# Patient Record
Sex: Female | Born: 1980 | Race: White | Hispanic: No | Marital: Single | Smoking: Current every day smoker
Health system: Southern US, Community
[De-identification: ages and names within clinical notes are randomized; demographics above are authoritative.]

## PROBLEM LIST (undated history)

## (undated) DIAGNOSIS — E785 Hyperlipidemia, unspecified: Secondary | ICD-10-CM

## (undated) DIAGNOSIS — I10 Essential (primary) hypertension: Secondary | ICD-10-CM

## (undated) DIAGNOSIS — D649 Anemia, unspecified: Secondary | ICD-10-CM

## (undated) DIAGNOSIS — M1990 Unspecified osteoarthritis, unspecified site: Secondary | ICD-10-CM

## (undated) DIAGNOSIS — D571 Sickle-cell disease without crisis: Secondary | ICD-10-CM

## (undated) DIAGNOSIS — F419 Anxiety disorder, unspecified: Secondary | ICD-10-CM

## (undated) DIAGNOSIS — G709 Myoneural disorder, unspecified: Secondary | ICD-10-CM

## (undated) DIAGNOSIS — F32A Depression, unspecified: Secondary | ICD-10-CM

## (undated) DIAGNOSIS — K219 Gastro-esophageal reflux disease without esophagitis: Secondary | ICD-10-CM

## (undated) HISTORY — DX: Anxiety disorder, unspecified: F41.9

## (undated) HISTORY — DX: Hyperlipidemia, unspecified: E78.5

## (undated) HISTORY — DX: Anemia, unspecified: D64.9

## (undated) HISTORY — PX: NO PAST SURGERIES: SHX2092

## (undated) HISTORY — DX: Unspecified osteoarthritis, unspecified site: M19.90

## (undated) HISTORY — DX: Myoneural disorder, unspecified: G70.9

## (undated) HISTORY — DX: Sickle-cell disease without crisis: D57.1

## (undated) HISTORY — DX: Gastro-esophageal reflux disease without esophagitis: K21.9

## (undated) HISTORY — DX: Depression, unspecified: F32.A

---
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
## 2002-01-16 ENCOUNTER — Encounter: Payer: Self-pay | Admitting: Emergency Medicine

## 2002-01-16 ENCOUNTER — Emergency Department (HOSPITAL_COMMUNITY): Admission: EM | Admit: 2002-01-16 | Discharge: 2002-01-16 | Payer: Self-pay | Admitting: Emergency Medicine

## 2006-04-13 ENCOUNTER — Emergency Department: Payer: Self-pay | Admitting: Emergency Medicine

## 2006-06-16 ENCOUNTER — Emergency Department: Payer: Self-pay | Admitting: Unknown Physician Specialty

## 2006-10-31 ENCOUNTER — Emergency Department: Payer: Self-pay | Admitting: Unknown Physician Specialty

## 2007-11-24 ENCOUNTER — Emergency Department: Payer: Self-pay | Admitting: Emergency Medicine

## 2008-09-27 ENCOUNTER — Emergency Department: Payer: Self-pay | Admitting: Emergency Medicine

## 2009-08-10 ENCOUNTER — Emergency Department: Payer: Self-pay | Admitting: Emergency Medicine

## 2009-08-12 ENCOUNTER — Inpatient Hospital Stay: Payer: Self-pay | Admitting: Internal Medicine

## 2010-08-12 ENCOUNTER — Emergency Department: Payer: Self-pay | Admitting: Emergency Medicine

## 2010-11-16 ENCOUNTER — Emergency Department: Payer: Self-pay | Admitting: Emergency Medicine

## 2011-01-16 ENCOUNTER — Emergency Department (HOSPITAL_COMMUNITY)
Admission: EM | Admit: 2011-01-16 | Discharge: 2011-01-16 | Disposition: A | Discharge status: Home / Self Care | Payer: Self-pay | Attending: Emergency Medicine | Admitting: Emergency Medicine

## 2011-01-16 DIAGNOSIS — L0291 Cutaneous abscess, unspecified: Secondary | ICD-10-CM | POA: Insufficient documentation

## 2011-01-16 DIAGNOSIS — L0390 Cellulitis, unspecified: Secondary | ICD-10-CM | POA: Insufficient documentation

## 2011-02-23 ENCOUNTER — Emergency Department: Payer: Self-pay | Admitting: Emergency Medicine

## 2011-07-23 ENCOUNTER — Observation Stay: Payer: Self-pay

## 2011-09-29 ENCOUNTER — Observation Stay: Payer: Self-pay | Admitting: Obstetrics and Gynecology

## 2011-09-30 ENCOUNTER — Inpatient Hospital Stay: Payer: Self-pay | Admitting: Obstetrics and Gynecology

## 2011-10-10 ENCOUNTER — Emergency Department: Payer: Self-pay | Admitting: *Deleted

## 2013-01-15 ENCOUNTER — Emergency Department: Payer: Self-pay | Admitting: Emergency Medicine

## 2013-01-28 ENCOUNTER — Emergency Department: Payer: Self-pay | Admitting: Emergency Medicine

## 2013-01-28 LAB — URINALYSIS, COMPLETE
Bacteria: NONE SEEN
Bilirubin,UR: NEGATIVE
Blood: NEGATIVE
Glucose,UR: NEGATIVE mg/dL (ref 0–75)
Ketone: NEGATIVE
Nitrite: NEGATIVE
RBC,UR: 3 /HPF (ref 0–5)
Squamous Epithelial: 4
WBC UR: 35 /HPF (ref 0–5)

## 2013-01-28 LAB — COMPREHENSIVE METABOLIC PANEL WITH GFR
Albumin: 3.5 g/dL (ref 3.4–5.0)
Alkaline Phosphatase: 99 U/L (ref 50–136)
Anion Gap: 6 — ABNORMAL LOW (ref 7–16)
BUN: 7 mg/dL (ref 7–18)
Bilirubin,Total: 1.2 mg/dL — ABNORMAL HIGH (ref 0.2–1.0)
Calcium, Total: 8.6 mg/dL (ref 8.5–10.1)
Chloride: 102 mmol/L (ref 98–107)
Co2: 29 mmol/L (ref 21–32)
Creatinine: 0.61 mg/dL (ref 0.60–1.30)
EGFR (African American): >60
EGFR (Non-African Amer.): >60
Glucose: 84 mg/dL (ref 65–99)
Osmolality: 271 (ref 275–301)
Potassium: 3.3 mmol/L — ABNORMAL LOW (ref 3.5–5.1)
SGOT(AST): 20 U/L (ref 15–37)
SGPT (ALT): 22 U/L (ref 12–78)
Sodium: 137 mmol/L (ref 136–145)
Total Protein: 7.8 g/dL (ref 6.4–8.2)

## 2013-01-28 LAB — CBC
HCT: 38.5 % (ref 35.0–47.0)
HGB: 12.7 g/dL (ref 12.0–16.0)
MCH: 30 pg (ref 26.0–34.0)
MCHC: 33 g/dL (ref 32.0–36.0)
MCV: 91 fL (ref 80–100)
Platelet: 397 x10 3/mm 3 (ref 150–440)
RBC: 4.23 X10 6/mm 3 (ref 3.80–5.20)
RDW: 13.5 % (ref 11.5–14.5)
WBC: 21.4 x10 3/mm 3 — ABNORMAL HIGH (ref 3.6–11.0)

## 2013-01-28 LAB — LIPASE, BLOOD: Lipase: 144 U/L (ref 73–393)

## 2013-01-28 LAB — WET PREP, GENITAL

## 2013-10-12 IMAGING — CT CT ABD-PELV W/ CM
1 of 2 series · 15 of 32 positions shown, 19 images · non-contrast
Comparison: none

REASON FOR EXAM: (1) mid and lower abd pain w/ whhiter count 21,000; (2)
abd pain  No PO CONTRAST
COMMENTS:

PROCEDURE:     CT  - CT ABDOMEN / PELVIS  W  - January 28, 2013  [DATE]
RESULT:     CT abdomen and pelvis dated 01/28/2013.
TECHNIQUE: Helical 3 mm sections were obtained from the lung bases through
the pubic symphysis status post intravenous administration of 85 mL of
[id number].

[Series 2: 3mm soft tissue · axial · 0.65mm/px · z∈[-688,-268]mm · 15 of 154 slices shown, 19 images]
[im 7/154  soft-tissue]
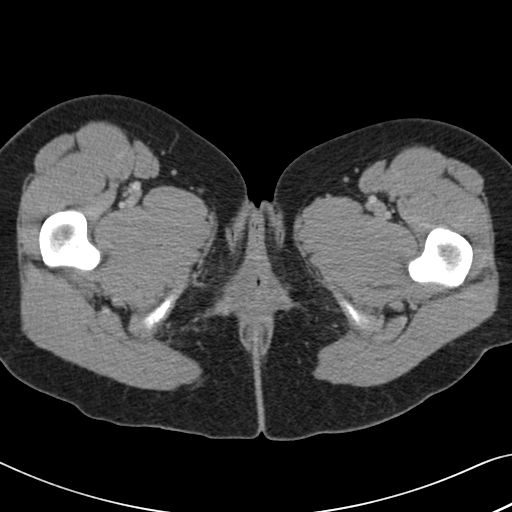
[im 7/154  bone]
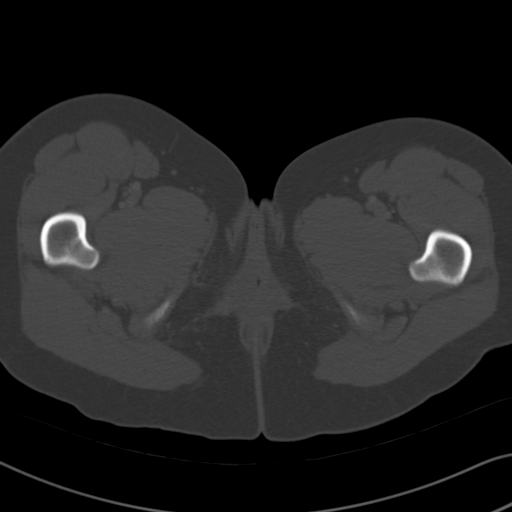
[im 20/154  soft-tissue]
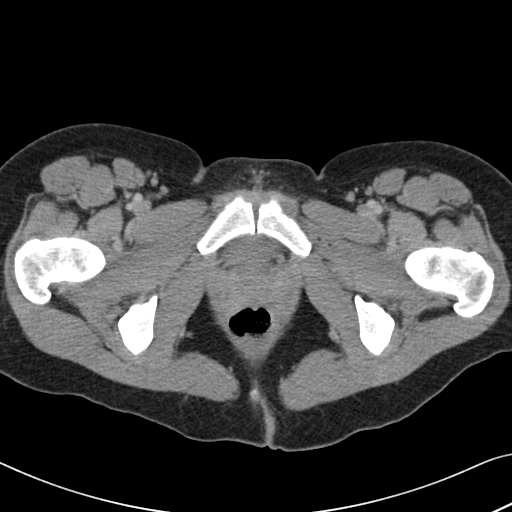
[im 32/154  soft-tissue]
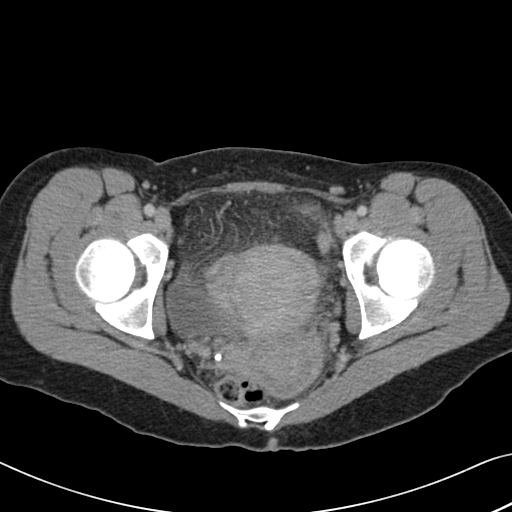
[im 45/154  soft-tissue]
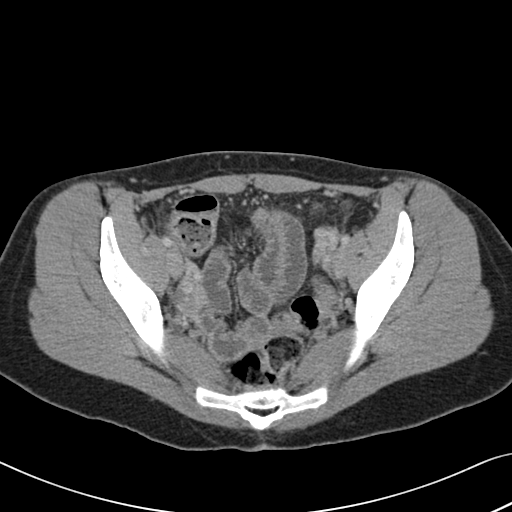
[im 52/154  soft-tissue]
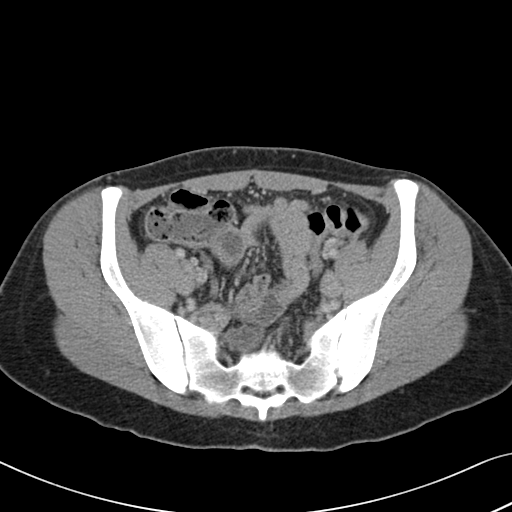
[im 64/154  soft-tissue]
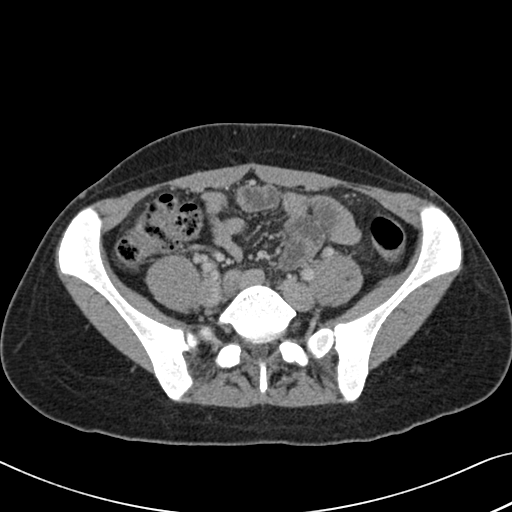
[im 77/154  soft-tissue]
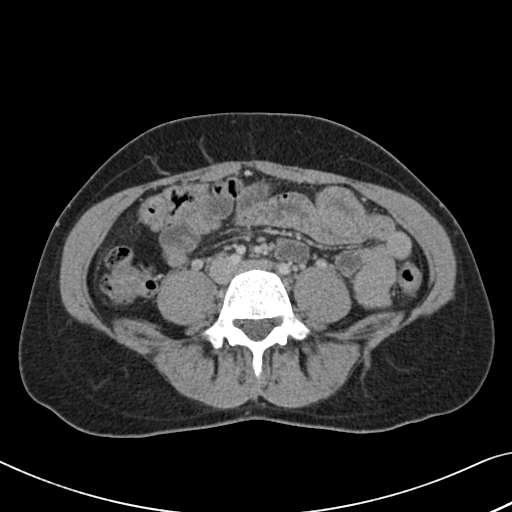
[im 90/154  soft-tissue]
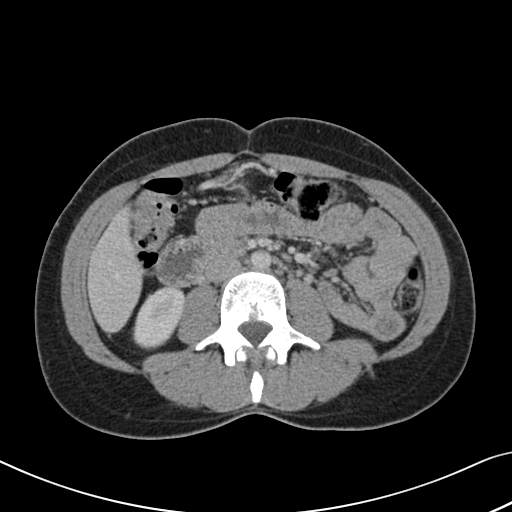
[im 103/154  soft-tissue]
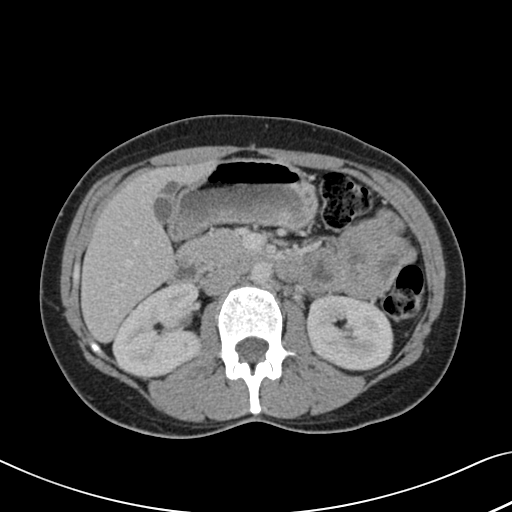
[im 103/154  bone]
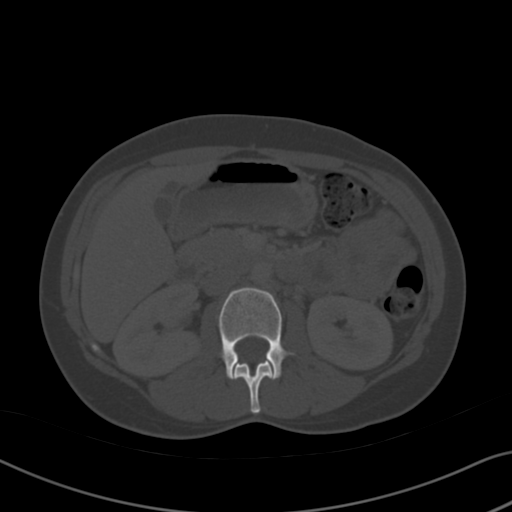
[im 109/154  soft-tissue]
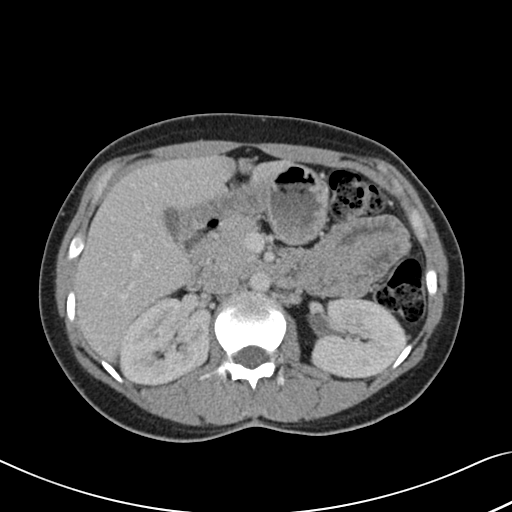
[im 122/154  soft-tissue]
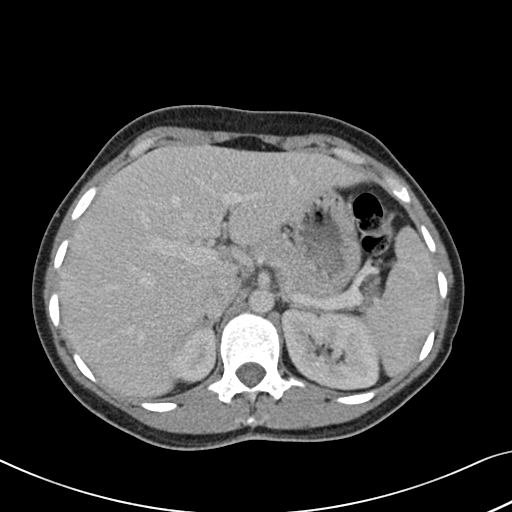
[im 128/154  lung]
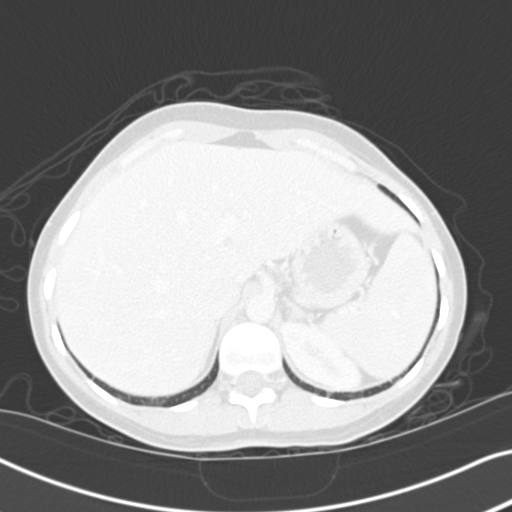
[im 134/154  soft-tissue]
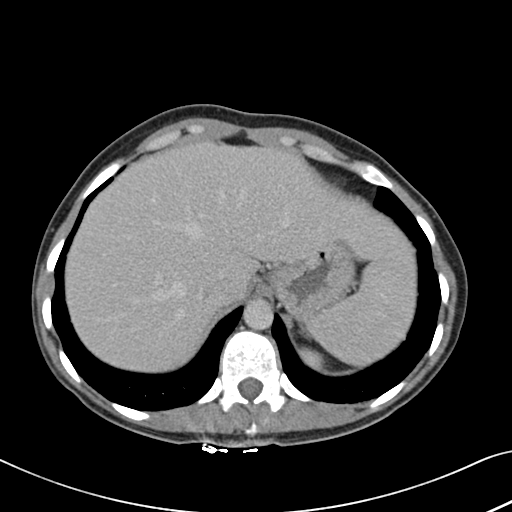
[im 134/154  lung]
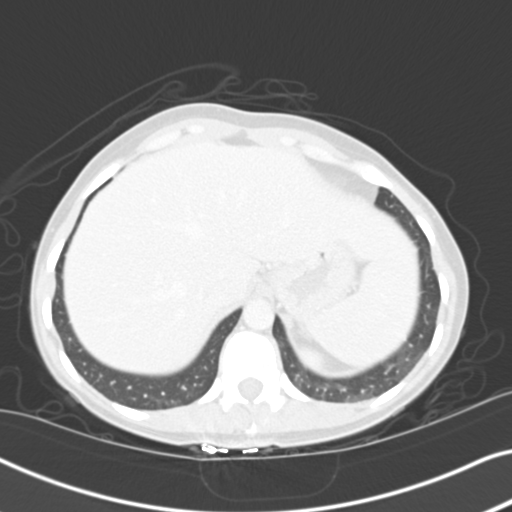
[im 141/154  lung]
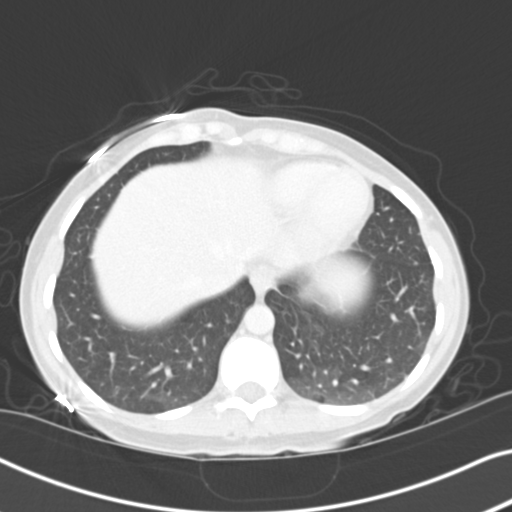
[im 147/154  soft-tissue]
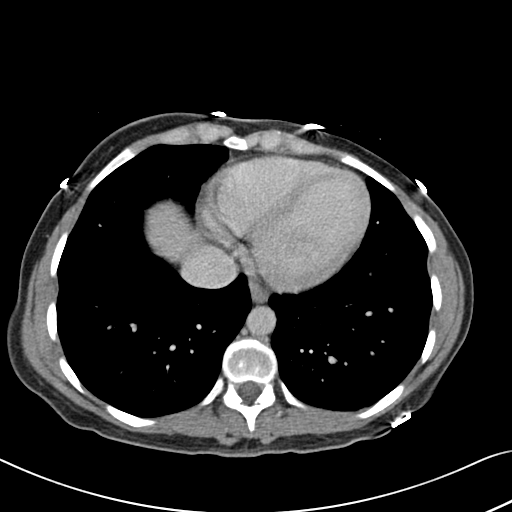
[im 147/154  lung]
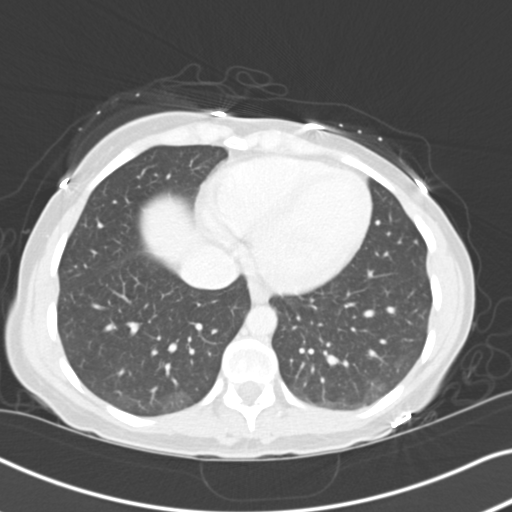

[15 of 32 positions shown; findings below may reference images not displayed]

FINDINGS: Hypoventilation is appreciated within the lung bases.

The liver, spleen, adrenals, pancreas, kidneys are unremarkable. There is no
evidence of bowel obstruction no secondary signs reflecting enteritis,
colitis, diverticulitis, nor appendicitis. Appendix is identified and is
unremarkable. Small 3 to 5 millimeters sized lymph nodes identified within
the mesentery and retroperitoneal regions.

Evaluation of the pelvis demonstrates low attenuating foci adjacent to the
right and left ovaries likely representing bilateral ovarian cysts. There is
mild stranding in the mesenteric fat within the pelvis without definite
drainable loculated fluid collections. No significant free fluid is
appreciated. There is otherwise no evidence of pelvic masses nor adenopathy.
IMPRESSION: Mild stranding within the mesenteric fat the pelvis this is
a nonspecific finding and clinical correlation recommended an etiology such
as PID is a diagnostic consideration if clinically warranted. No loculated
fluid collection appreciated within the pelvis the suggest an abscess no
appreciable free fluid.
2. Nonspecific small lymph nodes within the retroperitoneum mesenteric
regions.
3. No further evidence of obstructive or inflammatory abnormalities.

## 2013-11-30 ENCOUNTER — Emergency Department (HOSPITAL_COMMUNITY)
Admission: EM | Admit: 2013-11-30 | Discharge: 2013-11-30 | Disposition: A | Discharge status: Home / Self Care | Payer: Medicaid Other | Attending: Emergency Medicine | Admitting: Emergency Medicine

## 2013-11-30 ENCOUNTER — Encounter (HOSPITAL_COMMUNITY): Payer: Self-pay | Admitting: Emergency Medicine

## 2013-11-30 DIAGNOSIS — K089 Disorder of teeth and supporting structures, unspecified: Secondary | ICD-10-CM | POA: Insufficient documentation

## 2013-11-30 DIAGNOSIS — F17200 Nicotine dependence, unspecified, uncomplicated: Secondary | ICD-10-CM | POA: Insufficient documentation

## 2013-11-30 DIAGNOSIS — K029 Dental caries, unspecified: Secondary | ICD-10-CM | POA: Insufficient documentation

## 2013-11-30 DIAGNOSIS — R51 Headache: Secondary | ICD-10-CM | POA: Insufficient documentation

## 2013-11-30 DIAGNOSIS — J029 Acute pharyngitis, unspecified: Secondary | ICD-10-CM | POA: Insufficient documentation

## 2013-11-30 MED ORDER — PENICILLIN V POTASSIUM 500 MG PO TABS: 500.0000 mg | ORAL_TABLET | Freq: Three times a day (TID) | ORAL | Status: DC

## 2013-11-30 MED ORDER — HYDROCODONE-ACETAMINOPHEN 5-325 MG PO TABS: 1.0000 | ORAL_TABLET | Freq: Four times a day (QID) | ORAL | Status: DC | PRN

## 2013-11-30 NOTE — ED Notes (Signed)
Pt. reports pain at left upper incisor unrelieved by OTC Motrin .

## 2013-11-30 NOTE — ED Notes (Signed)
PT ambulated with baseline gait; VSS; A&Ox3; no signs of distress; respirations even and unlabored; skin warm and dry; no questions upon discharge.  

## 2013-11-30 NOTE — ED Provider Notes (Signed)
CSN: [id number]     Arrival date & time 11/30/13  2020 History   First MD Initiated Contact with Patient 11/30/13 2040     Chief Complaint  Patient presents with  . Dental Pain   (Consider location/radiation/quality/duration/timing/severity/associated sxs/prior Treatment) HPI  Patient is a 33 year old female who presents today complaining of dental pain x 2 days.  She states that the pain came on suddenly and describes it as throbbing, sharp, and "sensitive," worsened by eating/chewing.  She has tried BC powder, Motrin, and an old prescription of amoxicillin without relief.  Patient admits pain in multiple teeth, sore throat, and headache.  Denies difficulty breathing or swallowing, fever/chills, n/v/d, neck swelling, or ear pain.   Patient notes that she sees a dentist regularly and that 5 months ago their recommendation was to remove all of her teeth due to decay.    History reviewed. No pertinent past medical history. History reviewed. No pertinent past surgical history. No family history on file. History  Substance Use Topics  . Smoking status: Current Every Day Smoker  . Smokeless tobacco: Not on file  . Alcohol Use: No   OB History   Grav Para Term Preterm Abortions TAB SAB Ect Mult Living                 Review of Systems  Constitutional: Negative for fever and chills.  HENT: Positive for dental problem and sore throat. Negative for drooling, ear pain, facial swelling, trouble swallowing and voice change.   Respiratory: Negative for cough and shortness of breath.   Gastrointestinal: Negative for nausea, vomiting, abdominal pain and diarrhea.  Musculoskeletal: Negative for neck pain.  Skin: Negative for color change.  Neurological: Positive for headaches. Negative for speech difficulty.    Allergies  Review of patient's allergies indicates no known allergies.  Home Medications   Current Outpatient Rx  Name  Route  Sig  Dispense  Refill  . amoxicillin (AMOXIL) 500 MG  capsule   Oral   Take 500 mg by mouth once.         . Aspirin-Salicylamide-Caffeine (BC HEADACHE POWDER PO)   Oral   Take 1 packet by mouth daily as needed (for pain).         Marland Kitchen ibuprofen (ADVIL,MOTRIN) 200 MG tablet   Oral   Take 800 mg by mouth daily as needed for mild pain.          BP 138/94  Pulse 70  Temp(Src) 98 F (36.7 C) (Oral)  Resp 17  Wt 134 lb 6 oz (60.952 kg)  SpO2 100%  LMP 11/02/2013 Physical Exam  Constitutional: She is oriented to person, place, and time. She appears well-developed and well-nourished. No distress.  HENT:  Head: Normocephalic.  Mouth/Throat: Uvula is midline, oropharynx is clear and moist and mucous membranes are normal. No posterior oropharyngeal edema.    No trismus.  No evidence of deep tissue infection in mouth  Neck: Neck supple.  Cardiovascular: Normal rate, regular rhythm and normal heart sounds.   Pulmonary/Chest: Effort normal and breath sounds normal.  Lymphadenopathy:    She has no cervical adenopathy.  Neurological: She is alert and oriented to person, place, and time.  Psychiatric: She has a normal mood and affect.    ED Course  Procedures (including critical care time)  10:27 PM Pt with poor dentition presents with dental pain.  No obvious periapical abscess amendable for drainage.  abx and pain meds prescribed.  Dental referral given.   Labs  Review Labs Reviewed - No data to display Imaging Review No results found.  EKG Interpretation   None       MDM   1. Pain due to dental caries    BP 138/94  Pulse 70  Temp(Src) 98 F (36.7 C) (Oral)  Resp 17  Wt 134 lb 6 oz (60.952 kg)  SpO2 100%  LMP 11/02/2013     Fayrene HelperBowie Shamekia Tippets, PA-C 11/30/13 2230

## 2013-11-30 NOTE — ED Provider Notes (Signed)
Medical screening examination/treatment/procedure(s) were performed by non-physician practitioner and as supervising physician I was immediately available for consultation/collaboration.  Megan E Docherty, MD 11/30/13 2306 

## 2013-11-30 NOTE — Discharge Instructions (Signed)
Dental Care and Dentist Visits Dental care supports good overall health. Regular dental visits can also help you avoid dental pain, bleeding, infection, and other more serious health problems in the future. It is important to keep the mouth healthy because diseases in the teeth, gums, and other oral tissues can spread to other areas of the body. Some problems, such as diabetes, heart disease, and pre-term labor have been associated with poor oral health.  See your dentist every 6 months. If you experience emergency problems such as a toothache or broken tooth, go to the dentist right away. If you see your dentist regularly, you may catch problems early. It is easier to be treated for problems in the early stages.  WHAT TO EXPECT AT A DENTIST VISIT  Your dentist will look for many common oral health problems and recommend proper treatment. At your regular dental visit, you can expect:  Gentle cleaning of the teeth and gums. This includes scraping and polishing. This helps to remove the sticky substance around the teeth and gums (plaque). Plaque forms in the mouth shortly after eating. Over time, plaque hardens on the teeth as tartar. If tartar is not removed regularly, it can cause problems. Cleaning also helps remove stains.  Periodic X-rays. These pictures of the teeth and supporting bone will help your dentist assess the health of your teeth.  Periodic fluoride treatments. Fluoride is a natural mineral shown to help strengthen teeth. Fluoride treatmentinvolves applying a fluoride gel or varnish to the teeth. It is most commonly done in children.  Examination of the mouth, tongue, jaws, teeth, and gums to look for any oral health problems, such as:  Cavities (dental caries). This is decay on the tooth caused by plaque, sugar, and acid in the mouth. It is best to catch a cavity when it is small.  Inflammation of the gums caused by plaque buildup (gingivitis).  Problems with the mouth or malformed  or misaligned teeth.  Oral cancer or other diseases of the soft tissues or jaws. KEEP YOUR TEETH AND GUMS HEALTHY For healthy teeth and gums, follow these general guidelines as well as your dentist's specific advice:  Have your teeth professionally cleaned at the dentist every 6 months.  Brush twice daily with a fluoride toothpaste.  Floss your teeth daily.  Ask your dentist if you need fluoride supplements, treatments, or fluoride toothpaste.  Eat a healthy diet. Reduce foods and drinks with added sugar.  Avoid smoking. TREATMENT FOR ORAL HEALTH PROBLEMS If you have oral health problems, treatment varies depending on the conditions present in your teeth and gums.  Your caregiver will most likely recommend good oral hygiene at each visit.  For cavities, gingivitis, or other oral health disease, your caregiver will perform a procedure to treat the problem. This is typically done at a separate appointment. Sometimes your caregiver will refer you to another dental specialist for specific tooth problems or for surgery. SEEK IMMEDIATE DENTAL CARE IF:  You have pain, bleeding, or soreness in the gum, tooth, jaw, or mouth area.  A permanent tooth becomes loose or separated from the gum socket.  You experience a blow or injury to the mouth or jaw area. Document Released: 06/26/2011 Document Revised: 01/06/2012 Document Reviewed: 06/26/2011 [employer] Patient Information 2014 [employer]  

## 2014-08-01 ENCOUNTER — Encounter (HOSPITAL_COMMUNITY): Payer: Self-pay | Admitting: Emergency Medicine

## 2014-08-01 ENCOUNTER — Emergency Department (HOSPITAL_COMMUNITY)
Admission: EM | Admit: 2014-08-01 | Discharge: 2014-08-01 | Disposition: A | Discharge status: Home / Self Care | Payer: Medicaid Other | Attending: Emergency Medicine | Admitting: Emergency Medicine

## 2014-08-01 DIAGNOSIS — Z87891 Personal history of nicotine dependence: Secondary | ICD-10-CM | POA: Diagnosis not present

## 2014-08-01 DIAGNOSIS — Z79899 Other long term (current) drug therapy: Secondary | ICD-10-CM | POA: Insufficient documentation

## 2014-08-01 DIAGNOSIS — F419 Anxiety disorder, unspecified: Secondary | ICD-10-CM | POA: Diagnosis not present

## 2014-08-01 DIAGNOSIS — K047 Periapical abscess without sinus: Secondary | ICD-10-CM

## 2014-08-01 DIAGNOSIS — K088 Other specified disorders of teeth and supporting structures: Secondary | ICD-10-CM | POA: Diagnosis present

## 2014-08-01 DIAGNOSIS — Z792 Long term (current) use of antibiotics: Secondary | ICD-10-CM | POA: Insufficient documentation

## 2014-08-01 DIAGNOSIS — Z791 Long term (current) use of non-steroidal anti-inflammatories (NSAID): Secondary | ICD-10-CM | POA: Diagnosis not present

## 2014-08-01 MED ORDER — AMOXICILLIN 250 MG PO CAPS
500.0000 mg | ORAL_CAPSULE | Freq: Once | ORAL | Status: AC
  Administered 2014-08-01: 500 mg via ORAL
  Filled 2014-08-01: qty 2

## 2014-08-01 MED ORDER — OXYCODONE-ACETAMINOPHEN 5-325 MG PO TABS: 1.0000 | ORAL_TABLET | ORAL | Status: DC | PRN

## 2014-08-01 MED ORDER — AMOXICILLIN 500 MG PO CAPS: 500.0000 mg | ORAL_CAPSULE | Freq: Three times a day (TID) | ORAL | Status: DC

## 2014-08-01 MED ORDER — OXYCODONE-ACETAMINOPHEN 5-325 MG PO TABS
2.0000 | ORAL_TABLET | Freq: Once | ORAL | Status: AC
  Administered 2014-08-01: 2 via ORAL
  Filled 2014-08-01: qty 2

## 2014-08-01 NOTE — Discharge Instructions (Signed)
Dental Abscess A dental abscess is a collection of infected fluid (pus) from a bacterial infection in the inner part of the tooth (pulp). It usually occurs at the end of the tooth's root.  CAUSES   Severe tooth decay.  Trauma to the tooth that allows bacteria to enter into the pulp, such as a broken or chipped tooth. SYMPTOMS   Severe pain in and around the infected tooth.  Swelling and redness around the abscessed tooth or in the mouth or face.  Tenderness.  Pus drainage.  Bad breath.  Bitter taste in the mouth.  Difficulty swallowing.  Difficulty opening the mouth.  Nausea.  Vomiting.  Chills.  Swollen neck glands. DIAGNOSIS   A medical and dental history will be taken.  An examination will be performed by tapping on the abscessed tooth.  X-rays may be taken of the tooth to identify the abscess. TREATMENT The goal of treatment is to eliminate the infection. You may be prescribed antibiotic medicine to stop the infection from spreading. A root canal may be performed to save the tooth. If the tooth cannot be saved, it may be pulled (extracted) and the abscess may be drained.  HOME CARE INSTRUCTIONS  Only take over-the-counter or prescription medicines for pain, fever, or discomfort as directed by your caregiver.  Rinse your mouth (gargle) often with salt water ( tsp salt in 8 oz [250 ml] of warm water) to relieve pain or swelling.  Do not drive after taking pain medicine (narcotics).  Do not apply heat to the outside of your face.  Return to your dentist for further treatment as directed. SEEK MEDICAL CARE IF:  Your pain is not helped by medicine.  Your pain is getting worse instead of better. SEEK IMMEDIATE MEDICAL CARE IF:  You have a fever or persistent symptoms for more than 2-3 days.  You have a fever and your symptoms suddenly get worse.  You have chills or a very bad headache.  You have problems breathing or swallowing.  You have trouble  opening your mouth.  You have swelling in the neck or around the eye. Document Released: 10/14/2005 Document Revised: 07/08/2012 Document Reviewed: 01/22/2011 [facility] Patient Information 2015 [place], [place]. This information is not intended to replace advice given to you by your health care provider. Make sure you discuss any questions you have with your health care provider.    Emergency Department Resource Guide 1) Find a Doctor and Pay Out of Pocket Although you won't have to find out who is covered by your insurance plan, it is a good idea to ask around and get recommendations. You will then need to call the office and see if the doctor you have chosen will accept you as a new patient and what types of options they offer for patients who are self-pay. Some doctors offer discounts or will set up payment plans for their patients who do not have insurance, but you will need to ask so you aren't surprised when you get to your appointment.  2) Contact Your Local Health Department Not all health departments have doctors that can see patients for sick visits, but many do, so it is worth a call to see if yours does. If you don't know where your local health department is, you can check in your phone book. The CDC also has a tool to help you locate your state's health department, and many state websites also have listings of all of their local health departments.  3) Find a  Walk-in Clinic If your illness is not likely to be very severe or complicated, you may want to try a walk in clinic. These are popping up all over the country in pharmacies, drugstores, and shopping centers. They're usually staffed by nurse practitioners or physician assistants that have been trained to treat common illnesses and complaints. They're usually fairly quick and inexpensive. However, if you have serious medical issues or chronic medical problems, these are probably not your best option.  No Primary Care Doctor: - Call  Health Connect at  [phone] - they can help you locate a primary care doctor that  accepts your insurance, provides certain services, etc. - Physician Referral Service- [phone]  Chronic Pain Problems: Organization         Address  Phone   Notes  [facility]  [phone] Patients need to be referred by their primary care doctor.   Medication Assistance: Organization         Address  Phone   Notes  [facility] [address] [place], [place] [address] [phone] --Must be a resident of [facility] -- Must have NO insurance coverage whatsoever (no Medicaid/ Medicare, etc.) -- The pt. MUST have a primary care doctor that directs their care regularly and follows them in the community   MedAssist  [phone]   [employer]  [phone]    Agencies that provide inexpensive medical care: Organization         Address  Phone   Notes  [place]  [phone]   [facility]    [phone]   [facility] [place], [place] [address] [phone]   [place] [address] [place] [phone]   Planned Parenthood    [phone]   [facility]    [phone]   [place] and [place] [address], [place] Phone:  [phone], Fax:  [phone] Hours of Operation:  9 am - 6 pm, M-F.  Also accepts Medicaid/Medicare and self-pay.  [facility] for [place], [address], [place] Phone: [phone], Fax: [phone]. Hours of Operation:  8:30 am - 5:30 pm, M-F.  Also accepts Medicaid and self-pay.  [facility] [place] [address] [place] Phone: [phone]   [place], [place], [place] [phone], Ext. [phone] Mondays & Thursdays: 7-9 AM.  First 15 patients are seen on a first come, first serve  basis.    [place] Providers:  Organization         Address  Phone   Notes  [facility] [address], [place] [phone] Also accepts self-pay patients.  [facility] [address], [place]  [phone]   [place], [address], [place] [phone]   [facility] [address], [place] [phone]   Lucianne Lei [address], [place]   [phone] Only accepts [place] Access [place] patients after they have their name applied to their card.   Self-Pay (no insurance) in [facility]:  Organization         Address  Phone   Notes  Sickle Cell Patients, [facility] [place],  [place] [phone]   [facility] [place] [phone]   [facility] [place]  [place], [place], [place] [phone]   [facility]/Dr. Osei-Bonsu  [address], [place] or [address], [place] [phone] Phone number for both [place] and [place] locations is the same.  [facility] and [facility] [address] [place] [phone]   [facility] [address], [place] or [address] [phone]   [facility] [address], [place] [phone], phone; [phone], fax Sees patients 1st and 3rd Saturday of every month.  Must not qualify for public or private insurance (i.e. Medicaid, Medicare, [place] Health Choice, Veterans' Benefits)  Household income should be no more than 200% of the poverty level The clinic cannot treat you if you are pregnant or think you are pregnant  Sexually transmitted diseases are not treated at the clinic.    Dental Care: Organization         Address  Phone  Notes  [facility] [place] [phone] Accepts children up to age 69 who are enrolled in [place] or [place]; pregnant women with a Medicaid card; and children who have applied for Medicaid or [place] Health Choice, but were declined, whose parents can pay a reduced fee at time of service.  [facility] of [facility]  [address], [place] [phone] Accepts children up to age 27 who are enrolled in [place] or [place]; pregnant women with a Medicaid card; and children who have applied for Medicaid or  Health Choice, but were declined, whose parents can pay a reduced fee at time of service.  [facility] Adult Dental Access PROGRAM  [place] [phone] Patients are seen by appointment only. Walk-ins are not accepted. [place] will see patients 11 years of age and older. Monday - Tuesday (8am-5pm) Most Wednesdays (8:30-5pm) $30 per visit, cash only  [facility] Adult Dental Access PROGRAM  [address], [facility] [phone] Patients are seen by appointment only. Walk-ins are not accepted. [place] will see patients 108 years of age and older. One Wednesday Evening (Monthly: Volunteer Based).  $30 per visit, cash only  [place]  [phone] for adults; Children under age 1, call Graduate Pediatric Dentistry at [phone]. Children aged 80-14, please call [phone] to request a pediatric application.  Dental services are provided in all areas of dental care including fillings, crowns and bridges, complete and partial dentures, implants, gum treatment, root canals, and extractions. Preventive care is also provided. Treatment is provided to both adults and children. Patients are selected via a lottery and there is often a waiting list.   [facility] [address] [place]  [phone]  [email or web address]   [facility] [address] [place], [place] [phone], Ext. [phone] Second and Fourth Thursday of each month, opens at 6:30 AM; Clinic ends at 9 AM.  Patients are seen on a first-come first-served basis, and a limited number are seen during each clinic.   [facility]  [address] [place] [place], [place] [phone]   Eligibility Requirements You must have lived in [place], [place], or [place]  counties for at least the last three months.   You cannot be eligible for state or federal sponsored [employer], including [employer], [place], or [employer].   You generally cannot be eligible for healthcare insurance through your employer.    How to apply: Eligibility screenings are held every Tuesday and Wednesday afternoon from 1:00 pm until 4:00 pm. You do not need an appointment for the interview!  [facility] [address], [place], [place]   Ingenio  [place] Department  [place]  [phone]    Behavioral Health Resources in the Community: Intensive Outpatient Programs Organization         Address  Phone  Notes  [place]. [address] [place], [place] [phone]   [facility] Outpatient [address], [place], [place]   ADS: [facility] [address], [place], [place]   [place] [address],  [place], [place] or [phone]   Substance Abuse Resources Organization         Address  Phone  Notes  Alcohol and Drug Services  [phone]   [place]  [phone]   [facility]   Chinita Pester  [phone]   Residential & Outpatient Substance Abuse Program  [phone]   Psychological Services Organization          Address  Phone  Notes  [facility]  [place]  [phone]   [place] [address] [place] or [phone]    Mobile Crisis Teams Organization         Address  Phone  Notes  [facility], Mobile Crisis Care Unit  [phone]   [facility]  [address] [place], [place]   [place] Levels [address] [place] [phone]    Self-Help/Support Groups Organization         Address  Phone             Notes  [place] [facility] [place] - variety of support groups  [place] Call for more information  Narcotics Anonymous (NA), [facility] [address], [employer] [place]  2 meetings at this location   Special educational needs teacher         Address  Phone  Notes  ASAP Residential Treatment [place],    [place]  [phone]   [facility]  [address], [place] [address]67, [place], [place]   [place], [place] [phone] Admissions: 8am-3pm M-F  Incentives Substance [place] [address]    [place], [place] [phone]   [facility] [address] [place], [place], [place]   [facility] [address]  [place], [place]   [facility] Programs - Intensive Outpatient [address], [place], [place]   [facility] ([place] [place].,  [place], [place] or [phone]   [facility] Services (RTS) [address] [place], [place] Accepts Medicaid  [facility] [place] [address]  [place] [phone] Substance Abuse/Addiction Treatment   [facility] Resources Organization         Address  Phone  Notes  Lexicographer Services  (  [phone]) [phone]   Angie FavaJulie Brannon, PhD [address], [address] [place], [place]   [phone](336) [phone] or [phone]) [phone]   [facility] [place]   [address] [place], [place] [phone](336) [phone]   [facility] [address], [place], [place] [phone](336) [phone] Insurance/Medicaid/sponsorship through [facility]  [facility] Families [address]                                    [place], [place] [phone]) [phone] Therapy/tele-psych/case  [facility] [address]   [place], [place] [phone]) [phone]    Dr. Lolly MustacheArfeen  [phone]) [phone]   [facility] [place]  [employer] [facility] 1) [address], [place] 2) [address], [place] 3)  [address], [place] [phone](336) [phone]) [phone]  [phone](336) [phone]   [facility] Child Abuse Hotline [phone](336) (539)[phone] or [phone](336) [phone] (After Hours)         Complete your entire course of antibiotics as prescribed.  You  may use the oxycodone for pain relief but do not drive within 4 hours of taking as this will make you drowsy.  Avoid applying heat or ice to this abscess area which can worsen your symptoms.  You may use warm salt water swish and spit treatment or half peroxide and water swish and spit after meals to keep this area clean as discussed.  Keep the appointment with your dentist for further management of your symptoms.

## 2014-08-01 NOTE — ED Notes (Signed)
Pain rt mandibular molar for 1 week, Says she needs med "for her nerves"

## 2014-08-01 NOTE — ED Provider Notes (Signed)
CSN: [id number]     Arrival date & time 08/01/14  1624 History  This chart was scribed for non-physician practitioner, Burgess AmorJulie Jennika Ringgold, PA-C,working with Lyanne CoKevin M Campos, MD, by Karle PlumberJennifer Tensley, ED Scribe. This patient was seen in room APFT22/APFT22 and the patient's care was started at 6:08 PM.  Chief Complaint  Patient presents with  . Dental Pain   The history is provided by the patient. No language interpreter was used.   HPI Comments:  Rachel Orr is a 33 y.o. female who presents to the Emergency Department complaining of worsening right lower dental pain for the past one week. Reports associated right-sided facial swelling. She states cold water rinses make the pain better. She reports rinsing with peroxide as well. She states she will make a dentist appointment with [facility] as soon as her Medicaid allows her (stating it should only be a couple days). Pt has taken Ibuprofen 800 mg with the last dose approximately five hours ago with minimal relief. She denies drainage from the site, inability to swallow, otalgia or sore throat.  She also reports increased irritation secondary to having stress at home. She reports she has four children at home and they have been "talking junk" to her and bothering her nerves. She states she wants something to help calm her down. She denies any ideations of hurting the children and does not fear them hurting her.  Denies suicidal ideation.  History reviewed. No pertinent past medical history. History reviewed. No pertinent past surgical history. History reviewed. No pertinent family history. History  Substance Use Topics  . Smoking status: Current Every Day Smoker  . Smokeless tobacco: Not on file  . Alcohol Use: No   OB History   Grav Para Term Preterm Abortions TAB SAB Ect Mult Living                 Review of Systems  HENT: Positive for dental problem. Negative for ear pain, sore throat and trouble swallowing.   Respiratory: Negative for  shortness of breath.   Musculoskeletal: Negative for neck stiffness.  Psychiatric/Behavioral: The patient is nervous/anxious.     Allergies  Review of patient's allergies indicates no known allergies.  Home Medications   Prior to Admission medications   Medication Sig Start Date End Date Taking? Authorizing Provider  amoxicillin (AMOXIL) 500 MG capsule Take 1 capsule (500 mg total) by mouth 3 (three) times daily. 08/01/14   Burgess AmorJulie Kemyah Buser, PA-C  Aspirin-Salicylamide-Caffeine (BC HEADACHE POWDER PO) Take 1 packet by mouth daily as needed (for pain).    Historical Provider, MD  HYDROcodone-acetaminophen (NORCO/VICODIN) 5-325 MG per tablet Take 1 tablet by mouth every 6 (six) hours as needed for moderate pain. 11/30/13   Fayrene HelperBowie Tran, PA-C  ibuprofen (ADVIL,MOTRIN) 200 MG tablet Take 800 mg by mouth daily as needed for mild pain.    Historical Provider, MD  oxyCODONE-acetaminophen (PERCOCET/ROXICET) 5-325 MG per tablet Take 1 tablet by mouth every 4 (four) hours as needed. 08/01/14   Burgess AmorJulie Coretta Leisey, PA-C  penicillin v potassium (VEETID) 500 MG tablet Take 1 tablet (500 mg total) by mouth 3 (three) times daily. 11/30/13   Fayrene HelperBowie Tran, PA-C   Triage Vitals: BP 115/83  Pulse 85  Temp(Src) 99.2 F (37.3 C) (Oral)  Resp 18  Ht 5\' 4"  (1.626 m)  Wt 135 lb (61.236 kg)  BMI 23.16 kg/m2  SpO2 100% Physical Exam  Constitutional: She is oriented to person, place, and time. She appears well-developed and well-nourished. No distress.  HENT:  Head: Normocephalic and atraumatic.  Right Ear: Tympanic membrane, external ear and ear canal normal.  Left Ear: Tympanic membrane, external ear and ear canal normal.  Mouth/Throat: Oropharynx is clear and moist and mucous membranes are normal. No oral lesions. No trismus in the jaw. No dental abscesses.  Significant gingival swelling and erythema around right lower second and third molars. Third molar is pushing into second molar and appears partially erupted, possibly  impacted. No drainage from around the teeth. Mild right-sided edema at mandibular angle.  Eyes: Conjunctivae are normal.  Neck: Normal range of motion. Neck supple.  Cardiovascular: Normal rate and normal heart sounds.   Pulmonary/Chest: Effort normal.  Abdominal: She exhibits no distension.  Musculoskeletal: Normal range of motion.  Lymphadenopathy:    She has no cervical adenopathy.  Neurological: She is alert and oriented to person, place, and time.  Skin: Skin is warm and dry. No erythema.  Psychiatric: She has a normal mood and affect.    ED Course  Procedures (including critical care time) DIAGNOSTIC STUDIES: Oxygen Saturation is 100% on RA, normal by my interpretation.   COORDINATION OF CARE: 6:17 PM- Will provide pt with resource guide to follow up for anxiety issues. Will prescribe pain medication and an antibiotic. Advised pt to follow up with her dentist as soon as possible. Pt verbalizes understanding and agrees to plan.  Medications  amoxicillin (AMOXIL) capsule 500 mg (500 mg Oral Given 08/01/14 1830)  oxyCODONE-acetaminophen (PERCOCET/ROXICET) 5-325 MG per tablet 2 tablet (2 tablets Oral Given 08/01/14 1830)    Labs Review Labs Reviewed - No data to display  Imaging Review No results found.   EKG Interpretation None      MDM   Final diagnoses:  Dental abscess    Amoxil, oxycodone prescribed.  Encouraged f/u with dentist asap.  Referrals given for assistance with home stressors/anxiety.    The patient appears reasonably screened and/or stabilized for discharge and I doubt any other medical condition or other [facility] requiring further screening, evaluation, or treatment in the ED at this time prior to discharge.   I personally performed the services described in this documentation, which was scribed in my presence. The recorded information has been reviewed and is accurate.    Burgess Amor, PA-C 08/03/14 1259

## 2014-08-03 NOTE — ED Provider Notes (Signed)
Medical screening examination/treatment/procedure(s) were performed by non-physician practitioner and as supervising physician I was immediately available for consultation/collaboration.   EKG Interpretation None        Lyanne CoKevin M Tokiko Diefenderfer, MD 08/03/14 1615

## 2014-08-10 DIAGNOSIS — K029 Dental caries, unspecified: Secondary | ICD-10-CM | POA: Diagnosis not present

## 2014-08-10 DIAGNOSIS — Z79899 Other long term (current) drug therapy: Secondary | ICD-10-CM | POA: Diagnosis not present

## 2014-08-10 DIAGNOSIS — K089 Disorder of teeth and supporting structures, unspecified: Secondary | ICD-10-CM | POA: Insufficient documentation

## 2014-08-10 DIAGNOSIS — Z720 Tobacco use: Secondary | ICD-10-CM | POA: Insufficient documentation

## 2014-08-10 DIAGNOSIS — Z792 Long term (current) use of antibiotics: Secondary | ICD-10-CM | POA: Insufficient documentation

## 2014-08-10 NOTE — ED Notes (Signed)
Pt reporting abscessed tooth on right lower side.  Reporting increased pain in jaw and sore throat.

## 2014-08-11 ENCOUNTER — Encounter (HOSPITAL_COMMUNITY): Payer: Self-pay | Admitting: Emergency Medicine

## 2014-08-11 ENCOUNTER — Emergency Department (HOSPITAL_COMMUNITY)
Admission: EM | Admit: 2014-08-11 | Discharge: 2014-08-11 | Disposition: A | Discharge status: Home / Self Care | Payer: Medicaid Other | Attending: Emergency Medicine | Admitting: Emergency Medicine

## 2014-08-11 DIAGNOSIS — K0889 Other specified disorders of teeth and supporting structures: Secondary | ICD-10-CM

## 2014-08-11 MED ORDER — HYDROCODONE-ACETAMINOPHEN 5-325 MG PO TABS: ORAL_TABLET | ORAL | Status: DC

## 2014-08-11 MED ORDER — CLINDAMYCIN HCL 150 MG PO CAPS
150.0000 mg | ORAL_CAPSULE | Freq: Four times a day (QID) | ORAL | Status: DC
Start: 2014-08-11 — End: 2019-11-16

## 2014-08-11 MED ORDER — HYDROCODONE-ACETAMINOPHEN 5-325 MG PO TABS
1.0000 | ORAL_TABLET | Freq: Once | ORAL | Status: AC
  Administered 2014-08-11: 1 via ORAL
  Filled 2014-08-11: qty 1

## 2014-08-11 MED ORDER — CLINDAMYCIN HCL 150 MG PO CAPS
300.0000 mg | ORAL_CAPSULE | Freq: Once | ORAL | Status: AC
  Administered 2014-08-11: 300 mg via ORAL
  Filled 2014-08-11: qty 2

## 2014-08-11 NOTE — ED Provider Notes (Signed)
Medical screening examination/treatment/procedure(s) were performed by non-physician practitioner and as supervising physician I was immediately available for consultation/collaboration.   EKG Interpretation None      Devoria AlbeIva Melody Cirrincione, MD, Armando GangFACEP   Ward GivensIva L Jaiquan Temme, MD 08/11/14 [phone]

## 2014-08-11 NOTE — Discharge Instructions (Signed)
Dental Pain  Toothache is pain in or around a tooth. It may get worse with chewing or with cold or heat.   HOME CARE  · Your dentist may use a numbing medicine during treatment. If so, you may need to avoid eating until the medicine wears off. Ask your dentist about this.  · Only take medicine as told by your dentist or doctor.  · Avoid chewing food near the painful tooth until after all treatment is done. Ask your dentist about this.  GET HELP RIGHT AWAY IF:   · The problem gets worse or new problems appear.  · You have a fever.  · There is redness and puffiness (swelling) of the face, jaw, or neck.  · You cannot open your mouth.  · There is pain in the jaw.  · There is very bad pain that is not helped by medicine.  MAKE SURE YOU:   · Understand these instructions.  · Will watch your condition.  · Will get help right away if you are not doing well or get worse.  Document Released: 04/01/2008 Document Revised: 01/06/2012 Document Reviewed: 04/01/2008  ExitCare® Patient Information ©2015 [employer]. This information is not intended to replace advice given to you by your health care provider. Make sure you discuss any questions you have with your health care provider.

## 2014-08-11 NOTE — ED Provider Notes (Signed)
CSN: [id number]     Arrival date & time 08/10/14  2342 History   First MD Initiated Contact with Patient 08/11/14 0014     Chief Complaint  Patient presents with  . Dental Pain     (Consider location/radiation/quality/duration/timing/severity/associated sxs/prior Treatment)  HPI Rachel Orr is a 33 y.o. female who presents to the Emergency Department complaining of persistent right sided dental pain.  She was seen here 10 days ago for same and reports having continued pain to her jaw and right lower teeth.  She has ran out of her pain medication and states she only has a few antibiotics left.  She states that she believes she may need a "stronger antibiotic".  She is scheduled to see [facility] but the dentist wants the infection cleared before they will extract the tooth.  She denies fever, chills, ear pain or difficulty swallowing.  Pain is improved with ice.     History reviewed. No pertinent past medical history. History reviewed. No pertinent past surgical history. History reviewed. No pertinent family history. History  Substance Use Topics  . Smoking status: Current Every Day Smoker  . Smokeless tobacco: Not on file  . Alcohol Use: No   OB History   Grav Para Term Preterm Abortions TAB SAB Ect Mult Living                 Review of Systems  Constitutional: Negative for fever and appetite change.  HENT: Positive for dental problem. Negative for congestion, facial swelling, sore throat and trouble swallowing.   Eyes: Negative for pain and visual disturbance.  Musculoskeletal: Negative for neck pain and neck stiffness.  Neurological: Negative for dizziness, facial asymmetry and headaches.  Hematological: Negative for adenopathy.  All other systems reviewed and are negative.     Allergies  Review of patient's allergies indicates no known allergies.  Home Medications   Prior to Admission medications   Medication Sig Start Date End Date Taking? Authorizing Provider   amoxicillin (AMOXIL) 500 MG capsule Take 1 capsule (500 mg total) by mouth 3 (three) times daily. 08/01/14   Burgess AmorJulie Idol, PA-C  Aspirin-Salicylamide-Caffeine (BC HEADACHE POWDER PO) Take 1 packet by mouth daily as needed (for pain).    Historical Provider, MD  HYDROcodone-acetaminophen (NORCO/VICODIN) 5-325 MG per tablet Take 1 tablet by mouth every 6 (six) hours as needed for moderate pain. 11/30/13   Fayrene HelperBowie Tran, PA-C  ibuprofen (ADVIL,MOTRIN) 200 MG tablet Take 800 mg by mouth daily as needed for mild pain.    Historical Provider, MD  oxyCODONE-acetaminophen (PERCOCET/ROXICET) 5-325 MG per tablet Take 1 tablet by mouth every 4 (four) hours as needed. 08/01/14   Burgess AmorJulie Idol, PA-C  penicillin v potassium (VEETID) 500 MG tablet Take 1 tablet (500 mg total) by mouth 3 (three) times daily. 11/30/13   Fayrene HelperBowie Tran, PA-C   BP 131/81  Pulse 83  Temp(Src) 98.3 F (36.8 C) (Oral)  Resp 18  Ht 5\' 4"  (1.626 m)  Wt 135 lb (61.236 kg)  BMI 23.16 kg/m2  SpO2 98%  LMP 07/26/2014 Physical Exam  Nursing note and vitals reviewed. Constitutional: She is oriented to person, place, and time. She appears well-developed and well-nourished. No distress.  HENT:  Head: Normocephalic and atraumatic.  Right Ear: Tympanic membrane and ear canal normal.  Left Ear: Tympanic membrane and ear canal normal.  Mouth/Throat: Uvula is midline, oropharynx is clear and moist and mucous membranes are normal. No trismus in the jaw. Dental caries present. No dental  abscesses or uvula swelling.  Widespread dental caries and ttp of the right lower second and third molars.  Third molar appears impacted and partially erupted.  Mild edema of the right mandibular angle.  No obvious dental abscess, trismus, or sublingual abnml.    Neck: Normal range of motion. Neck supple.  Cardiovascular: Normal rate, regular rhythm, normal heart sounds and intact distal pulses.   No murmur heard. Pulmonary/Chest: Effort normal and breath sounds normal. No  respiratory distress.  Musculoskeletal: Normal range of motion.  Lymphadenopathy:    She has no cervical adenopathy.  Neurological: She is alert and oriented to person, place, and time. She exhibits normal muscle tone. Coordination normal.  Skin: Skin is warm and dry.    ED Course  Procedures (including critical care time) Labs Review Labs Reviewed - No data to display  Imaging Review No results found.   EKG Interpretation None      MDM   Final diagnoses:  Pain, dental    Previous ED hart reviewed.  Pt seen here 10 days ago for same and has an upcoming appt with dentist.  Still has small amt of STS at the right mandibular angle and likely impacted molar.  Pt is otherwise well appearing and no concerning sx's for infection to the floor of the mouth or deep structures of the neck.  Appears stable for d/c and advised to f/u with her dentist soon.  Lynsi Dooner L. Trisha Mangleriplett, PA-C 08/11/14 [phone]

## 2018-01-22 ENCOUNTER — Emergency Department (HOSPITAL_COMMUNITY)
Admission: EM | Admit: 2018-01-22 | Discharge: 2018-01-22 | Disposition: A | Discharge status: Home / Self Care | Payer: Self-pay | Attending: Emergency Medicine | Admitting: Emergency Medicine

## 2018-01-22 ENCOUNTER — Encounter (HOSPITAL_COMMUNITY): Payer: Self-pay | Admitting: Emergency Medicine

## 2018-01-22 DIAGNOSIS — R102 Pelvic and perineal pain: Secondary | ICD-10-CM | POA: Insufficient documentation

## 2018-01-22 DIAGNOSIS — Z5321 Procedure and treatment not carried out due to patient leaving prior to being seen by health care provider: Secondary | ICD-10-CM | POA: Insufficient documentation

## 2018-01-22 LAB — URINALYSIS, ROUTINE W REFLEX MICROSCOPIC
Bilirubin Urine: NEGATIVE
Glucose, UA: NEGATIVE mg/dL
Ketones, ur: 80 mg/dL — AB
Nitrite: NEGATIVE
PROTEIN: 30 mg/dL — AB
SPECIFIC GRAVITY, URINE: 1.027 (ref 1.005–1.030)
pH: 5 (ref 5.0–8.0)

## 2018-01-22 LAB — POC URINE PREG, ED: Preg Test, Ur: NEGATIVE

## 2018-01-22 NOTE — ED Triage Notes (Signed)
Pt reports vaginal pain x 3-4 days.  Denies discharge or bleeding.

## 2019-06-02 ENCOUNTER — Ambulatory Visit: Payer: Self-pay | Admitting: Family Medicine

## 2019-06-02 ENCOUNTER — Encounter: Payer: Self-pay | Admitting: Family Medicine

## 2019-06-03 ENCOUNTER — Encounter: Payer: Self-pay | Admitting: Family Medicine

## 2019-10-29 NOTE — L&D Delivery Note (Signed)
Delivery Note Rachel Orr is a 39 y.o. [id number] s/p uncomplicated SVD at [redacted]w[redacted]d. She was admitted for IOL for DFM.   At 2:02 AM a viable female was delivered via Vaginal, Spontaneous (Presentation: Left Occiput Anterior).  APGAR:7,9; weight pending.   Placenta status: Spontaneous, Intact.  Cord: 3 vessels with the following complications: None.  Anesthesia: None Episiotomy: None Lacerations: Labial, hemostatic Suture Repair: n/a Est. Blood Loss (mL): 50  Mom to postpartum.  Baby to Couplet care / Skin to Skin.  Delivery: Called to room and patient was complete and pushing. Head delivered LOA. No nuchal cord present. Shoulder and body delivered in usual fashion. Infant with spontaneous cry, placed on mother's abdomen, dried and stimulated. Cord clamped x 2 after 1-minute delay, and cut by FOB under my direct supervision. Cord blood drawn. Placenta delivered spontaneously with gentle cord traction. Fundus firm with massage. Labia, perineum, vagina, and cervix were inspected, with left labial, hemostatic, not repaired.   Alric Seton, MD OB Fellow, Faculty [facility] 06/25/2020 2:26 AM

## 2019-11-16 ENCOUNTER — Encounter (HOSPITAL_COMMUNITY): Payer: Self-pay

## 2019-11-16 ENCOUNTER — Other Ambulatory Visit: Payer: Self-pay

## 2019-11-16 ENCOUNTER — Emergency Department (HOSPITAL_COMMUNITY)
Admission: EM | Admit: 2019-11-16 | Discharge: 2019-11-16 | Disposition: A | Discharge status: Home / Self Care | Payer: Medicaid Other | Attending: Emergency Medicine | Admitting: Emergency Medicine

## 2019-11-16 DIAGNOSIS — X58XXXA Exposure to other specified factors, initial encounter: Secondary | ICD-10-CM | POA: Diagnosis not present

## 2019-11-16 DIAGNOSIS — Y929 Unspecified place or not applicable: Secondary | ICD-10-CM | POA: Diagnosis not present

## 2019-11-16 DIAGNOSIS — F17210 Nicotine dependence, cigarettes, uncomplicated: Secondary | ICD-10-CM | POA: Diagnosis not present

## 2019-11-16 DIAGNOSIS — Y999 Unspecified external cause status: Secondary | ICD-10-CM | POA: Diagnosis not present

## 2019-11-16 DIAGNOSIS — S0501XA Injury of conjunctiva and corneal abrasion without foreign body, right eye, initial encounter: Secondary | ICD-10-CM | POA: Diagnosis not present

## 2019-11-16 DIAGNOSIS — Y939 Activity, unspecified: Secondary | ICD-10-CM | POA: Insufficient documentation

## 2019-11-16 DIAGNOSIS — S0591XA Unspecified injury of right eye and orbit, initial encounter: Secondary | ICD-10-CM | POA: Diagnosis present

## 2019-11-16 MED ORDER — FLUORESCEIN SODIUM 1 MG OP STRP
1.0000 | ORAL_STRIP | Freq: Once | OPHTHALMIC | Status: AC
  Administered 2019-11-16: 1 via OPHTHALMIC
  Filled 2019-11-16: qty 1

## 2019-11-16 MED ORDER — TOBRAMYCIN 0.3 % OP SOLN
2.0000 [drp] | Freq: Once | OPHTHALMIC | Status: AC
  Administered 2019-11-16: 2 [drp] via OPHTHALMIC
  Filled 2019-11-16: qty 5

## 2019-11-16 NOTE — ED Triage Notes (Signed)
Pt presents to ED with complaints of right eye soreness started last night. Pt states feels like something is in her eye. Pt denies drainage, redness or fever.

## 2019-11-16 NOTE — Discharge Instructions (Addendum)
Return if any problems.

## 2019-11-17 NOTE — ED Provider Notes (Signed)
[facility] EMERGENCY DEPARTMENT Provider Note   CSN: [id number] Arrival date & time: 11/16/19  1304     History Chief Complaint  Patient presents with  . Eye Pain    Rachel Orr is a 39 y.o. female.  The history is provided by the patient. No language interpreter was used.  Eye Pain This is a new problem. The current episode started 2 days ago. The problem occurs constantly. The problem has been gradually worsening. Nothing aggravates the symptoms. Nothing relieves the symptoms. She has tried nothing for the symptoms.  Pt reports she was scraping paint and has had the feeling of getting something in her eye. Pt reports left eye was irritated as well but symptoms in left eye have resolved.  Pt reports right eye is still painful     History reviewed. No pertinent past medical history.  There are no problems to display for this patient.   History reviewed. No pertinent surgical history.   OB History   No obstetric history on file.     Family History  Problem Relation Age of Onset  . Asthma Son     Social History   Tobacco Use  . Smoking status: Current Every Day Smoker    Packs/day: 1.00    Types: Cigarettes  . Smokeless tobacco: Never Used  Substance Use Topics  . Alcohol use: No  . Drug use: No    Home Medications Prior to Admission medications   Medication Sig Start Date End Date Taking? Authorizing Provider  Aspirin-Salicylamide-Caffeine (BC HEADACHE POWDER PO) Take 1 packet by mouth daily as needed (for pain).    [provider]  ibuprofen (ADVIL,MOTRIN) 200 MG tablet Take 800 mg by mouth daily as needed for mild pain.    [provider]    Allergies    Patient has no known allergies.  Review of Systems   Review of Systems  Eyes: Positive for pain.  All other systems reviewed and are negative.   Physical Exam Updated Vital Signs BP 118/73 (BP Location: Right Arm)   Pulse 73   Temp 98.2 F (36.8 C) (Oral)   Resp 16    Ht 5\' 4"  (1.626 m)   Wt 65.8 kg   LMP 10/29/2019   SpO2 99%   BMI 24.89 kg/m   Physical Exam Vitals and nursing note reviewed.  Constitutional:      Appearance: She is well-developed.  HENT:     Head: Normocephalic.  Eyes:     Extraocular Movements: Extraocular movements intact.     Pupils: Pupils are equal, round, and reactive to light.     Comments: Injected right conjunctiva, no foreign body,  Small area of uptake at 3oclock  Pulmonary:     Effort: Pulmonary effort is normal.  Abdominal:     General: There is no distension.  Musculoskeletal:        General: Normal range of motion.     Cervical back: Normal range of motion.  Neurological:     Mental Status: She is alert and oriented to person, place, and time.  Psychiatric:        Mood and Affect: Mood normal.     ED Results / Procedures / Treatments   Labs (all labs ordered are listed, but only abnormal results are displayed) Labs Reviewed - No data to display  EKG None  Radiology No results found.  Procedures Procedures (including critical care time)  Medications Ordered in ED Medications  fluorescein ophthalmic strip 1  strip (1 strip Both Eyes Given 11/16/19 1630)  tobramycin (TOBREX) 0.3 % ophthalmic solution 2 drop (2 drops Right Eye Given 11/16/19 1630)    ED Course  I have reviewed the triage vital signs and the nursing notes.  Pertinent labs & imaging results that were available during my care of the patient were reviewed by me and considered in my medical decision making (see chart for details).    MDM Rules/Calculators/[name]                      MDM: Pt counseled on corneal abrasion.  Pt unable to afford rx.  Pt given tobrex and advised to take tylenol for pain Final Clinical Impression(s) / ED Diagnoses Final diagnoses:  Abrasion of right cornea, initial encounter    Rx / DC Orders ED Discharge Orders    None    An After Visit Summary was printed and given to the patient.    Fransico Meadow, [place] 11/17/19 [place], MD 11/18/19 [phone]

## 2019-11-24 ENCOUNTER — Telehealth: Payer: Self-pay | Admitting: Adult Health

## 2019-11-24 NOTE — Telephone Encounter (Signed)
Tried to reach the patient to remind her of her appointment/restrictions, mailbox not setup. °

## 2019-11-25 ENCOUNTER — Emergency Department (HOSPITAL_COMMUNITY): Payer: Medicaid Other

## 2019-11-25 ENCOUNTER — Encounter: Payer: Self-pay | Admitting: Adult Health

## 2019-11-25 ENCOUNTER — Encounter (HOSPITAL_COMMUNITY): Payer: Self-pay | Admitting: *Deleted

## 2019-11-25 ENCOUNTER — Other Ambulatory Visit: Payer: Self-pay

## 2019-11-25 ENCOUNTER — Emergency Department (HOSPITAL_COMMUNITY)
Admission: EM | Admit: 2019-11-25 | Discharge: 2019-11-25 | Disposition: A | Discharge status: Home / Self Care | Payer: Medicaid Other | Attending: Emergency Medicine | Admitting: Emergency Medicine

## 2019-11-25 DIAGNOSIS — N760 Acute vaginitis: Secondary | ICD-10-CM | POA: Diagnosis not present

## 2019-11-25 DIAGNOSIS — B9689 Other specified bacterial agents as the cause of diseases classified elsewhere: Secondary | ICD-10-CM

## 2019-11-25 DIAGNOSIS — A599 Trichomoniasis, unspecified: Secondary | ICD-10-CM | POA: Diagnosis not present

## 2019-11-25 DIAGNOSIS — O99891 Other specified diseases and conditions complicating pregnancy: Secondary | ICD-10-CM | POA: Diagnosis present

## 2019-11-25 DIAGNOSIS — O23599 Infection of other part of genital tract in pregnancy, unspecified trimester: Secondary | ICD-10-CM | POA: Insufficient documentation

## 2019-11-25 DIAGNOSIS — Z3A08 8 weeks gestation of pregnancy: Secondary | ICD-10-CM | POA: Diagnosis not present

## 2019-11-25 DIAGNOSIS — F17210 Nicotine dependence, cigarettes, uncomplicated: Secondary | ICD-10-CM | POA: Diagnosis not present

## 2019-11-25 DIAGNOSIS — R52 Pain, unspecified: Secondary | ICD-10-CM

## 2019-11-25 DIAGNOSIS — R102 Pelvic and perineal pain: Secondary | ICD-10-CM | POA: Insufficient documentation

## 2019-11-25 DIAGNOSIS — Z79899 Other long term (current) drug therapy: Secondary | ICD-10-CM | POA: Diagnosis not present

## 2019-11-25 LAB — CBC WITH DIFFERENTIAL/PLATELET
Abs Immature Granulocytes: 0.08 10*3/uL — ABNORMAL HIGH (ref 0.00–0.07)
Basophils Absolute: 0.1 10*3/uL (ref 0.0–0.1)
Basophils Relative: 0 %
Eosinophils Absolute: 0.1 10*3/uL (ref 0.0–0.5)
Eosinophils Relative: 1 %
HCT: 43 % (ref 36.0–46.0)
Hemoglobin: 14 g/dL (ref 12.0–15.0)
Immature Granulocytes: 0 %
Lymphocytes Relative: 14 %
Lymphs Abs: 2.6 10*3/uL (ref 0.7–4.0)
MCH: 31.6 pg (ref 26.0–34.0)
MCHC: 32.6 g/dL (ref 30.0–36.0)
MCV: 97.1 fL (ref 80.0–100.0)
Monocytes Absolute: 1.5 10*3/uL — ABNORMAL HIGH (ref 0.1–1.0)
Monocytes Relative: 8 %
Neutro Abs: 13.7 10*3/uL — ABNORMAL HIGH (ref 1.7–7.7)
Neutrophils Relative %: 77 %
Platelets: 377 10*3/uL (ref 150–400)
RBC: 4.43 MIL/uL (ref 3.87–5.11)
RDW: 13.1 % (ref 11.5–15.5)
WBC: 18.1 10*3/uL — ABNORMAL HIGH (ref 4.0–10.5)
nRBC: 0 % (ref 0.0–0.2)

## 2019-11-25 LAB — URINALYSIS, ROUTINE W REFLEX MICROSCOPIC
Bacteria, UA: NONE SEEN
Bilirubin Urine: NEGATIVE
Glucose, UA: NEGATIVE mg/dL
Hgb urine dipstick: NEGATIVE
Ketones, ur: NEGATIVE mg/dL
Nitrite: NEGATIVE
Protein, ur: NEGATIVE mg/dL
Specific Gravity, Urine: 1.024 (ref 1.005–1.030)
pH: 7 (ref 5.0–8.0)

## 2019-11-25 LAB — COMPREHENSIVE METABOLIC PANEL
ALT: 14 U/L (ref 0–44)
AST: 17 U/L (ref 15–41)
Albumin: 4.1 g/dL (ref 3.5–5.0)
Alkaline Phosphatase: 55 U/L (ref 38–126)
Anion gap: 7 (ref 5–15)
BUN: 13 mg/dL (ref 6–20)
CO2: 25 mmol/L (ref 22–32)
Calcium: 9.1 mg/dL (ref 8.9–10.3)
Chloride: 104 mmol/L (ref 98–111)
Creatinine, Ser: 0.76 mg/dL (ref 0.44–1.00)
GFR calc Af Amer: >60 mL/min (ref >60)
GFR calc non Af Amer: >60 mL/min (ref >60)
Glucose, Bld: 87 mg/dL (ref 70–99)
Potassium: 4.4 mmol/L (ref 3.5–5.1)
Sodium: 136 mmol/L (ref 135–145)
Total Bilirubin: 0.7 mg/dL (ref 0.3–1.2)
Total Protein: 7.4 g/dL (ref 6.5–8.1)

## 2019-11-25 LAB — WET PREP, GENITAL
Sperm: NONE SEEN
Yeast Wet Prep HPF POC: NONE SEEN

## 2019-11-25 LAB — POC URINE PREG, ED: Preg Test, Ur: POSITIVE — AB

## 2019-11-25 LAB — HCG, QUANTITATIVE, PREGNANCY: hCG, Beta Chain, Quant, S: 179336 m[IU]/mL — ABNORMAL HIGH (ref <5)

## 2019-11-25 MED ORDER — ACETAMINOPHEN 325 MG PO TABS
650.0000 mg | ORAL_TABLET | Freq: Once | ORAL | Status: AC
  Administered 2019-11-25: 17:00:00 650 mg via ORAL
  Filled 2019-11-25: qty 2

## 2019-11-25 MED ORDER — PRENATAL COMPLETE 14-0.4 MG PO TABS: ORAL_TABLET | ORAL | 2 refills | Status: DC

## 2019-11-25 MED ORDER — METRONIDAZOLE 500 MG PO TABS: 500.0000 mg | ORAL_TABLET | Freq: Two times a day (BID) | ORAL | 0 refills | Status: DC

## 2019-11-25 NOTE — ED Triage Notes (Signed)
Pt with lower abd pain.  Pt with two home pregnancy tests that were positive.  Has irregular periods for past several months and missed one this month.

## 2019-11-25 NOTE — Discharge Instructions (Addendum)
Follow up with OB/GYN.  Your wet prep here today did show bacterial vaginosis and trichomonas.  Take the medications as prescribed.  Your gonorrhea chlamydia test is pending.  You will be notified if this is positive.

## 2019-11-25 NOTE — ED Provider Notes (Signed)
[facility] EMERGENCY DEPARTMENT Provider Note   CSN: [id number] Arrival date & time: 11/25/19  1414   History Chief Complaint  Patient presents with  . Abdominal Pain   Rachel Orr is a 39 y.o. female with no significant past medical history presents for evaluation of lower abdominal pain and positive home pregnancy test.  Patient unsure of her last menstrual cycle due to history of irregular cycles. Patient states she is had intermittent spotting over the last 4 months.  Has had some suprapubic tenderness.  She denies any current vaginal bleeding, pelvic pain.  Dates her positive home pregnancy test was 2 weeks ago.  She is a G5 P4.  She is followed by [facility].  She has not contacted them.  Denies fever, chills, chest pain, shortness of breath, diarrhea, dysuria, vaginal discharge.  Has had some mild nausea.  She is taking prenatal vitamins.  Denies additional aggravating or alleviating factors.  History obtained from patient and past medical records.  No interpreter is used.  HPI     History reviewed. No pertinent past medical history.  There are no problems to display for this patient.   History reviewed. No pertinent surgical history.   OB History   No obstetric history on file.     Family History  Problem Relation Age of Onset  . Asthma Son     Social History   Tobacco Use  . Smoking status: Current Every Day Smoker    Packs/day: 1.00    Types: Cigarettes  . Smokeless tobacco: Never Used  Substance Use Topics  . Alcohol use: No  . Drug use: No    Home Medications Prior to Admission medications   Medication Sig Start Date End Date Taking? Authorizing Provider  Prenatal Vit-Fe Fumarate-FA (PRENATAL MULTIVITAMIN) TABS tablet Take 1 tablet by mouth daily at 12 noon.   Yes [provider]  metroNIDAZOLE (FLAGYL) 500 MG tablet Take 1 tablet (500 mg total) by mouth 2 (two) times daily. 11/25/19   Dinita Migliaccio A, PA-C  Prenatal Vit-Fe  Fumarate-FA (PRENATAL COMPLETE) 14-0.4 MG TABS Take one tablet daily 11/25/19   Yohance Hathorne A, PA-C    Allergies    Patient has no known allergies.  Review of Systems   Review of Systems  Constitutional: Negative.   HENT: Negative.   Respiratory: Negative.   Cardiovascular: Negative.   Gastrointestinal: Positive for abdominal pain and nausea. Negative for abdominal distention, anal bleeding, blood in stool, constipation, diarrhea, rectal pain and vomiting.  Genitourinary: Positive for menstrual problem (Irregular cycles). Negative for decreased urine volume, difficulty urinating, dysuria, flank pain, frequency, hematuria, pelvic pain, urgency, vaginal bleeding, vaginal discharge and vaginal pain.  Musculoskeletal: Negative.   Skin: Negative.   Neurological: Negative.   All other systems reviewed and are negative.   Physical Exam Updated Vital Signs BP (!) 130/94 (BP Location: Right Arm)   Pulse 98   Temp 97.6 F (36.4 C) (Oral)   Resp 14   Ht 5\' 4"  (1.626 m)   Wt 64.4 kg   LMP 10/29/2019 (LMP Unknown) Comment: has irreg. periods, + 2 home preg. tests  SpO2 100%   BMI 24.37 kg/m   Physical Exam Vitals and nursing note reviewed. Exam conducted with a chaperone present.  Constitutional:      General: She is not in acute distress.    Appearance: She is well-developed. She is not ill-appearing or toxic-appearing.  HENT:     Head: Normocephalic and atraumatic.  Mouth/Throat:     Mouth: Mucous membranes are moist.  Eyes:     Pupils: Pupils are equal, round, and reactive to light.  Cardiovascular:     Rate and Rhythm: Normal rate.     Heart sounds: Normal heart sounds.  Pulmonary:     Effort: Pulmonary effort is normal. No respiratory distress.     Breath sounds: Normal breath sounds.  Abdominal:     General: Bowel sounds are normal. There is no distension.     Palpations: Abdomen is soft.     Tenderness: There is abdominal tenderness in the suprapubic area. There  is no right CVA tenderness, left CVA tenderness, guarding or rebound. Negative signs include Murphy's sign.     Hernia: No hernia is present.     Comments: Soft, mild tenderness palpation suprapubic region.  No rebound or guarding.  Genitourinary:    Comments: Robin RN in room to chaperone.  Normal appearing external female genitalia without rashes or lesions, normal vaginal epithelium. Normal appearing cervix without discharge or petechiae. Cervical os is closed. There is no bleeding noted at the os. No Odor. Bimanual: No CMT, nontender.  No palpable adnexal masses or tenderness. Uterus midline and not fixed. Rectovaginal exam was deferred.  No cystocele or rectocele noted. No pelvic lymphadenopathy noted. Wet prep was obtained.  Cultures for gonorrhea and chlamydia collected. Exam performed with chaperone in room. Musculoskeletal:        General: Normal range of motion.     Cervical back: Normal range of motion.     Comments: Moves all 4 extremities without difficulty.  Skin:    General: Skin is warm and dry.     Capillary Refill: Capillary refill takes less than 2 seconds.     Comments: Brisk cap refill.  Neurological:     Mental Status: She is alert.     Gait: Gait is intact.     Comments: Ambulatory without difficulty     ED Results / Procedures / Treatments   Labs (all labs ordered are listed, but only abnormal results are displayed) Labs Reviewed  WET PREP, GENITAL - Abnormal; Notable for the following components:      Result Value   Trich, Wet Prep PRESENT (*)    Clue Cells Wet Prep HPF POC PRESENT (*)    WBC, Wet Prep HPF POC RARE (*)    All other components within normal limits  URINALYSIS, ROUTINE W REFLEX MICROSCOPIC - Abnormal; Notable for the following components:   APPearance CLOUDY (*)    Leukocytes,Ua MODERATE (*)    All other components within normal limits  CBC WITH DIFFERENTIAL/PLATELET - Abnormal; Notable for the following components:   WBC 18.1 (*)     Neutro Abs 13.7 (*)    Monocytes Absolute 1.5 (*)    Abs Immature Granulocytes 0.08 (*)    All other components within normal limits  HCG, QUANTITATIVE, PREGNANCY - Abnormal; Notable for the following components:   hCG, Beta Chain, Quant, S [id number] (*)    All other components within normal limits  POC URINE PREG, ED - Abnormal; Notable for the following components:   Preg Test, Ur POSITIVE (*)    All other components within normal limits  COMPREHENSIVE METABOLIC PANEL  RPR  GC/CHLAMYDIA PROBE AMP ([place]) NOT AT [facility]    EKG None  Radiology US OB LESS THAN 14 WEEKS WITH OB TRANSVAGINAL  Result Date: 11/25/2019 CLINICAL DATA:  39 year old female with pelvic pain for 6 days. Unknown LMP.  Quantitative beta HCG 179,336 today. EXAM: OBSTETRIC <14 WK ULTRASOUND TECHNIQUE: Transabdominal ultrasound was performed for evaluation of the gestation as well as the maternal uterus and adnexal regions. COMPARISON:  None. FINDINGS: Intrauterine gestational sac: Single Yolk sac:  Visible Embryo:  Visible Cardiac Activity: Detected Heart Rate: 165 bpm CRL:   1.8 mm   8 w 2 d                  [place] EDC: 06/30/2020 Subchorionic hemorrhage:  None visualized. Maternal uterus/adnexae: No pelvic free fluid. The right ovary appears normal measuring 3.7 x 2.3 x 3.7 centimeters (volume 18 milliliters). The left ovary appears normal measuring 3.7 x 2.3 x 2.6 centimeters (volume 12 milliliters). IMPRESSION: Single living IUP demonstrated. No acute maternal findings visualized. Electronically Signed   By: Genevie Ann M.D.   On: 11/25/2019 17:52    Procedures Procedures (including critical care time)  Medications Ordered in ED Medications  acetaminophen (TYLENOL) tablet 650 mg (650 mg Oral Given 11/25/19 1655)    ED Course  I have reviewed the triage vital signs and the nursing notes.  Pertinent labs & imaging results that were available during my care of the patient were reviewed by me and considered in my medical  decision making (see chart for details).  39 year old presents for evaluation of lower abd pain. Afebrile, nonseptic, not ill-appearing.  Pain located to suprapubic region.  No urinary symptoms.  Positive home pregnancy test 2 weeks ago.  Unsure last LMP, she has been spotting over the last 4 months.  No pelvic pain, vaginal discharge, vaginal bleeding or concerns for STDs.  Patient G5 P4.  Followed by [facility] however is not followed up with them.  Plan on labs, urine, ultrasound. No vaginal bleeding to need Rhogam  Labs and imaging personally reviewed interpreted. Urine preg test positive, get quant hCG CBC with leukocytosis of 18.1 CMp without electrolyte, renal or liver abnormality Wet prep with with Tric and BV GC/Chla pending RPR pending HIV pending Urine with moderate leuks, nitrite negative, no bacteria [place] with [redacted]w[redacted]d IUP  Patient is nontoxic, nonseptic appearing, in no apparent distress.  Patient's pain and other symptoms adequately managed in emergency department.  Fluid bolus given.  Labs, imaging and vitals reviewed.  Patient does not meet the SIRS or Sepsis criteria.  On repeat exam patient does not have a surgical abdomin and there are no peritoneal signs.  No indication of appendicitis, bowel obstruction, bowel perforation, cholecystitis, diverticulitis, PID or ectopic pregnancy.   Patient to follow-up with OB/GYN.  No need for RhoGam currently.  DC with meds for BV and trichomoniasis.  Also prenatal vitamins.  Discussed return precautions.  Patient voiced understanding agreeable follow-up.  The patient has been appropriately medically screened and/or stabilized in the ED. I have low suspicion for any other emergent medical condition which would require further screening, evaluation or treatment in the ED or require inpatient management.  Patient is hemodynamically stable and in no acute distress.  Patient able to ambulate in department prior to ED.  Evaluation does not  show acute pathology that would require ongoing or additional emergent interventions while in the emergency department or further inpatient treatment.  I have discussed the diagnosis with the patient and answered all questions.  Pain is been managed while in the emergency department and patient has no further complaints prior to discharge.  Patient is comfortable with plan discussed in room and is stable for discharge at this time.  I have discussed strict return precautions for returning to the emergency department.  Patient was encouraged to follow-up with PCP/specialist refer to at discharge.           MDM Rules/Calculators/[name]                       Final Clinical Impression(s) / ED Diagnoses Final diagnoses:  [redacted] weeks gestation of pregnancy  Suprapubic abdominal pain  Trichomonas infection  Bacterial vaginosis in pregnancy    Rx / DC Orders ED Discharge Orders         Ordered    metroNIDAZOLE (FLAGYL) 500 MG tablet  2 times daily     11/25/19 1822    Prenatal Vit-Fe Fumarate-FA (PRENATAL COMPLETE) 14-0.4 MG TABS     11/25/19 1822           Neri Vieyra A, PA-C 11/25/19 1823    Bethann Berkshire, MD 11/29/19 [phone]

## 2019-11-26 LAB — RPR: RPR Ser Ql: NONREACTIVE

## 2019-11-28 NOTE — ED Notes (Signed)
Pt called and reports headaches since starting the flagyl.  Notified Dr. Criss Alvine and instructed pt that she could be revaluated if she needed to and she could take tylenol and drink plenty of fluids.  Pt verbalized understanding.

## 2019-11-29 ENCOUNTER — Telehealth: Payer: Self-pay | Admitting: *Deleted

## 2019-11-29 ENCOUNTER — Encounter: Payer: Self-pay | Admitting: Adult Health

## 2019-11-29 LAB — GC/CHLAMYDIA PROBE AMP (~~LOC~~) NOT AT ARMC
Chlamydia: NEGATIVE
Neisseria Gonorrhea: NEGATIVE

## 2019-11-29 MED ORDER — PROMETHAZINE HCL 25 MG PO TABS: 25.0000 mg | ORAL_TABLET | Freq: Four times a day (QID) | ORAL | 1 refills | Status: DC | PRN

## 2019-11-29 NOTE — Telephone Encounter (Signed)
Has had vomiting about 2 hours after taking flagyl, Will rx phenergan, finish taking flagyl and no sex.

## 2019-11-29 NOTE — Telephone Encounter (Signed)
Patient states she was recently seen in the ER for abdominal pain related to pregnancy and was found to have trich and BV.  She was prescribed Flagyl but states she is unable to keep the mediation down as she vomits each time after taking.  Please advise.

## 2019-11-30 ENCOUNTER — Telehealth: Payer: Self-pay | Admitting: *Deleted

## 2019-11-30 NOTE — Telephone Encounter (Signed)
Attempted to return call to patient to let her know that Pherergan has been sent to her pharmacy. Should finish flagyl and no intercourse until finished.

## 2019-12-04 ENCOUNTER — Emergency Department (HOSPITAL_COMMUNITY)
Admission: EM | Admit: 2019-12-04 | Discharge: 2019-12-04 | Disposition: A | Discharge status: Home / Self Care | Payer: Medicaid Other | Attending: Emergency Medicine | Admitting: Emergency Medicine

## 2019-12-04 ENCOUNTER — Other Ambulatory Visit: Payer: Self-pay

## 2019-12-04 ENCOUNTER — Encounter (HOSPITAL_COMMUNITY): Payer: Self-pay | Admitting: Emergency Medicine

## 2019-12-04 DIAGNOSIS — F17210 Nicotine dependence, cigarettes, uncomplicated: Secondary | ICD-10-CM | POA: Diagnosis not present

## 2019-12-04 DIAGNOSIS — Z3A00 Weeks of gestation of pregnancy not specified: Secondary | ICD-10-CM | POA: Diagnosis not present

## 2019-12-04 DIAGNOSIS — O99331 Smoking (tobacco) complicating pregnancy, first trimester: Secondary | ICD-10-CM | POA: Insufficient documentation

## 2019-12-04 DIAGNOSIS — O09511 Supervision of elderly primigravida, first trimester: Secondary | ICD-10-CM | POA: Diagnosis not present

## 2019-12-04 LAB — WET PREP, GENITAL
Clue Cells Wet Prep HPF POC: NONE SEEN
Sperm: NONE SEEN
Trich, Wet Prep: NONE SEEN
Yeast Wet Prep HPF POC: NONE SEEN

## 2019-12-04 NOTE — ED Provider Notes (Signed)
[facility] Provider Note   CSN: [id number] Arrival date & time: 12/04/19  1710     History Chief Complaint  Patient presents with  . multiple complaints    Rachel Orr is a 39 y.o. female.  Pt reports she was seen here on 1/28 and diagnosed with trich and BV.  Pt reports she is early pregnant and has been vomiting when taking the metronidazole.  Pt's OB?GYn called in phenergan on 2/2.  Pt reports she still vomits after taking antibiotics.  Pt requesting to be rechecked to make sure infection is resolving.  Pt also concerned about making sure baby is okay.          History reviewed. No pertinent past medical history.  There are no problems to display for this patient.   History reviewed. No pertinent surgical history.   OB History    Gravida  1   Para      Term      Preterm      AB      Living        SAB      TAB      Ectopic      Multiple      Live Births              Family History  Problem Relation Age of Onset  . Asthma Son     Social History   Tobacco Use  . Smoking status: Current Every Day Smoker    Packs/day: 1.00    Types: Cigarettes  . Smokeless tobacco: Never Used  Substance Use Topics  . Alcohol use: No  . Drug use: No    Home Medications Prior to Admission medications   Medication Sig Start Date End Date Taking? Authorizing Provider  metroNIDAZOLE (FLAGYL) 500 MG tablet Take 1 tablet (500 mg total) by mouth 2 (two) times daily. 11/25/19   Henderly, Britni A, PA-C  Prenatal Vit-Fe Fumarate-FA (PRENATAL COMPLETE) 14-0.4 MG TABS Take one tablet daily 11/25/19   Henderly, Britni A, PA-C  Prenatal Vit-Fe Fumarate-FA (PRENATAL MULTIVITAMIN) TABS tablet Take 1 tablet by mouth daily at 12 noon.    [provider]  promethazine (PHENERGAN) 25 MG tablet Take 1 tablet (25 mg total) by mouth every 6 (six) hours as needed for nausea or vomiting. 11/29/19   Estill Dooms, NP    Allergies    Patient  has no known allergies.  Review of Systems   Review of Systems  Gastrointestinal: Negative for abdominal pain.  Genitourinary: Negative for vaginal bleeding, vaginal discharge and vaginal pain.  All other systems reviewed and are negative.   Physical Exam Updated Vital Signs BP 138/79 (BP Location: Right Arm)   Pulse 91   Temp 98.4 F (36.9 C) (Oral)   Resp 18   Ht 5\' 4"  (1.626 m)   Wt 65.7 kg   LMP 10/29/2019 (Approximate) Comment: has irreg. periods, + 2 home preg. tests  SpO2 100%   BMI 24.87 kg/m   Physical Exam Vitals reviewed.  HENT:     Head: Normocephalic.  Cardiovascular:     Rate and Rhythm: Normal rate.  Pulmonary:     Effort: Pulmonary effort is normal.  Abdominal:     General: Abdomen is flat.     Palpations: Abdomen is soft.  Genitourinary:    Vagina: No vaginal discharge.     Comments: Scant white discharge  Musculoskeletal:        General: Normal  range of motion.  Skin:    General: Skin is warm.  Neurological:     General: No focal deficit present.     Mental Status: She is alert.  Psychiatric:        Mood and Affect: Mood normal.     ED Results / Procedures / Treatments   Labs (all labs ordered are listed, but only abnormal results are displayed) Labs Reviewed  WET PREP, GENITAL  GC/CHLAMYDIA PROBE AMP ([place]) NOT AT [facility]    EKG None  Radiology No results found.  Procedures Procedures (including critical care time)  Medications Ordered in ED Medications - No data to display  ED Course  I have reviewed the triage vital signs and the nursing notes.  Pertinent labs & imaging results that were available during my care of the patient were reviewed by me and considered in my medical decision making (see chart for details).    MDM Rules/Calculators/[name]                      MDM  Wet prep shows few bacteria. No trichomonas.   No sign of miscarriage.  IUP with fetal heart rate noted on 1/28.  Pt advised to [facility]. Continue prenatal vitamins.  Pt advised she can stop metronidazole.  Final Clinical Impression(s) / ED Diagnoses Final diagnoses:  Primigravida of advanced maternal age in first trimester    Rx / DC Orders ED Discharge Orders    None    An After Visit Summary was printed and given to the patient.    Elson Areas, [place] 12/04/19 Luiz Iron    Mancel Bale, MD 12/04/19 2004

## 2019-12-04 NOTE — ED Triage Notes (Signed)
Pt just found out that she is eight weeks pregnant. She had trichomonas and bv she vomited the medication for that up. She wants to get checked and see if the baby has a heart beat and if she still has the infection

## 2019-12-06 ENCOUNTER — Telehealth: Payer: Self-pay | Admitting: *Deleted

## 2019-12-06 NOTE — Telephone Encounter (Signed)
Patient states she was told her trich was gone when she was in the hospital.  Informed patient it was negative.  Verbalized understanding.

## 2019-12-06 NOTE — Telephone Encounter (Signed)
Pt states that she went to the hospital and they told her the infection is gone. She would like [place] to look at her results and let her know for sure.

## 2019-12-07 LAB — GC/CHLAMYDIA PROBE AMP (~~LOC~~) NOT AT ARMC
Chlamydia: NEGATIVE
Neisseria Gonorrhea: NEGATIVE

## 2019-12-21 ENCOUNTER — Telehealth: Payer: Self-pay | Admitting: Obstetrics and Gynecology

## 2019-12-21 NOTE — Telephone Encounter (Signed)

## 2019-12-22 ENCOUNTER — Ambulatory Visit: Payer: Medicaid Other | Admitting: *Deleted

## 2019-12-22 ENCOUNTER — Other Ambulatory Visit: Payer: Self-pay

## 2019-12-22 ENCOUNTER — Other Ambulatory Visit: Payer: Self-pay | Admitting: Obstetrics and Gynecology

## 2019-12-22 ENCOUNTER — Encounter: Payer: Self-pay | Admitting: Advanced Practice Midwife

## 2019-12-22 ENCOUNTER — Ambulatory Visit (INDEPENDENT_AMBULATORY_CARE_PROVIDER_SITE_OTHER): Payer: Medicaid Other

## 2019-12-22 ENCOUNTER — Ambulatory Visit (INDEPENDENT_AMBULATORY_CARE_PROVIDER_SITE_OTHER): Payer: Medicaid Other | Admitting: Advanced Practice Midwife

## 2019-12-22 VITALS — BP 120/81 | HR 84 | Wt 142.0 lb

## 2019-12-22 DIAGNOSIS — O09521 Supervision of elderly multigravida, first trimester: Secondary | ICD-10-CM | POA: Diagnosis not present

## 2019-12-22 DIAGNOSIS — Z3400 Encounter for supervision of normal first pregnancy, unspecified trimester: Secondary | ICD-10-CM

## 2019-12-22 DIAGNOSIS — O09529 Supervision of elderly multigravida, unspecified trimester: Secondary | ICD-10-CM | POA: Insufficient documentation

## 2019-12-22 DIAGNOSIS — Z3480 Encounter for supervision of other normal pregnancy, unspecified trimester: Secondary | ICD-10-CM | POA: Insufficient documentation

## 2019-12-22 DIAGNOSIS — Z3682 Encounter for antenatal screening for nuchal translucency: Secondary | ICD-10-CM

## 2019-12-22 DIAGNOSIS — O99331 Smoking (tobacco) complicating pregnancy, first trimester: Secondary | ICD-10-CM

## 2019-12-22 DIAGNOSIS — F17200 Nicotine dependence, unspecified, uncomplicated: Secondary | ICD-10-CM | POA: Diagnosis not present

## 2019-12-22 DIAGNOSIS — Z3A12 12 weeks gestation of pregnancy: Secondary | ICD-10-CM | POA: Diagnosis not present

## 2019-12-22 LAB — POCT URINALYSIS DIPSTICK OB
Blood, UA: NEGATIVE
Glucose, UA: NEGATIVE
Ketones, UA: NEGATIVE
Leukocytes, UA: NEGATIVE
Nitrite, UA: NEGATIVE
POC,PROTEIN,UA: NEGATIVE

## 2019-12-22 MED ORDER — BLOOD PRESSURE MONITOR MISC: 0 refills | Status: DC

## 2019-12-22 NOTE — Patient Instructions (Signed)
[facility], I greatly value your feedback.  If you receive a survey following your visit with [place] today, we appreciate you taking the time to fill it out.  Thanks, Philipp Deputy, CNM  [facility] HAS MOVED!!! It is now [facility] ([address] [place], [place] [address]) Entrance located off of [address] Free 24/7 valet parking   Nausea & Vomiting  Have saltine crackers or pretzels by your bed and eat a few bites before you raise your head out of bed in the morning  Eat small frequent meals throughout the day instead of large meals  Drink plenty of fluids throughout the day to stay hydrated, just don't drink a lot of fluids with your meals.  This can make your stomach fill up faster making you feel sick  Do not brush your teeth right after you eat  Products with real ginger are good for nausea, like ginger ale and ginger hard candy Make sure it says made with real ginger!  Sucking on sour candy like lemon heads is also good for nausea  If your prenatal vitamins make you nauseated, take them at night so you will sleep through the nausea  Sea Bands  If you feel like you need medicine for the nausea & vomiting please let us know  If you are unable to keep any fluids or food down please let us know   Constipation  Drink plenty of fluid, preferably water, throughout the day  Eat foods high in fiber such as fruits, vegetables, and grains  Exercise, such as walking, is a good way to keep your bowels regular  Drink warm fluids, especially warm prune juice, or decaf coffee  Eat a 1/2 cup of real oatmeal (not instant), 1/2 cup applesauce, and 1/2-1 cup warm prune juice every day  If needed, you may take Colace (docusate sodium) stool softener once or twice a day to help keep the stool soft.   If you still are having problems with constipation, you may take Miralax once daily as needed to help keep your bowels regular.   Home Blood Pressure  Monitoring for Patients   Your provider has recommended that you check your blood pressure (BP) at least once a week at home. If you do not have a blood pressure cuff at home, one will be provided for you. Contact your provider if you have not received your monitor within 1 week.   Helpful Tips for Accurate Home Blood Pressure Checks  . Don't smoke, exercise, or drink caffeine 30 minutes before checking your BP . Use the restroom before checking your BP (a full bladder can raise your pressure) . Relax in a comfortable upright chair . Feet on the ground . Left arm resting comfortably on a flat surface at the level of your heart . Legs uncrossed . Back supported . Sit quietly and don't talk . Place the cuff on your bare arm . Adjust snuggly, so that only two fingertips can fit between your skin and the top of the cuff . Check 2 readings separated by at least one minute . Keep a log of your BP readings . For a visual, please reference this diagram: http://ccnc.care/bpdiagram  Provider Name: [facility]     Phone: [phone]  Zone 1: ALL CLEAR  Continue to monitor your symptoms:  . BP reading is less than 140 (top number) or less than 90 (bottom number)  . No right upper stomach pain .  No headaches or seeing spots . No feeling nauseated or throwing up . No swelling in face and hands  Zone 2: CAUTION Call your doctor's office for any of the following:  . BP reading is greater than 140 (top number) or greater than 90 (bottom number)  . Stomach pain under your ribs in the middle or right side . Headaches or seeing spots . Feeling nauseated or throwing up . Swelling in face and hands  Zone 3: EMERGENCY  Seek immediate medical care if you have any of the following:  . BP reading is greater than160 (top number) or greater than 110 (bottom number) . Severe headaches not improving with Tylenol . Serious difficulty catching your breath . Any worsening symptoms from Zone 2     First Trimester of Pregnancy The first trimester of pregnancy is from week 1 until the end of week 12 (months 1 through 3). A week after a sperm fertilizes an egg, the egg will implant on the wall of the uterus. This embryo will begin to develop into a baby. Genes from you and your partner are forming the baby. The female genes determine whether the baby is a boy or a girl. At 6-8 weeks, the eyes and face are formed, and the heartbeat can be seen on ultrasound. At the end of 12 weeks, all the baby's organs are formed.  Now that you are pregnant, you will want to do everything you can to have a healthy baby. Two of the most important things are to get good prenatal care and to follow your health care provider's instructions. Prenatal care is all the medical care you receive before the baby's birth. This care will help prevent, find, and treat any problems during the pregnancy and childbirth. BODY CHANGES Your body goes through many changes during pregnancy. The changes vary from woman to woman.   You may gain or lose a couple of pounds at first.  You may feel sick to your stomach (nauseous) and throw up (vomit). If the vomiting is uncontrollable, call your health care provider.  You may tire easily.  You may develop headaches that can be relieved by medicines approved by your health care provider.  You may urinate more often. Painful urination may mean you have a bladder infection.  You may develop heartburn as a result of your pregnancy.  You may develop constipation because certain hormones are causing the muscles that push waste through your intestines to slow down.  You may develop hemorrhoids or swollen, bulging veins (varicose veins).  Your breasts may begin to grow larger and become tender. Your nipples may stick out more, and the tissue that surrounds them (areola) may become darker.  Your gums may bleed and may be sensitive to brushing and flossing.  Dark spots or blotches (chloasma,  mask of pregnancy) may develop on your face. This will likely fade after the baby is born.  Your menstrual periods will stop.  You may have a loss of appetite.  You may develop cravings for certain kinds of food.  You may have changes in your emotions from day to day, such as being excited to be pregnant or being concerned that something may go wrong with the pregnancy and baby.  You may have more vivid and strange dreams.  You may have changes in your hair. These can include thickening of your hair, rapid growth, and changes in texture. Some women also have hair loss during or after pregnancy, or hair that feels dry  or thin. Your hair will most likely return to normal after your baby is born. WHAT TO EXPECT AT YOUR PRENATAL VISITS During a routine prenatal visit:  You will be weighed to make sure you and the baby are growing normally.  Your blood pressure will be taken.  Your abdomen will be measured to track your baby's growth.  The fetal heartbeat will be listened to starting around week 10 or 12 of your pregnancy.  Test results from any previous visits will be discussed. Your health care provider may ask you:  How you are feeling.  If you are feeling the baby move.  If you have had any abnormal symptoms, such as leaking fluid, bleeding, severe headaches, or abdominal cramping.  If you have any questions. Other tests that may be performed during your first trimester include:  Blood tests to find your blood type and to check for the presence of any previous infections. They will also be used to check for low iron levels (anemia) and Rh antibodies. Later in the pregnancy, blood tests for diabetes will be done along with other tests if problems develop.  Urine tests to check for infections, diabetes, or protein in the urine.  An ultrasound to confirm the proper growth and development of the baby.  An amniocentesis to check for possible genetic problems.  Fetal screens for  spina bifida and Down syndrome.  You may need other tests to make sure you and the baby are doing well. HOME CARE INSTRUCTIONS  Medicines  Follow your health care provider's instructions regarding medicine use. Specific medicines may be either safe or unsafe to take during pregnancy.  Take your prenatal vitamins as directed.  If you develop constipation, try taking a stool softener if your health care provider approves. Diet  Eat regular, well-balanced meals. Choose a variety of foods, such as meat or vegetable-based protein, fish, milk and low-fat dairy products, vegetables, fruits, and whole grain breads and cereals. Your health care provider will help you determine the amount of weight gain that is right for you.  Avoid raw meat and uncooked cheese. These carry germs that can cause birth defects in the baby.  Eating four or five small meals rather than three large meals a day may help relieve nausea and vomiting. If you start to feel nauseous, eating a few soda crackers can be helpful. Drinking liquids between meals instead of during meals also seems to help nausea and vomiting.  If you develop constipation, eat more high-fiber foods, such as fresh vegetables or fruit and whole grains. Drink enough fluids to keep your urine clear or pale yellow. Activity and Exercise  Exercise only as directed by your health care provider. Exercising will help you:  Control your weight.  Stay in shape.  Be prepared for labor and delivery.  Experiencing pain or cramping in the lower abdomen or low back is a good sign that you should stop exercising. Check with your health care provider before continuing normal exercises.  Try to avoid standing for long periods of time. Move your legs often if you must stand in one place for a long time.  Avoid heavy lifting.  Wear low-heeled shoes, and practice good posture.  You may continue to have sex unless your health care provider directs you  otherwise. Relief of Pain or Discomfort  Wear a good support bra for breast tenderness.    Take warm sitz baths to soothe any pain or discomfort caused by hemorrhoids. Use hemorrhoid cream if your  health care provider approves.    Rest with your legs elevated if you have leg cramps or low back pain.  If you develop varicose veins in your legs, wear support hose. Elevate your feet for 15 minutes, 3-4 times a day. Limit salt in your diet. Prenatal Care  Schedule your prenatal visits by the twelfth week of pregnancy. They are usually scheduled monthly at first, then more often in the last 2 months before delivery.  Write down your questions. Take them to your prenatal visits.  Keep all your prenatal visits as directed by your health care provider. Safety  Wear your seat belt at all times when driving.  Make a list of emergency phone numbers, including numbers for family, friends, the hospital, and police and fire departments. General Tips  Ask your health care provider for a referral to a local prenatal education class. Begin classes no later than at the beginning of month 6 of your pregnancy.  Ask for help if you have counseling or nutritional needs during pregnancy. Your health care provider can offer advice or refer you to specialists for help with various needs.  Do not use hot tubs, steam rooms, or saunas.  Do not douche or use tampons or scented sanitary pads.  Do not cross your legs for long periods of time.  Avoid cat litter boxes and soil used by cats. These carry germs that can cause birth defects in the baby and possibly loss of the fetus by miscarriage or stillbirth.  Avoid all smoking, herbs, alcohol, and medicines not prescribed by your health care provider. Chemicals in these affect the formation and growth of the baby.  Schedule a dentist appointment. At home, brush your teeth with a soft toothbrush and be gentle when you floss. SEEK MEDICAL CARE IF:   You have  dizziness.  You have mild pelvic cramps, pelvic pressure, or nagging pain in the abdominal area.  You have persistent nausea, vomiting, or diarrhea.  You have a bad smelling vaginal discharge.  You have pain with urination.  You notice increased swelling in your face, hands, legs, or ankles. SEEK IMMEDIATE MEDICAL CARE IF:   You have a fever.  You are leaking fluid from your vagina.  You have spotting or bleeding from your vagina.  You have severe abdominal cramping or pain.  You have rapid weight gain or loss.  You vomit blood or material that looks like coffee grounds.  You are exposed to [place] measles and have never had them.  You are exposed to fifth disease or chickenpox.  You develop a severe headache.  You have shortness of breath.  You have any kind of trauma, such as from a fall or a car accident. Document Released: 10/08/2001 Document Revised: 02/28/2014 Document Reviewed: 08/24/2013 [facility] Patient Information 2015 [place], [place]. This information is not intended to replace advice given to you by your health care provider. Make sure you discuss any questions you have with your health care provider.  Coronavirus (COVID-19) Are you at risk?  Are you at risk for the Coronavirus (COVID-19)?  To be considered HIGH RISK for Coronavirus (COVID-19), you have to meet the following criteria:  . Traveled to [place], [place], [place], [place] or [place]; or in the [place] to [place], [place], [place], or [place]; and have fever, cough, and shortness of breath within the last 2 weeks of travel OR . Been in close contact with a person diagnosed with COVID-19 within the last 2 weeks and  have fever, cough, and shortness of breath . IF YOU DO NOT MEET THESE CRITERIA, YOU ARE CONSIDERED LOW RISK FOR COVID-19.  What to do if you are HIGH RISK for COVID-19?  Marland Kitchen If you are having a medical emergency, call 911. . Seek medical care right away. Before you go to a  doctor's office, urgent care or emergency department, call ahead and tell them about your recent travel, contact with someone diagnosed with COVID-19, and your symptoms. You should receive instructions from your physician's office regarding next steps of care.  . When you arrive at healthcare provider, tell the healthcare staff immediately you have returned from visiting [place], [place], [place], [place] or [place]; or traveled in the [place] to [place], [place], [place], or [place]; in the last two weeks or you have been in close contact with a person diagnosed with COVID-19 in the last 2 weeks.   . Tell the health care staff about your symptoms: fever, cough and shortness of breath. . After you have been seen by a medical provider, you will be either: o Tested for (COVID-19) and discharged home on quarantine except to seek medical care if symptoms worsen, and asked to  - Stay home and avoid contact with others until you get your results (4-5 days)  - Avoid travel on public transportation if possible (such as bus, train, or airplane) or o Sent to the Emergency Department by EMS for evaluation, COVID-19 testing, and possible admission depending on your condition and test results.  What to do if you are LOW RISK for COVID-19?  Reduce your risk of any infection by using the same precautions used for avoiding the common cold or flu:  Marland Kitchen Wash your hands often with soap and warm water for at least 20 seconds.  If soap and water are not readily available, use an alcohol-based hand sanitizer with at least 60% alcohol.  . If coughing or sneezing, cover your mouth and nose by coughing or sneezing into the elbow areas of your shirt or coat, into a tissue or into your sleeve (not your hands). . Avoid shaking hands with others and consider head nods or verbal greetings only. . Avoid touching your eyes, nose, or mouth with unwashed hands.  . Avoid close contact with people who are sick. . Avoid  places or events with large numbers of people in one location, like concerts or sporting events. . Carefully consider travel plans you have or are making. . If you are planning any travel outside or inside the [place], visit the CDC's Travelers' Health webpage for the latest health notices. . If you have some symptoms but not all symptoms, continue to monitor at home and seek medical attention if your symptoms worsen. . If you are having a medical emergency, call 911.   [place] / e-Visit: [email or web address]         [facility]: [facility]: [phone]                   [facility]: [phone]

## 2019-12-22 NOTE — Progress Notes (Signed)
INITIAL OBSTETRICAL VISIT Patient name: Rachel Orr MRN [id number]  Date of birth: [birth year] Chief Complaint:   Initial Prenatal Visit  History of Present Illness:   Rachel Orr is a 39 y.o. G4W1027 Caucasian female at 90w5 by 8wk outside scan with an Estimated Date of Delivery: 06/30/20 being seen today for her initial obstetrical visit.   Her obstetrical history is significant for advanced maternal age; SVB x 4 without complication.  Today she reports minimal nausea, doing well.  Patient's last menstrual period was 10/29/2019 (approximate).  Review of Systems:   Pertinent items are noted in HPI Denies cramping/contractions, leakage of fluid, vaginal bleeding, abnormal vaginal discharge w/ itching/odor/irritation, headaches, visual changes, shortness of breath, chest pain, abdominal pain, severe nausea/vomiting, or problems with urination or bowel movements unless otherwise stated above.  Pertinent History Reviewed:  Reviewed past medical,surgical, social, obstetrical and family history.  Reviewed problem list, medications and allergies. OB History  Gravida Para Term Preterm AB Living  5 4 4     4   SAB TAB Ectopic Multiple Live Births          4    # Outcome Date GA Lbr Len/2nd Weight Sex Delivery Anes PTL Lv  5 Current           4 Term 09/30/11 [redacted]w[redacted]d  8 lb (3.629 kg) M Vag-Spont None N LIV  3 Term 02/07/05 [redacted]w[redacted]d  8 lb (3.629 kg) F Vag-Spont None N LIV  2 Term 02/16/03 [redacted]w[redacted]d  7 lb (3.175 kg) F Vag-Spont Other N LIV  1 Term 11/14/98 [redacted]w[redacted]d  6 lb 13 oz (3.09 kg) F Vag-Spont None N LIV   Physical Assessment:   Vitals:   12/22/19 1528  BP: 120/81  Pulse: 84  Weight: 142 lb (64.4 kg)  Body mass index is 24.37 kg/m.       Physical Examination:  General appearance - well appearing, and in no distress  Mental status - alert, oriented to person, place, and time  Psych:  She has a normal mood and affect  Skin - warm and dry, normal color, no suspicious lesions noted  Chest  - effort normal, all lung fields clear to auscultation bilaterally  Heart - normal rate and regular rhythm  Abdomen - soft, nontender  Extremities:  No swelling or varicosities noted  Pelvic - not indicated (pt wants Pap at next visit)   TODAY'S NT 12/24/19 12+5 wks,measurements c/w dates,crl 56.14 mm,fhr 159 bpm,posterior placenta gr 0,NB present,NT 1.2 mm,normal ovaries   Results for orders placed or performed in visit on 12/22/19 (from the past 24 hour(s))  POC Urinalysis Dipstick OB   Collection Time: 12/22/19  3:44 PM  Result Value Ref Range   Color, UA     Clarity, UA     Glucose, UA Negative Negative   Bilirubin, UA     Ketones, UA neg    Spec Grav, UA     Blood, UA neg    pH, UA     POC,PROTEIN,UA Negative Negative, Trace, Small (1+), Moderate (2+), Large (3+), 4+   Urobilinogen, UA     Nitrite, UA neg    Leukocytes, UA Negative Negative   Appearance     Odor      Assessment & Plan:  1) Low-Risk Pregnancy G5P4004 at [redacted]w[redacted]d with an Estimated Date of Delivery: 07/04/20   2) Initial OB visit  3) AMA  4) Smoker> approx 6 cigs/day; declined [place] Quitline currently    Meds:  Meds  ordered this encounter  Medications  . Blood Pressure Monitor MISC    Sig: For regular home bp monitoring during pregnancy    Dispense:  1 each    Refill:  0    Z34.00    Initial labs obtained Continue prenatal vitamins Reviewed n/v relief measures and warning s/s to report Reviewed recommended weight gain based on pre-gravid BMI Encouraged well-balanced diet Genetic Screening discussed: requested Cystic fibrosis, SMA, Fragile X screening discussed declined Ultrasound discussed; fetal survey: requested CCNC completed>PCM not here, form faxed The nature of [place] for [employer] with multiple MDs and other Advanced Practice Providers was explained to patient; also emphasized that fellows, residents, and students are part of our team. Ordered home bp cuff. Check bp weekly,  let us know if >140/90.   No indications for ASA therapy or early GTT (per uptodate)   Follow-up: Return in about 6 weeks (around 02/02/2020) for [place]: Anatomy, 2nd IT, LROB, in person.   Orders Placed This Encounter  Procedures  . Urine Culture  . GC/Chlamydia Probe Amp  . US OB Comp + 14 Wk  . Integrated 1  . Hemoglobinopathy evaluation  . Hepatitis C antibody  . Obstetric Panel, Including HIV  . MaterniT 21 plus Core, Blood  . Pain Management Screening Profile (10S)  . POC Urinalysis Dipstick OB    Myrtis [facility] 12/22/2019 5:01 PM

## 2019-12-22 NOTE — Progress Notes (Signed)
[place] 12+5 wks,measurements c/w dates,crl 56.14 mm,fhr 159 bpm,posterior placenta gr 0,NB present,NT 1.2 mm,normal ovaries

## 2019-12-24 LAB — PMP SCREEN PROFILE (10S), URINE
Amphetamine Scrn, Ur: NEGATIVE ng/mL
BARBITURATE SCREEN URINE: NEGATIVE ng/mL
BENZODIAZEPINE SCREEN, URINE: NEGATIVE ng/mL
CANNABINOIDS UR QL SCN: NEGATIVE ng/mL
Cocaine (Metab) Scrn, Ur: NEGATIVE ng/mL
Creatinine(Crt), U: 108.4 mg/dL (ref 20.0–300.0)
Methadone Screen, Urine: NEGATIVE ng/mL
OXYCODONE+OXYMORPHONE UR QL SCN: NEGATIVE ng/mL
Opiate Scrn, Ur: NEGATIVE ng/mL
Ph of Urine: 7.2 (ref 4.5–8.9)
Phencyclidine Qn, Ur: NEGATIVE ng/mL
Propoxyphene Scrn, Ur: NEGATIVE ng/mL

## 2019-12-24 LAB — GC/CHLAMYDIA PROBE AMP
Chlamydia trachomatis, NAA: NEGATIVE
Neisseria Gonorrhoeae by PCR: NEGATIVE

## 2019-12-24 LAB — URINE CULTURE

## 2019-12-29 LAB — MATERNIT 21 PLUS CORE, BLOOD
Fetal Fraction: 16
Result (T21): NEGATIVE
Trisomy 13 (Patau syndrome): NEGATIVE
Trisomy 18 (Edwards syndrome): NEGATIVE
Trisomy 21 (Down syndrome): NEGATIVE

## 2019-12-29 LAB — OBSTETRIC PANEL, INCLUDING HIV
Antibody Screen: NEGATIVE
Basophils Absolute: 0.1 10*3/uL (ref 0.0–0.2)
Basos: 1 %
EOS (ABSOLUTE): 0.3 10*3/uL (ref 0.0–0.4)
Eos: 2 %
HIV Screen 4th Generation wRfx: NONREACTIVE
Hematocrit: 39.6 % (ref 34.0–46.6)
Hemoglobin: 13.4 g/dL (ref 11.1–15.9)
Hepatitis B Surface Ag: NEGATIVE
Immature Grans (Abs): 0.1 10*3/uL (ref 0.0–0.1)
Immature Granulocytes: 1 %
Lymphocytes Absolute: 2.9 10*3/uL (ref 0.7–3.1)
Lymphs: 19 %
MCH: 31.9 pg (ref 26.6–33.0)
MCHC: 33.8 g/dL (ref 31.5–35.7)
MCV: 94 fL (ref 79–97)
Monocytes Absolute: 1.1 10*3/uL — ABNORMAL HIGH (ref 0.1–0.9)
Monocytes: 7 %
Neutrophils Absolute: 10.8 10*3/uL — ABNORMAL HIGH (ref 1.4–7.0)
Neutrophils: 70 %
Platelets: 415 10*3/uL (ref 150–450)
RBC: 4.2 x10E6/uL (ref 3.77–5.28)
RDW: 12.5 % (ref 11.7–15.4)
RPR Ser Ql: NONREACTIVE
Rh Factor: POSITIVE
Rubella Antibodies, IGG: 2.03 index (ref >0.99)
WBC: 15.3 10*3/uL — ABNORMAL HIGH (ref 3.4–10.8)

## 2019-12-29 LAB — HEPATITIS C ANTIBODY: Hep C Virus Ab: <0.1 s/co ratio (ref 0.0–0.9)

## 2019-12-29 LAB — INTEGRATED 1
Crown Rump Length: 56.1 mm
Gest. Age on Collection Date: 12 weeks
Maternal Age at EDD: 38.3 yr
Nuchal Translucency (NT): 1.2 mm
Number of Fetuses: 1
PAPP-A Value: 1827.9 ng/mL
Weight: 142 [lb_av]

## 2019-12-29 LAB — HEMOGLOBINOPATHY EVALUATION
HGB C: 0 %
HGB S: 0 %
HGB VARIANT: 0 %
Hemoglobin A2 Quantitation: 2.2 % (ref 1.8–3.2)
Hemoglobin F Quantitation: 0 % (ref 0.0–2.0)
Hgb A: 97.8 % (ref 96.4–98.8)

## 2020-01-10 ENCOUNTER — Other Ambulatory Visit (INDEPENDENT_AMBULATORY_CARE_PROVIDER_SITE_OTHER): Payer: Medicaid Other

## 2020-01-10 ENCOUNTER — Other Ambulatory Visit: Payer: Self-pay

## 2020-01-10 VITALS — BP 126/81 | HR 77 | Ht 64.0 in | Wt 143.8 lb

## 2020-01-10 DIAGNOSIS — N898 Other specified noninflammatory disorders of vagina: Secondary | ICD-10-CM

## 2020-01-10 LAB — POCT URINALYSIS DIPSTICK OB
Blood, UA: NEGATIVE
Glucose, UA: NEGATIVE
Ketones, UA: NEGATIVE
Leukocytes, UA: NEGATIVE
Nitrite, UA: NEGATIVE
POC,PROTEIN,UA: NEGATIVE

## 2020-01-10 NOTE — Progress Notes (Signed)
   NURSE VISIT- UTI SYMPTOMS   SUBJECTIVE:  Rachel Orr is a 39 y.o. P7T0626 female here for UTI symptoms. She is [redacted]w[redacted]d pregnant. She reports vaginal itching.  OBJECTIVE:  BP 126/81 (BP Location: Right Arm, Patient Position: Sitting, Cuff Size: Normal)   Pulse 77   Ht 5\' 4"  (1.626 m)   Wt 143 lb 12.8 oz (65.2 kg)   LMP 10/29/2019 (Approximate) Comment: has irreg. periods, + 2 home preg. tests  BMI 24.68 kg/m   Appears well, in no apparent distress  Results for orders placed or performed in visit on 01/10/20 (from the past 24 hour(s))  POC Urinalysis Dipstick OB   Collection Time: 01/10/20 11:14 AM  Result Value Ref Range   Color, UA     Clarity, UA     Glucose, UA Negative Negative   Bilirubin, UA     Ketones, UA neg    Spec Grav, UA     Blood, UA neg    pH, UA     POC,PROTEIN,UA Negative Negative, Trace, Small (1+), Moderate (2+), Large (3+), 4+   Urobilinogen, UA     Nitrite, UA neg    Leukocytes, UA Negative Negative   Appearance     Odor      ASSESSMENT: Pregnancy [redacted]w[redacted]d with UTI symptoms and negative nitrites  PLAN: Pt didn't have any symptoms now and urine dip was negative.  [redacted]w[redacted]d, CNM, [facility]   Rx sent by provider today: No Urine culture not sent Call or return to clinic prn if these symptoms worsen or fail to improve as anticipated. Follow-up: as scheduled   [facility]  01/10/2020 11:19 AM

## 2020-01-24 ENCOUNTER — Other Ambulatory Visit: Payer: Self-pay

## 2020-01-24 ENCOUNTER — Telehealth: Payer: Self-pay | Admitting: Obstetrics & Gynecology

## 2020-01-24 ENCOUNTER — Telehealth: Payer: Self-pay | Admitting: Women's Health

## 2020-01-24 ENCOUNTER — Encounter: Payer: Self-pay | Admitting: Obstetrics & Gynecology

## 2020-01-24 ENCOUNTER — Ambulatory Visit (INDEPENDENT_AMBULATORY_CARE_PROVIDER_SITE_OTHER): Payer: Medicaid Other | Admitting: Obstetrics & Gynecology

## 2020-01-24 VITALS — BP 110/72 | HR 97 | Wt 142.0 lb

## 2020-01-24 DIAGNOSIS — Z3A17 17 weeks gestation of pregnancy: Secondary | ICD-10-CM | POA: Diagnosis not present

## 2020-01-24 DIAGNOSIS — O2302 Infections of kidney in pregnancy, second trimester: Secondary | ICD-10-CM | POA: Diagnosis not present

## 2020-01-24 DIAGNOSIS — R109 Unspecified abdominal pain: Secondary | ICD-10-CM

## 2020-01-24 LAB — POCT URINALYSIS DIPSTICK OB
Glucose, UA: NEGATIVE
Nitrite, UA: POSITIVE

## 2020-01-24 MED ORDER — CEFTRIAXONE SODIUM 1 G IJ SOLR
1.0000 g | INTRAMUSCULAR | Status: AC
  Administered 2020-01-25: 1 g via INTRAMUSCULAR

## 2020-01-24 MED ORDER — CEFTRIAXONE SODIUM 250 MG IJ SOLR: 1000.0000 mg | Freq: Every day | INTRAMUSCULAR | Status: DC

## 2020-01-24 NOTE — Telephone Encounter (Signed)
Pt states that she has been having pain in her lower right side of her back with nausea and chills. Pt requesting to speak with a nurse. She is scheduled tomorrow at 8:50am for this as well.

## 2020-01-24 NOTE — Telephone Encounter (Signed)
error 

## 2020-01-24 NOTE — Progress Notes (Signed)
Chief Complaint  Patient presents with  . Flank Pain    chills, nausea, cloudy urine      39 y.o. [id number] Patient's last menstrual period was 10/29/2019 (approximate). The current method of family planning is pregnant.  Outpatient Encounter Medications as of 01/24/2020  Medication Sig  . Acetaminophen (TYLENOL PO) Take by mouth as needed.  . Prenatal Vit-Fe Fumarate-FA (PRENATAL COMPLETE) 14-0.4 MG TABS Take one tablet daily  . Blood Pressure Monitor MISC For regular home bp monitoring during pregnancy (Patient not taking: Reported on 01/10/2020)   No facility-administered encounter medications on file as of 01/24/2020.    Subjective Pt with 2 days of painful urination Right flank discomfort Never had pyelo before No definitive fever some chills emesis 2 days ago Past Medical History:  Diagnosis Date  . Anemia     Past Surgical History:  Procedure Laterality Date  . NO PAST SURGERIES      OB History    Gravida  5   Para  4   Term  4   Preterm      AB      Living  4     SAB      TAB      Ectopic      Multiple      Live Births  4           No Known Allergies  Social History   Socioeconomic History  . Marital status: Single    Spouse name: Not on file  . Number of children: Not on file  . Years of education: Not on file  . Highest education level: Not on file  Occupational History  . Not on file  Tobacco Use  . Smoking status: Current Every Day Smoker    Packs/day: 0.25    Types: Cigarettes  . Smokeless tobacco: Never Used  Substance and Sexual Activity  . Alcohol use: No  . Drug use: No  . Sexual activity: Yes    Birth control/protection: None  Other Topics Concern  . Not on file  Social History Narrative  . Not on file   Social Determinants of Health   Financial Resource Strain:   . Difficulty of Paying Living Expenses:   Food Insecurity:   . Worried About Charity fundraiser in the Last Year:   . Arboriculturist in  the Last Year:   Transportation Needs:   . Film/video editor (Medical):   Marland Kitchen Lack of Transportation (Non-Medical):   Physical Activity:   . Days of Exercise per Week:   . Minutes of Exercise per Session:   Stress:   . Feeling of Stress :   Social Connections:   . Frequency of Communication with Friends and Family:   . Frequency of Social Gatherings with Friends and Family:   . Attends Religious Services:   . Active Member of Clubs or Organizations:   . Attends Archivist Meetings:   Marland Kitchen Marital Status:     Family History  Problem Relation Age of Onset  . Asthma Son   . Hypertension Son   . Hypertension Father   . COPD Father   . Hypertension Sister   . Hypertension Paternal Uncle   . Diabetes Paternal Grandmother   . Hypertension Paternal Grandmother   . Asthma Paternal Grandmother   . COPD Paternal Grandmother     Medications:       Current Outpatient Medications:  .  Acetaminophen (TYLENOL PO), Take by mouth as needed., Disp: , Rfl:  .  Prenatal Vit-Fe Fumarate-FA (PRENATAL COMPLETE) 14-0.4 MG TABS, Take one tablet daily, Disp: 60 tablet, Rfl: 2 .  Blood Pressure Monitor MISC, For regular home bp monitoring during pregnancy (Patient not taking: Reported on 01/10/2020), Disp: 1 each, Rfl: 0  Objective Blood pressure 110/72, pulse 97, weight 142 lb (64.4 kg), last menstrual period 10/29/2019.  Mild to moderate flank tendernessFHR 160  Pertinent ROS No burning with urination, frequency or urgency No nausea, vomiting or diarrhea Nor fever chills or other constitutional symptoms   Labs or studies pending    Impression Diagnoses this Encounter::   ICD-10-CM   1. Pyelonephritis affecting pregnancy in second trimester  O23.02 CBC  2. Acute right flank pain  R10.9 POC Urinalysis Dipstick OB    Urine Culture    Established relevant diagnosis(es):   Plan/Recommendations: No orders of the defined types were placed in this encounter.   Labs or  Scans Ordered: Orders Placed This Encounter  Procedures  . Urine Culture  . CBC  . POC Urinalysis Dipstick OB    Management:: >Rocephin 1 gram IM today, repeat tomorrow >Culture her urine >CBC today >  Follow up Return in about 1 day (around 01/25/2020) for follow up at 11am with Dr Despina Hidden.       All questions were answered.

## 2020-01-24 NOTE — Addendum Note (Signed)
Addended by: Sherre Lain A on: 01/24/2020 02:34 PM   Modules accepted: Orders

## 2020-01-24 NOTE — Progress Notes (Signed)
Rocephin 1g given IM in right upper outer glute. Patient tolerated well. To return tomorrow for second dose. No problems or concerns.

## 2020-01-24 NOTE — Telephone Encounter (Signed)
Called paitent back. She is experiencing pain in her right flank with cloudy urine, chills and nausea. Pt scheduled for tomorrow morning but is feeling unwell. Advised patient to come in this afternoon.

## 2020-01-25 ENCOUNTER — Encounter: Payer: Self-pay | Admitting: Obstetrics & Gynecology

## 2020-01-25 ENCOUNTER — Ambulatory Visit (INDEPENDENT_AMBULATORY_CARE_PROVIDER_SITE_OTHER): Payer: Medicaid Other | Admitting: Obstetrics & Gynecology

## 2020-01-25 ENCOUNTER — Encounter: Payer: Medicaid Other | Admitting: Obstetrics & Gynecology

## 2020-01-25 ENCOUNTER — Other Ambulatory Visit: Payer: Self-pay

## 2020-01-25 VITALS — BP 110/62 | HR 105 | Wt 142.0 lb

## 2020-01-25 DIAGNOSIS — Z3A17 17 weeks gestation of pregnancy: Secondary | ICD-10-CM | POA: Diagnosis not present

## 2020-01-25 DIAGNOSIS — O2302 Infections of kidney in pregnancy, second trimester: Secondary | ICD-10-CM

## 2020-01-25 LAB — CBC
Hematocrit: 34.8 % (ref 34.0–46.6)
Hemoglobin: 12 g/dL (ref 11.1–15.9)
MCH: 32.5 pg (ref 26.6–33.0)
MCHC: 34.5 g/dL (ref 31.5–35.7)
MCV: 94 fL (ref 79–97)
Platelets: 360 10*3/uL (ref 150–450)
RBC: 3.69 x10E6/uL — ABNORMAL LOW (ref 3.77–5.28)
RDW: 12.9 % (ref 11.7–15.4)
WBC: 18.8 10*3/uL — ABNORMAL HIGH (ref 3.4–10.8)

## 2020-01-25 MED ORDER — CEPHALEXIN 500 MG PO CAPS: 500.0000 mg | ORAL_CAPSULE | Freq: Four times a day (QID) | ORAL | 0 refills | Status: DC

## 2020-01-25 NOTE — Progress Notes (Signed)
Rocephin 1g given IM in left upper outer quadrant. Patient tolerated well.

## 2020-01-25 NOTE — Progress Notes (Signed)
Patient ID: Rachel Orr, female   DOB: [birth year], 39 y.o.   MRN: [id number]   [facility] PREGNANCY VISIT Patient name: Rachel Orr MRN [id number]  Date of birth: 1980/12/15 Chief Complaint:   Follow-up  History of Present Illness:   Rachel Orr is a 39 y.o. [id number] female at [redacted]w[redacted]d with an Estimated Date of Delivery: 06/30/20 being seen today for ongoing management of a high-risk pregnancy complicated by pyelonephritis.  Today she reports no complaints. Contractions: Not present. Vag. Bleeding: None.   . denies leaking of fluid.  Review of Systems:   Pertinent items are noted in HPI Denies abnormal vaginal discharge w/ itching/odor/irritation, headaches, visual changes, shortness of breath, chest pain, abdominal pain, severe nausea/vomiting, or problems with urination or bowel movements unless otherwise stated above. Pertinent History Reviewed:  Reviewed past medical,surgical, social, obstetrical and family history.  Reviewed problem list, medications and allergies. Physical Assessment:   Vitals:   01/25/20 1020  BP: 110/62  Pulse: (!) 105  Weight: 142 lb (64.4 kg)  Body mass index is 24.37 kg/m.           Physical Examination:   General appearance: alert, well appearing, and in no distress  Mental status: alert, oriented to person, place, and time  Skin: warm & dry   Extremities: Edema: None    Cardiovascular: normal heart rate noted  Respiratory: normal respiratory effort, no distress  Abdomen: gravid, soft, non-tender  Pelvic: Cervical exam deferred         Fetal Status:          Fetal Surveillance Testing today:    Chaperone: n/a    Results for orders placed or performed in visit on 01/24/20 (from the past 24 hour(s))  POC Urinalysis Dipstick OB   Collection Time: 01/24/20  1:34 PM  Result Value Ref Range   Color, UA     Clarity, UA     Glucose, UA Negative Negative   Bilirubin, UA     Ketones, UA mod    Spec Grav, UA     Blood, UA small    pH, UA     POC,PROTEIN,UA Small (1+) Negative, Trace, Small (1+), Moderate (2+), Large (3+), 4+   Urobilinogen, UA     Nitrite, UA pos    Leukocytes, UA Moderate (2+) (A) Negative   Appearance     Odor    CBC   Collection Time: 01/24/20  2:35 PM  Result Value Ref Range   WBC 18.8 (H) 3.4 - 10.8 x10E3/uL   RBC 3.69 (L) 3.77 - 5.28 x10E6/uL   Hemoglobin 12.0 11.1 - 15.9 g/dL   Hematocrit 06.3 01.6 - 46.6 %   MCV 94 79 - 97 fL   MCH 32.5 26.6 - 33.0 pg   MCHC 34.5 31.5 - 35.7 g/dL   RDW 01.0 93.2 - 35.5 %   Platelets 360 150 - 450 x10E3/uL    Assessment & Plan:  1) High-risk pregnancy G5P4004 at [redacted]w[redacted]d with an Estimated Date of Delivery: 06/30/20   2) Mild pyelonephritis being managed with outpatient IM Rocephin ,     Meds:  Meds ordered this encounter  Medications  . cephALEXin (KEFLEX) 500 MG capsule    Sig: Take 1 capsule (500 mg total) by mouth 4 (four) times daily.    Dispense:  28 capsule    Refill:  0    Labs/procedures today: IM rocephin  Treatment Plan:  Rocephin 2nd injection today, repeat next 2 days as well, then  start keflex 500 QID, culture pending,   Reviewed:  labor symptoms and general obstetric precautions including but not limited to vaginal bleeding, contractions, leaking of fluid and fetal movement were reviewed in detail with the patient.  All questions were answered.  home bp cuff. Rx faxed to . Check bp weekly, let us know if >140/90.   Follow-up: Return for come back tomorrow and Thursday for nursing visit only for Rocephin 1 gram each day.  No orders of the defined types were placed in this encounter.  Mertie Clause Kollin Udell  01/25/2020 11:01 AM

## 2020-01-26 ENCOUNTER — Ambulatory Visit (INDEPENDENT_AMBULATORY_CARE_PROVIDER_SITE_OTHER): Payer: Medicaid Other

## 2020-01-26 VITALS — Ht 64.0 in | Wt 139.4 lb

## 2020-01-26 DIAGNOSIS — Z3A17 17 weeks gestation of pregnancy: Secondary | ICD-10-CM | POA: Diagnosis not present

## 2020-01-26 DIAGNOSIS — Z3480 Encounter for supervision of other normal pregnancy, unspecified trimester: Secondary | ICD-10-CM

## 2020-01-26 DIAGNOSIS — O2302 Infections of kidney in pregnancy, second trimester: Secondary | ICD-10-CM | POA: Diagnosis not present

## 2020-01-26 DIAGNOSIS — N10 Acute tubulo-interstitial nephritis: Secondary | ICD-10-CM | POA: Diagnosis not present

## 2020-01-26 DIAGNOSIS — O09521 Supervision of elderly multigravida, first trimester: Secondary | ICD-10-CM

## 2020-01-26 LAB — URINE CULTURE

## 2020-01-26 MED ORDER — CEFTRIAXONE SODIUM 1 G IJ SOLR
1.0000 g | INTRAMUSCULAR | Status: DC
  Administered 2020-01-26 – 2020-01-27 (×2): 1 g via INTRAMUSCULAR

## 2020-01-26 MED ORDER — CEFTRIAXONE SODIUM 250 MG IJ SOLR: 1.0000 g | Freq: Once | INTRAMUSCULAR | Status: DC

## 2020-01-26 NOTE — Progress Notes (Signed)
Fetal Heart Tones checked 160.

## 2020-01-26 NOTE — Progress Notes (Signed)
   NURSE VISIT- INJECTION  SUBJECTIVE:  Rachel Orr is a 39 y.o. C1E7517 female here for a Rocephin for treatment for pyelonephritis. She is [redacted]w[redacted]d pregnant.   OBJECTIVE:  Ht 5\' 4"  (1.626 m)   Wt 139 lb 6.4 oz (63.2 kg)   LMP 10/29/2019 (Approximate) Comment: has irreg. periods, + 2 home preg. tests  Breastfeeding Unknown   BMI 23.93 kg/m   Appears well, in no apparent distress  Injection administered in: Left upper quad. gluteus  Meds ordered this encounter  Medications  . cefTRIAXone (ROCEPHIN) injection 1 g  . cefTRIAXone (ROCEPHIN) injection 1 g    Order Specific Question:   Antibiotic Indication:    Answer:   UTI    ASSESSMENT: Pregnancy [redacted]w[redacted]d Rocephin for treatment for pyelonephritis PLAN: Follow-up: as needed   [redacted]w[redacted]d  01/26/2020 3:03 PM

## 2020-01-27 ENCOUNTER — Other Ambulatory Visit: Payer: Self-pay

## 2020-01-27 ENCOUNTER — Ambulatory Visit (INDEPENDENT_AMBULATORY_CARE_PROVIDER_SITE_OTHER): Payer: Medicaid Other

## 2020-01-27 DIAGNOSIS — Z3480 Encounter for supervision of other normal pregnancy, unspecified trimester: Secondary | ICD-10-CM | POA: Diagnosis not present

## 2020-01-27 NOTE — Progress Notes (Signed)
   NURSE VISIT- INJECTION  SUBJECTIVE:  Rachel Orr is a 39 y.o. [id number] female here for Rocephin 1 gm}. She 17+[redacted] weeks pregnant.   OBJECTIVE:  BP 105/69 (BP Location: Left Arm, Patient Position: Sitting, Cuff Size: Normal)   Pulse 94   Wt 138 lb (62.6 kg)   LMP 10/29/2019 (Approximate) Comment: has irreg. periods, + 2 home preg. tests  BMI 23.69 kg/m   Appears well, in no apparent distress  Injection administered in: Left upper quad. gluteus  No orders of the defined types were placed in this encounter.   ASSESSMENT: Pregnancy [redacted]w[redacted]d Rocephin for treatment for pyelonephritis PLAN: Follow-up: as needed   Rennis Petty  01/27/2020 2:35 PM

## 2020-02-02 ENCOUNTER — Other Ambulatory Visit: Payer: Medicaid Other

## 2020-02-02 ENCOUNTER — Encounter: Payer: Self-pay | Admitting: Advanced Practice Midwife

## 2020-02-02 ENCOUNTER — Other Ambulatory Visit: Payer: Self-pay

## 2020-02-02 ENCOUNTER — Encounter: Payer: Medicaid Other | Admitting: Advanced Practice Midwife

## 2020-02-02 ENCOUNTER — Ambulatory Visit (INDEPENDENT_AMBULATORY_CARE_PROVIDER_SITE_OTHER): Payer: Medicaid Other

## 2020-02-02 ENCOUNTER — Ambulatory Visit (INDEPENDENT_AMBULATORY_CARE_PROVIDER_SITE_OTHER): Payer: Medicaid Other | Admitting: Advanced Practice Midwife

## 2020-02-02 VITALS — BP 122/82 | HR 86 | Wt 144.0 lb

## 2020-02-02 DIAGNOSIS — O99332 Smoking (tobacco) complicating pregnancy, second trimester: Secondary | ICD-10-CM | POA: Diagnosis not present

## 2020-02-02 DIAGNOSIS — Z3A18 18 weeks gestation of pregnancy: Secondary | ICD-10-CM

## 2020-02-02 DIAGNOSIS — O2302 Infections of kidney in pregnancy, second trimester: Secondary | ICD-10-CM

## 2020-02-02 DIAGNOSIS — O09522 Supervision of elderly multigravida, second trimester: Secondary | ICD-10-CM

## 2020-02-02 DIAGNOSIS — O321XX0 Maternal care for breech presentation, not applicable or unspecified: Secondary | ICD-10-CM

## 2020-02-02 DIAGNOSIS — F17210 Nicotine dependence, cigarettes, uncomplicated: Secondary | ICD-10-CM | POA: Diagnosis not present

## 2020-02-02 DIAGNOSIS — Z1379 Encounter for other screening for genetic and chromosomal anomalies: Secondary | ICD-10-CM

## 2020-02-02 DIAGNOSIS — Z331 Pregnant state, incidental: Secondary | ICD-10-CM

## 2020-02-02 DIAGNOSIS — Z3480 Encounter for supervision of other normal pregnancy, unspecified trimester: Secondary | ICD-10-CM

## 2020-02-02 DIAGNOSIS — Z1389 Encounter for screening for other disorder: Secondary | ICD-10-CM

## 2020-02-02 DIAGNOSIS — Z3400 Encounter for supervision of normal first pregnancy, unspecified trimester: Secondary | ICD-10-CM

## 2020-02-02 LAB — POCT URINALYSIS DIPSTICK OB
Blood, UA: NEGATIVE
Glucose, UA: NEGATIVE
Ketones, UA: NEGATIVE
Leukocytes, UA: NEGATIVE
Nitrite, UA: NEGATIVE
POC,PROTEIN,UA: NEGATIVE

## 2020-02-02 MED ORDER — PANTOPRAZOLE SODIUM 20 MG PO TBEC: 20.0000 mg | DELAYED_RELEASE_TABLET | Freq: Every day | ORAL | 5 refills | Status: DC

## 2020-02-02 NOTE — Patient Instructions (Signed)
Rachel Orr, I greatly value your feedback.  If you receive a survey following your visit with [place] today, we appreciate you taking the time to fill it out.  Thanks, Philipp Deputy, CNM  [facility] ([address] [place], [place] [address]) Entrance C, located off of [address] [employer] [address] valet parking  Go to [email or web address] to register for FREE online childbirth classes  [place] Pediatricians/Family Doctors:  [facility] [phone]            [facility] [phone]                 [facility] [phone] (usually not accepting new patients unless you have family there already, you are always welcome to call and ask)       [facility] Department [phone]       [facility] Pediatricians/Family Doctors:   [facility]: [phone]  [facility]: [phone]  [facility]: [phone]  [facility] Doctors:   [facility]: [phone]   [facility]: [phone]  [facility] Doctors:  [facility]: [phone]    Home Blood Pressure Monitoring for Patients   Your provider has recommended that you check your blood pressure (BP) at least once a week at home. If you do not have a blood pressure cuff at home, one will be provided for you. Contact your provider if you have not received your monitor within 1 week.   Helpful Tips for Accurate Home Blood Pressure Checks  . Don't smoke, exercise, or drink caffeine 30 minutes before checking your BP . Use the restroom before checking your BP (a full bladder can raise your pressure) . Relax in a comfortable upright chair . Feet on the ground . Left arm resting comfortably on a flat surface at the level of your heart . Legs uncrossed . Back supported . Sit quietly and don't talk . Place the cuff on your bare arm . Adjust snuggly, so that only  two fingertips can fit between your skin and the top of the cuff . Check 2 readings separated by at least one minute . Keep a log of your BP readings . For a visual, please reference this diagram: http://ccnc.care/bpdiagram  Provider Name: [facility]     Phone: [phone]  Zone 1: ALL CLEAR  Continue to monitor your symptoms:  . BP reading is less than 140 (top number) or less than 90 (bottom number)  . No right upper stomach pain . No headaches or seeing spots . No feeling nauseated or throwing up . No swelling in face and hands  Zone 2: CAUTION Call your doctor's office for any of the following:  . BP reading is greater than 140 (top number) or greater than 90 (bottom number)  . Stomach pain under your ribs in the middle or right side . Headaches or seeing spots . Feeling nauseated or throwing up . Swelling in face and hands  Zone 3: EMERGENCY  Seek immediate medical care if you have any of the following:  . BP reading is greater than160 (top number) or greater than 110 (bottom number) . Severe headaches not improving with Tylenol . Serious difficulty catching your breath . Any worsening symptoms from Zone 2     Second Trimester of Pregnancy The second trimester is from week 14 through week 27 (months 4 through 6). The second trimester is often a time when you feel your best.  Your body has adjusted to being pregnant, and you begin to feel better physically. Usually, morning sickness has lessened or quit completely, you may have more energy, and you may have an increase in appetite. The second trimester is also a time when the fetus is growing rapidly. At the end of the sixth month, the fetus is about 9 inches long and weighs about 1 pounds. You will likely begin to feel the baby move (quickening) between 16 and 20 weeks of pregnancy. Body changes during your second trimester Your body continues to go through many changes during your second trimester. The changes vary  from woman to woman.  Your weight will continue to increase. You will notice your lower abdomen bulging out.  You may begin to get stretch marks on your hips, abdomen, and breasts.  You may develop headaches that can be relieved by medicines. The medicines should be approved by your health care provider.  You may urinate more often because the fetus is pressing on your bladder.  You may develop or continue to have heartburn as a result of your pregnancy.  You may develop constipation because certain hormones are causing the muscles that push waste through your intestines to slow down.  You may develop hemorrhoids or swollen, bulging veins (varicose veins).  You may have back pain. This is caused by: ? Weight gain. ? Pregnancy hormones that are relaxing the joints in your pelvis. ? A shift in weight and the muscles that support your balance.  Your breasts will continue to grow and they will continue to become tender.  Your gums may bleed and may be sensitive to brushing and flossing.  Dark spots or blotches (chloasma, mask of pregnancy) may develop on your face. This will likely fade after the baby is born.  A dark line from your belly button to the pubic area (linea nigra) may appear. This will likely fade after the baby is born.  You may have changes in your hair. These can include thickening of your hair, rapid growth, and changes in texture. Some women also have hair loss during or after pregnancy, or hair that feels dry or thin. Your hair will most likely return to normal after your baby is born.  What to expect at prenatal visits During a routine prenatal visit:  You will be weighed to make sure you and the fetus are growing normally.  Your blood pressure will be taken.  Your abdomen will be measured to track your baby's growth.  The fetal heartbeat will be listened to.  Any test results from the previous visit will be discussed.  Your health care provider may ask  you:  How you are feeling.  If you are feeling the baby move.  If you have had any abnormal symptoms, such as leaking fluid, bleeding, severe headaches, or abdominal cramping.  If you are using any tobacco products, including cigarettes, chewing tobacco, and electronic cigarettes.  If you have any questions.  Other tests that may be performed during your second trimester include:  Blood tests that check for: ? Low iron levels (anemia). ? High blood sugar that affects pregnant women (gestational diabetes) between 14 and 28 weeks. ? Rh antibodies. This is to check for a protein on red blood cells (Rh factor).  Urine tests to check for infections, diabetes, or protein in the urine.  An ultrasound to confirm the proper growth and development of the baby.  An amniocentesis to check for possible genetic problems.  Fetal screens  for spina bifida and Down syndrome.  HIV (human immunodeficiency virus) testing. Routine prenatal testing includes screening for HIV, unless you choose not to have this test.  Follow these instructions at home: Medicines  Follow your health care provider's instructions regarding medicine use. Specific medicines may be either safe or unsafe to take during pregnancy.  Take a prenatal vitamin that contains at least 600 micrograms (mcg) of folic acid.  If you develop constipation, try taking a stool softener if your health care provider approves. Eating and drinking  Eat a balanced diet that includes fresh fruits and vegetables, whole grains, good sources of protein such as meat, eggs, or tofu, and low-fat dairy. Your health care provider will help you determine the amount of weight gain that is right for you.  Avoid raw meat and uncooked cheese. These carry germs that can cause birth defects in the baby.  If you have low calcium intake from food, talk to your health care provider about whether you should take a daily calcium supplement.  Limit foods that  are high in fat and processed sugars, such as fried and sweet foods.  To prevent constipation: ? Drink enough fluid to keep your urine clear or pale yellow. ? Eat foods that are high in fiber, such as fresh fruits and vegetables, whole grains, and beans. Activity  Exercise only as directed by your health care provider. Most women can continue their usual exercise routine during pregnancy. Try to exercise for 30 minutes at least 5 days a week. Stop exercising if you experience uterine contractions.  Avoid heavy lifting, wear low heel shoes, and practice good posture.  A sexual relationship may be continued unless your health care provider directs you otherwise. Relieving pain and discomfort  Wear a good support bra to prevent discomfort from breast tenderness.  Take warm sitz baths to soothe any pain or discomfort caused by hemorrhoids. Use hemorrhoid cream if your health care provider approves.  Rest with your legs elevated if you have leg cramps or low back pain.  If you develop varicose veins, wear support hose. Elevate your feet for 15 minutes, 3-4 times a day. Limit salt in your diet. Prenatal Care  Write down your questions. Take them to your prenatal visits.  Keep all your prenatal visits as told by your health care provider. This is important. Safety  Wear your seat belt at all times when driving.  Make a list of emergency phone numbers, including numbers for family, friends, the hospital, and police and fire departments. General instructions  Ask your health care provider for a referral to a local prenatal education class. Begin classes no later than the beginning of month 6 of your pregnancy.  Ask for help if you have counseling or nutritional needs during pregnancy. Your health care provider can offer advice or refer you to specialists for help with various needs.  Do not use hot tubs, steam rooms, or saunas.  Do not douche or use tampons or scented sanitary  pads.  Do not cross your legs for long periods of time.  Avoid cat litter boxes and soil used by cats. These carry germs that can cause birth defects in the baby and possibly loss of the fetus by miscarriage or stillbirth.  Avoid all smoking, herbs, alcohol, and unprescribed drugs. Chemicals in these products can affect the formation and growth of the baby.  Do not use any products that contain nicotine or tobacco, such as cigarettes and e-cigarettes. If you need help quitting,  ask your health care provider.  Visit your dentist if you have not gone yet during your pregnancy. Use a soft toothbrush to brush your teeth and be gentle when you floss. Contact a health care provider if:  You have dizziness.  You have mild pelvic cramps, pelvic pressure, or nagging pain in the abdominal area.  You have persistent nausea, vomiting, or diarrhea.  You have a bad smelling vaginal discharge.  You have pain when you urinate. Get help right away if:  You have a fever.  You are leaking fluid from your vagina.  You have spotting or bleeding from your vagina.  You have severe abdominal cramping or pain.  You have rapid weight gain or weight loss.  You have shortness of breath with chest pain.  You notice sudden or extreme swelling of your face, hands, ankles, feet, or legs.  You have not felt your baby move in over an hour.  You have severe headaches that do not go away when you take medicine.  You have vision changes. Summary  The second trimester is from week 14 through week 27 (months 4 through 6). It is also a time when the fetus is growing rapidly.  Your body goes through many changes during pregnancy. The changes vary from woman to woman.  Avoid all smoking, herbs, alcohol, and unprescribed drugs. These chemicals affect the formation and growth your baby.  Do not use any tobacco products, such as cigarettes, chewing tobacco, and e-cigarettes. If you need help quitting, ask your  health care provider.  Contact your health care provider if you have any questions. Keep all prenatal visits as told by your health care provider. This is important. This information is not intended to replace advice given to you by your health care provider. Make sure you discuss any questions you have with your health care provider. Document Released: 10/08/2001 Document Revised: 03/21/2016 Document Reviewed: 12/15/2012 Elsevier Interactive Patient Education  2017 [place] FLU! Because you are pregnant, we at [facility], along with the Centers for Disease Control (CDC), recommend that you receive the flu vaccine to protect yourself and your baby from the flu. The flu is more likely to cause severe illness in pregnant women than in women of reproductive age who are not pregnant. Changes in the immune system, heart, and lungs during pregnancy make pregnant women (and women up to two weeks postpartum) more prone to severe illness from flu, including illness resulting in hospitalization. Flu also may be harmful for a pregnant woman's developing baby. A common flu symptom is fever, which may be associated with neural tube defects and other adverse outcomes for a developing baby. Getting vaccinated can also help protect a baby after birth from flu. (Mom passes antibodies onto the developing baby during her pregnancy.)  A Flu Vaccine is the Best Protection Against Flu Getting a flu vaccine is the first and most important step in protecting against flu. Pregnant women should get a flu shot and not the live attenuated influenza vaccine (LAIV), also known as nasal spray flu vaccine. Flu vaccines given during pregnancy help protect both the mother and her baby from flu. Vaccination has been shown to reduce the risk of flu-associated acute respiratory infection in pregnant women by up to one-half. A 2018 study showed that getting a flu shot reduced a pregnant woman's  risk of being hospitalized with flu by an average of 40 percent. Pregnant women who get a flu  vaccine are also helping to protect their babies from flu illness for the first several months after their birth, when they are too young to get vaccinated.   A Long Record of Safety for Flu Shots in Pregnant Women Flu shots have been given to millions of pregnant women over many years with a good safety record. There is a lot of evidence that flu vaccines can be given safely during pregnancy; though these data are limited for the first trimester. The CDC recommends that pregnant women get vaccinated during any trimester of their pregnancy. It is very important for pregnant women to get the flu shot.   Other Preventive Actions In addition to getting a flu shot, pregnant women should take the same everyday preventive actions the CDC recommends of everyone, including covering coughs, washing hands often, and avoiding people who are sick.  Symptoms and Treatment If you get sick with flu symptoms call your doctor right away. There are antiviral drugs that can treat flu illness and prevent serious flu complications. The CDC recommends prompt treatment for people who have influenza infection or suspected influenza infection and who are at high risk of serious flu complications, such as people with asthma, diabetes (including gestational diabetes), or heart disease. Early treatment of influenza in hospitalized pregnant women has been shown to reduce the length of the hospital stay.  Symptoms Flu symptoms include fever, cough, sore throat, runny or stuffy nose, body aches, headache, chills and fatigue. Some people may also have vomiting and diarrhea. People may be infected with the flu and have respiratory symptoms without a fever.  Early Treatment is Important for Pregnant Women Treatment should begin as soon as possible because antiviral drugs work best when started early (within 48 hours after symptoms  start). Antiviral drugs can make your flu illness milder and make you feel better faster. They may also prevent serious health problems that can result from flu illness. Oral oseltamivir (Tamiflu) is the preferred treatment for pregnant women because it has the most studies available to suggest that it is safe and beneficial. Antiviral drugs require a prescription from your provider. Having a fever caused by flu infection or other infections early in pregnancy may be linked to birth defects in a baby. In addition to taking antiviral drugs, pregnant women who get a fever should treat their fever with Tylenol (acetaminophen) and contact their provider immediately.  When to [place] If you are pregnant and have any of these signs, seek care immediately:  Difficulty breathing or shortness of breath  Pain or pressure in the chest or abdomen  Sudden dizziness  Confusion  Severe or persistent vomiting  High fever that is not responding to Tylenol (or store brand equivalent)  Decreased or no movement of your baby  [email or web address]

## 2020-02-02 NOTE — Progress Notes (Signed)
LOW-RISK PREGNANCY VISIT Patient name: Rachel Orr MRN [id number]  Date of birth: 06-28-1981 Chief Complaint:   Routine Prenatal Visit ([place] & 2nd IT; + heartburn; tightness in belly off and on )  History of Present Illness:   Rachel Orr is a 39 y.o. [id number] female at [redacted]w[redacted]d with an Estimated Date of Delivery: 06/30/20 being seen today for ongoing management of a low-risk pregnancy.  Today she reports heartburn- requesting rx as Tums isn't helping. Contractions: Not present. Vag. Bleeding: None.  Movement: Present. denies leaking of fluid. Still has a couple days left of Keflex rx from recent pyelo; no longer symptomatic. Review of Systems:   Pertinent items are noted in HPI Denies abnormal vaginal discharge w/ itching/odor/irritation, headaches, visual changes, shortness of breath, chest pain, abdominal pain, severe nausea/vomiting, or problems with urination or bowel movements unless otherwise stated above. Pertinent History Reviewed:  Reviewed past medical,surgical, social, obstetrical and family history.  Reviewed problem list, medications and allergies. Physical Assessment:   Vitals:   02/02/20 1007  BP: 122/82  Pulse: 86  Weight: 144 lb (65.3 kg)  Body mass index is 24.72 kg/m.        Physical Examination:   General appearance: Well appearing, and in no distress  Mental status: Alert, oriented to person, place, and time  Skin: Warm & dry  Cardiovascular: Normal heart rate noted  Respiratory: Normal respiratory effort, no distress  Abdomen: Soft, gravid, nontender  Pelvic: Cervical exam deferred         Extremities: Edema: None  Fetal Status: Fetal Heart Rate (bpm): 152 u/s   Movement: Present     Anatomy u/s: [place] 18+5 wks,breech,cx 3.8 cm,posterior/fundal placenta gr 0,normal left ovary,right ovary not visualized,svp of fluid 5.6 cm,fhr 152 bpm,efw 225 g 16%,anatomy complete,no obvious abnormalities   Results for orders placed or performed in visit on 02/02/20 (from  the past 24 hour(s))  POC Urinalysis Dipstick OB   Collection Time: 02/02/20 10:08 AM  Result Value Ref Range   Color, UA     Clarity, UA     Glucose, UA Negative Negative   Bilirubin, UA     Ketones, UA neg    Spec Grav, UA     Blood, UA neg    pH, UA     POC,PROTEIN,UA Negative Negative, Trace, Small (1+), Moderate (2+), Large (3+), 4+   Urobilinogen, UA     Nitrite, UA neg    Leukocytes, UA Negative Negative   Appearance     Odor      Assessment & Plan:  1) Low-risk pregnancy [id number] at [redacted]w[redacted]d with an Estimated Date of Delivery: 06/30/20   2) Heartburn, rx Protonix  3) Recent pyelonephritis, managed outpt with Rocephin & Keflex, still taking Keflex and encouraged to finish; will have her bring urine for POC next week  4) EFW 16th% today, will get f/u growth scan at 24wks   Meds:  Meds ordered this encounter  Medications  . pantoprazole (PROTONIX) 20 MG tablet    Sig: Take 1 tablet (20 mg total) by mouth daily.    Dispense:  30 tablet    Refill:  5    Order Specific Question:   Supervising Provider    Answer:   Marjory Lies   Labs/procedures today: anatomy u/s & 2nd IT  Plan:  Continue routine obstetrical care- POC urine culture next week and needs Pap at next visit  Reviewed: Preterm labor symptoms and general obstetric precautions including but not  limited to vaginal bleeding, contractions, leaking of fluid and fetal movement were reviewed in detail with the patient.  All questions were answered. Didn't ask about home bp cuff. Check bp weekly, let us know if >140/90.   Follow-up: Return in about 4 weeks (around 03/01/2020) for LROB, in person; needs to bring urine for culture next week.  Orders Placed This Encounter  Procedures  . INTEGRATED 2  . POC Urinalysis Dipstick OB   Arabella Merles [facility] 02/02/2020 10:34 AM

## 2020-02-02 NOTE — Progress Notes (Signed)
[place] 18+5 wks,breech,cx 3.8 cm,posterior/fundal placenta gr 0,normal left ovary,right ovary not visualized,svp of fluid 5.6 cm,fhr 152 bpm,efw 225 g 16%,anatomy complete,no obvious abnormalities

## 2020-02-04 LAB — INTEGRATED 2
AFP MoM: 1.25
Alpha-Fetoprotein: 54.2 ng/mL
Crown Rump Length: 56.1 mm
DIA MoM: 2.87
DIA Value: 460.3 pg/mL
Estriol, Unconjugated: 1.24 ng/mL
Gest. Age on Collection Date: 12 weeks
Gestational Age: 18 weeks
Maternal Age at EDD: 38.3 yr
Nuchal Translucency (NT): 1.2 mm
Nuchal Translucency MoM: 0.96
Number of Fetuses: 1
PAPP-A MoM: 2.22
PAPP-A Value: 1827.9 ng/mL
Test Results:: NEGATIVE
Weight: 142 [lb_av]
Weight: 144 [lb_av]
hCG MoM: 0.75
hCG Value: 19.3 IU/mL
uE3 MoM: 0.86

## 2020-02-05 ENCOUNTER — Other Ambulatory Visit: Payer: Self-pay

## 2020-02-05 ENCOUNTER — Inpatient Hospital Stay (HOSPITAL_COMMUNITY)
Admission: AD | Admit: 2020-02-05 | Discharge: 2020-02-05 | Disposition: A | Discharge status: Home / Self Care | Payer: Medicaid Other | Source: Ambulatory Visit | Attending: Obstetrics and Gynecology | Admitting: Obstetrics and Gynecology

## 2020-02-05 ENCOUNTER — Encounter (HOSPITAL_COMMUNITY): Payer: Self-pay | Admitting: Obstetrics and Gynecology

## 2020-02-05 DIAGNOSIS — T148XXA Other injury of unspecified body region, initial encounter: Secondary | ICD-10-CM

## 2020-02-05 DIAGNOSIS — F17210 Nicotine dependence, cigarettes, uncomplicated: Secondary | ICD-10-CM | POA: Insufficient documentation

## 2020-02-05 DIAGNOSIS — W102XXA Fall (on)(from) incline, initial encounter: Secondary | ICD-10-CM

## 2020-02-05 DIAGNOSIS — S20411A Abrasion of right back wall of thorax, initial encounter: Secondary | ICD-10-CM | POA: Insufficient documentation

## 2020-02-05 DIAGNOSIS — O9A212 Injury, poisoning and certain other consequences of external causes complicating pregnancy, second trimester: Secondary | ICD-10-CM | POA: Diagnosis not present

## 2020-02-05 DIAGNOSIS — Z3A19 19 weeks gestation of pregnancy: Secondary | ICD-10-CM

## 2020-02-05 DIAGNOSIS — O99332 Smoking (tobacco) complicating pregnancy, second trimester: Secondary | ICD-10-CM | POA: Diagnosis not present

## 2020-02-05 DIAGNOSIS — W19XXXA Unspecified fall, initial encounter: Secondary | ICD-10-CM | POA: Diagnosis not present

## 2020-02-05 DIAGNOSIS — Z8249 Family history of ischemic heart disease and other diseases of the circulatory system: Secondary | ICD-10-CM | POA: Insufficient documentation

## 2020-02-05 LAB — URINALYSIS, ROUTINE W REFLEX MICROSCOPIC
Bilirubin Urine: NEGATIVE
Glucose, UA: NEGATIVE mg/dL
Hgb urine dipstick: NEGATIVE
Ketones, ur: NEGATIVE mg/dL
Nitrite: NEGATIVE
Protein, ur: >=300 mg/dL — AB
Specific Gravity, Urine: 1.02 (ref 1.005–1.030)
Squamous Epithelial / HPF: >50 — ABNORMAL HIGH (ref 0–5)
pH: 7 (ref 5.0–8.0)

## 2020-02-05 MED ORDER — AQUAPHOR EX OINT
TOPICAL_OINTMENT | Freq: Two times a day (BID) | CUTANEOUS | Status: DC | PRN
  Administered 2020-02-05: 1 via TOPICAL
  Filled 2020-02-05: qty 50

## 2020-02-05 MED ORDER — ACETAMINOPHEN 500 MG PO TABS
1000.0000 mg | ORAL_TABLET | Freq: Once | ORAL | Status: AC
  Administered 2020-02-05: 22:00:00 1000 mg via ORAL
  Filled 2020-02-05: qty 2

## 2020-02-05 NOTE — Discharge Instructions (Signed)

## 2020-02-05 NOTE — MAU Provider Note (Signed)
History     CSN: [id number]  Arrival date and time: 02/05/20 2045   First Provider Initiated Contact with Patient 02/05/20 2134      Chief Complaint  Patient presents with  . Fall   HPI  [name] is a 39 y.o. female [phone] @ [redacted]w[redacted]d here in MAU with a fall. States she fell a few hours ago. She called the office and was instructed to come in.  States she has deck rot and fell through some rotting deck. States she fell on her back. She did not hit her abdomen or her head. She feels sore on her back where she fell. She has not taken anything for the soreness. The pain only worsens when she moves a certain way, other than that she has no pain. Walking or moving makes the pain worse. She attets to full ROM,  No bleeding, occasional movements.    OB History    Gravida  5   Para  4   Term  4   Preterm      AB      Living  4     SAB      TAB      Ectopic      Multiple      Live Births  4           Past Medical History:  Diagnosis Date  . Anemia     Past Surgical History:  Procedure Laterality Date  . NO PAST SURGERIES      Family History  Problem Relation Age of Onset  . Asthma Son   . Hypertension Son   . Hypertension Father   . COPD Father   . Hypertension Sister   . Hypertension Paternal Uncle   . Diabetes Paternal Grandmother   . Hypertension Paternal Grandmother   . Asthma Paternal Grandmother   . COPD Paternal Grandmother     Social History   Tobacco Use  . Smoking status: Current Every Day Smoker    Packs/day: 0.25    Types: Cigarettes  . Smokeless tobacco: Never Used  Substance Use Topics  . Alcohol use: No  . Drug use: No    Allergies: No Known Allergies  Facility-Administered Medications Prior to Admission  Medication Dose Route Frequency Provider Last Rate Last Admin  . cefTRIAXone (ROCEPHIN) injection 1 g  1 g Intramuscular Q24H Lazaro Arms, MD   1 g at 01/27/20 1441   Medications Prior to Admission  Medication  Sig Dispense Refill Last Dose  . cephALEXin (KEFLEX) 500 MG capsule Take 1 capsule (500 mg total) by mouth 4 (four) times daily. (Patient taking differently: Take 500 mg by mouth 3 (three) times daily. ) 28 capsule 0 02/05/2020 at 300pm  . Prenatal Vit-Fe Fumarate-FA (PRENATAL COMPLETE) 14-0.4 MG TABS Take one tablet daily 60 tablet 2 02/04/2020 at Unknown time  . Acetaminophen (TYLENOL PO) Take by mouth as needed.     . Blood Pressure Monitor MISC For regular home bp monitoring during pregnancy (Patient not taking: Reported on 01/26/2020) 1 each 0   . pantoprazole (PROTONIX) 20 MG tablet Take 1 tablet (20 mg total) by mouth daily. 30 tablet 5    Results for orders placed or performed during the hospital encounter of 02/05/20 (from the past 48 hour(s))  Urinalysis, Routine w reflex microscopic     Status: Abnormal   Collection Time: 02/05/20  9:36 PM  Result Value Ref Range   Color, Urine AMBER (A) YELLOW  Comment: BIOCHEMICALS MAY BE AFFECTED BY COLOR   APPearance CLOUDY (A) CLEAR   Specific Gravity, Urine 1.020 1.005 - 1.030   pH 7.0 5.0 - 8.0   Glucose, UA NEGATIVE NEGATIVE mg/dL   Hgb urine dipstick NEGATIVE NEGATIVE   Bilirubin Urine NEGATIVE NEGATIVE   Ketones, ur NEGATIVE NEGATIVE mg/dL   Protein, ur >=300 (A) NEGATIVE mg/dL   Nitrite NEGATIVE NEGATIVE   Leukocytes,Ua TRACE (A) NEGATIVE   RBC / HPF 0-5 0 - 5 RBC/hpf   WBC, UA 11-20 0 - 5 WBC/hpf   Bacteria, UA RARE (A) NONE SEEN   Squamous Epithelial / LPF >50 (H) 0 - 5   Mucus PRESENT     Comment: Performed at [facility] [address], [place], [place] [address]   Review of Systems  Gastrointestinal: Negative for abdominal pain.  Genitourinary: Negative for dysuria and vaginal bleeding.  Musculoskeletal: Positive for back pain.   Physical Exam   Blood pressure 134/86, pulse 98, temperature 98.1 F (36.7 C), temperature source Oral, resp. rate 17, weight 64 kg, last menstrual period 10/29/2019, unknown if  currently breastfeeding.  Physical Exam  Constitutional: She is oriented to person, place, and time. She appears well-developed and well-nourished. No distress.  Eyes: Pupils are equal, round, and reactive to light.  Musculoskeletal:        General: Normal range of motion.     Right shoulder: Tenderness present. Normal range of motion.       Arms:     Cervical back: Normal range of motion and neck supple.     Comments: Right abrasion near right lattissmus dorsi   Neurological: She is alert and oriented to person, place, and time. GCS eye subscore is 4. GCS verbal subscore is 5. GCS motor subscore is 6.  Skin: She is not diaphoretic.  Psychiatric: Her behavior is normal.   MAU Course  Procedures  None  MDM  UA- reviewed + fetal heart tones via doppler Aquaphor to apply to abrasion Tylenol 1 gram given PO   Assessment and Plan   A:  1. Fall (on)(from) incline, initial encounter   2. Abrasion   3. [redacted] weeks gestation of pregnancy     P:  Discharge home in stable condition Return to MAU if symptoms worsen Aquaphor as needed Ok to use tylenol OTC as directed on the bottle  Noni Saupe I, NP 02/05/2020 10:25 PM

## 2020-02-05 NOTE — MAU Note (Signed)
Patient reports to MAU stating she fell through a hole off her porch. Pt reports she hit her back. Pt states she did not hit her head or abdomen. Pt reports she has a doppler at home and FHR was 160 after fall. Pt has scratches on her back. Pt states when she fell she was just tired and wanted to sleep.

## 2020-02-07 NOTE — Telephone Encounter (Signed)
Please see msg from pt.

## 2020-02-07 NOTE — Telephone Encounter (Signed)
Please advise on message from pt

## 2020-03-01 ENCOUNTER — Other Ambulatory Visit (HOSPITAL_COMMUNITY)
Admission: RE | Admit: 2020-03-01 | Discharge: 2020-03-01 | Disposition: A | Discharge status: Home / Self Care | Payer: Medicaid Other | Source: Ambulatory Visit | Attending: Advanced Practice Midwife | Admitting: Advanced Practice Midwife

## 2020-03-01 ENCOUNTER — Ambulatory Visit (INDEPENDENT_AMBULATORY_CARE_PROVIDER_SITE_OTHER): Payer: Medicaid Other | Admitting: Advanced Practice Midwife

## 2020-03-01 ENCOUNTER — Encounter: Payer: Self-pay | Admitting: Advanced Practice Midwife

## 2020-03-01 ENCOUNTER — Other Ambulatory Visit: Payer: Self-pay

## 2020-03-01 VITALS — BP 106/69 | HR 82 | Wt 144.5 lb

## 2020-03-01 DIAGNOSIS — Z3482 Encounter for supervision of other normal pregnancy, second trimester: Secondary | ICD-10-CM

## 2020-03-01 DIAGNOSIS — Z3A22 22 weeks gestation of pregnancy: Secondary | ICD-10-CM

## 2020-03-01 DIAGNOSIS — Z331 Pregnant state, incidental: Secondary | ICD-10-CM

## 2020-03-01 DIAGNOSIS — Z3480 Encounter for supervision of other normal pregnancy, unspecified trimester: Secondary | ICD-10-CM

## 2020-03-01 DIAGNOSIS — Z87440 Personal history of urinary (tract) infections: Secondary | ICD-10-CM

## 2020-03-01 DIAGNOSIS — O2302 Infections of kidney in pregnancy, second trimester: Secondary | ICD-10-CM

## 2020-03-01 DIAGNOSIS — Z1389 Encounter for screening for other disorder: Secondary | ICD-10-CM

## 2020-03-01 LAB — POCT URINALYSIS DIPSTICK OB
Blood, UA: NEGATIVE
Glucose, UA: NEGATIVE
Ketones, UA: NEGATIVE
Leukocytes, UA: NEGATIVE
Nitrite, UA: NEGATIVE
POC,PROTEIN,UA: NEGATIVE

## 2020-03-01 NOTE — Patient Instructions (Signed)
Rachel Orr, I greatly value your feedback.  If you receive a survey following your visit with [place] today, we appreciate you taking the time to fill it out.  Thanks, Philipp Deputy, CNM   You will have your sugar test next visit.  Please do not eat or drink anything after midnight the night before you come, not even water.  You will be here for at least two hours.  Please make an appointment online for the bloodwork at [email or web address] for 8:30am (or as close to this as possible). Make sure you select the [facility] service center. The day of the appointment, check in with our office first, then you will go to [facility] to start the sugar test.    [facility] HAS MOVED!!! It is now [facility] ([address] [place], [place] [address]) Entrance C, located off of [address] valet parking  Go to [email or web address] to register for FREE online childbirth classes   Call the office [phone]) or go to [facility] if:  You begin to have strong, frequent contractions  Your water breaks.  Sometimes it is a big gush of fluid, sometimes it is just a trickle that keeps getting your panties wet or running down your legs  You have vaginal bleeding.  It is normal to have a small amount of spotting if your cervix was checked.   You don't feel your baby moving like normal.  If you don't, get you something to eat and drink and lay down and focus on feeling your baby move.   If your baby is still not moving like normal, you should call the office or go to [facility].  [place] Pediatricians/Family Doctors:  [facility] [phone]            [facility] [phone]                 [facility] Medicine [phone] (usually not accepting new patients unless you have family there already, you are always welcome to call and ask)       [facility] Department [phone]       [facility] Pediatricians/Family Doctors:    [facility]: [phone]  [facility]: [phone]  [facility]: [phone]  [facility] Doctors:   [facility]: [phone]   [facility]: [phone]  [facility] Doctors:  [facility]: [phone]   Home Blood Pressure Monitoring for Patients   Your provider has recommended that you check your blood pressure (BP) at least once a week at home. If you do not have a blood pressure cuff at home, one will be provided for you. Contact your provider if you have not received your monitor within 1 week.   Helpful Tips for Accurate Home Blood Pressure Checks  . Don't smoke, exercise, or drink caffeine 30 minutes before checking your BP . Use the restroom before checking your BP (a full bladder can raise your pressure) . Relax in a comfortable upright chair . Feet on the ground . Left arm resting comfortably on a flat surface at the level of your heart . Legs uncrossed . Back supported . Sit quietly and don't talk . Place the cuff on your bare arm . Adjust snuggly, so that only two fingertips can fit between your skin and the top of the cuff . Check 2 readings separated by at least one minute . Keep a log of your BP  readings . For a visual, please reference this diagram: http://ccnc.care/bpdiagram  Provider Name: [facility]     Phone: [phone]  Zone 1: ALL CLEAR  Continue to monitor your symptoms:  . BP reading is less than 140 (top number) or less than 90 (bottom number)  . No right upper stomach pain . No headaches or seeing spots . No feeling nauseated or throwing up . No swelling in face and hands  Zone 2: CAUTION Call your doctor's office for any of the following:  . BP reading is greater than 140 (top number) or greater than 90 (bottom number)  . Stomach pain under your ribs in the middle or right side . Headaches or seeing spots . Feeling  nauseated or throwing up . Swelling in face and hands  Zone 3: EMERGENCY  Seek immediate medical care if you have any of the following:  . BP reading is greater than160 (top number) or greater than 110 (bottom number) . Severe headaches not improving with Tylenol . Serious difficulty catching your breath . Any worsening symptoms from Zone 2   Second Trimester of Pregnancy The second trimester is from week 13 through week 28, months 4 through 6. The second trimester is often a time when you feel your best. Your body has also adjusted to being pregnant, and you begin to feel better physically. Usually, morning sickness has lessened or quit completely, you may have more energy, and you may have an increase in appetite. The second trimester is also a time when the fetus is growing rapidly. At the end of the sixth month, the fetus is about 9 inches long and weighs about 1 pounds. You will likely begin to feel the baby move (quickening) between 18 and 20 weeks of the pregnancy. BODY CHANGES Your body goes through many changes during pregnancy. The changes vary from woman to woman.   Your weight will continue to increase. You will notice your lower abdomen bulging out.  You may begin to get stretch marks on your hips, abdomen, and breasts.  You may develop headaches that can be relieved by medicines approved by your health care provider.  You may urinate more often because the fetus is pressing on your bladder.  You may develop or continue to have heartburn as a result of your pregnancy.  You may develop constipation because certain hormones are causing the muscles that push waste through your intestines to slow down.  You may develop hemorrhoids or swollen, bulging veins (varicose veins).  You may have back pain because of the weight gain and pregnancy hormones relaxing your joints between the bones in your pelvis and as a result of a shift in weight and the muscles that support your  balance.  Your breasts will continue to grow and be tender.  Your gums may bleed and may be sensitive to brushing and flossing.  Dark spots or blotches (chloasma, mask of pregnancy) may develop on your face. This will likely fade after the baby is born.  A dark line from your belly button to the pubic area (linea nigra) may appear. This will likely fade after the baby is born.  You may have changes in your hair. These can include thickening of your hair, rapid growth, and changes in texture. Some women also have hair loss during or after pregnancy, or hair that feels dry or thin. Your hair will most likely return to normal after your baby is born. WHAT TO EXPECT AT YOUR PRENATAL VISITS During  a routine prenatal visit:  You will be weighed to make sure you and the fetus are growing normally.  Your blood pressure will be taken.  Your abdomen will be measured to track your baby's growth.  The fetal heartbeat will be listened to.  Any test results from the previous visit will be discussed. Your health care provider may ask you:  How you are feeling.  If you are feeling the baby move.  If you have had any abnormal symptoms, such as leaking fluid, bleeding, severe headaches, or abdominal cramping.  If you have any questions. Other tests that may be performed during your second trimester include:  Blood tests that check for:  Low iron levels (anemia).  Gestational diabetes (between 24 and 28 weeks).  Rh antibodies.  Urine tests to check for infections, diabetes, or protein in the urine.  An ultrasound to confirm the proper growth and development of the baby.  An amniocentesis to check for possible genetic problems.  Fetal screens for spina bifida and Down syndrome. HOME CARE INSTRUCTIONS   Avoid all smoking, herbs, alcohol, and unprescribed drugs. These chemicals affect the formation and growth of the baby.  Follow your health care provider's instructions regarding  medicine use. There are medicines that are either safe or unsafe to take during pregnancy.  Exercise only as directed by your health care provider. Experiencing uterine cramps is a good sign to stop exercising.  Continue to eat regular, healthy meals.  Wear a good support bra for breast tenderness.  Do not use hot tubs, steam rooms, or saunas.  Wear your seat belt at all times when driving.  Avoid raw meat, uncooked cheese, cat litter boxes, and soil used by cats. These carry germs that can cause birth defects in the baby.  Take your prenatal vitamins.  Try taking a stool softener (if your health care provider approves) if you develop constipation. Eat more high-fiber foods, such as fresh vegetables or fruit and whole grains. Drink plenty of fluids to keep your urine clear or pale yellow.  Take warm sitz baths to soothe any pain or discomfort caused by hemorrhoids. Use hemorrhoid cream if your health care provider approves.  If you develop varicose veins, wear support hose. Elevate your feet for 15 minutes, 3-4 times a day. Limit salt in your diet.  Avoid heavy lifting, wear low heel shoes, and practice good posture.  Rest with your legs elevated if you have leg cramps or low back pain.  Visit your dentist if you have not gone yet during your pregnancy. Use a soft toothbrush to brush your teeth and be gentle when you floss.  A sexual relationship may be continued unless your health care provider directs you otherwise.  Continue to go to all your prenatal visits as directed by your health care provider. SEEK MEDICAL CARE IF:   You have dizziness.  You have mild pelvic cramps, pelvic pressure, or nagging pain in the abdominal area.  You have persistent nausea, vomiting, or diarrhea.  You have a bad smelling vaginal discharge.  You have pain with urination. SEEK IMMEDIATE MEDICAL CARE IF:   You have a fever.  You are leaking fluid from your vagina.  You have spotting or  bleeding from your vagina.  You have severe abdominal cramping or pain.  You have rapid weight gain or loss.  You have shortness of breath with chest pain.  You notice sudden or extreme swelling of your face, hands, ankles, feet, or legs.  You  have not felt your baby move in over an hour.  You have severe headaches that do not go away with medicine.  You have vision changes. Document Released: 10/08/2001 Document Revised: 10/19/2013 Document Reviewed: 12/15/2012 [facility] Patient Information 2015 [place], [place]. This information is not intended to replace advice given to you by your health care provider. Make sure you discuss any questions you have with your health care provider.

## 2020-03-01 NOTE — Progress Notes (Signed)
   LOW-RISK PREGNANCY VISIT Patient name: Rachel Orr MRN [id number]  Date of birth: 20-Oct-1981 Chief Complaint:   Routine Prenatal Visit (Pap today)  History of Present Illness:   Rachel Orr is a 39 y.o. [id number] female at [redacted]w[redacted]d with an Estimated Date of Delivery: 06/30/20 being seen today for ongoing management of a low-risk pregnancy.  Today she reports no complaints. Contractions: Not present. Vag. Bleeding: None.  Movement: Present. denies leaking of fluid. Review of Systems:   Pertinent items are noted in HPI Denies abnormal vaginal discharge w/ itching/odor/irritation, headaches, visual changes, shortness of breath, chest pain, abdominal pain, severe nausea/vomiting, or problems with urination or bowel movements unless otherwise stated above. Pertinent History Reviewed:  Reviewed past medical,surgical, social, obstetrical and family history.  Reviewed problem list, medications and allergies. Physical Assessment:   Vitals:   03/01/20 1044  BP: 106/69  Pulse: 82  Weight: 144 lb 8 oz (65.5 kg)  Body mass index is 24.8 kg/m.        Physical Examination:   General appearance: Well appearing, and in no distress  Mental status: Alert, oriented to person, place, and time  Skin: Warm & dry  Cardiovascular: Normal heart rate noted  Respiratory: Normal respiratory effort, no distress  Abdomen: Soft, gravid, nontender  Pelvic: Cervical exam deferred; Pap collected        Extremities: Edema: None  Fetal Status: Fetal Heart Rate (bpm): 148 Fundal Height: 21 cm Movement: Present    Results for orders placed or performed in visit on 03/01/20 (from the past 24 hour(s))  POC Urinalysis Dipstick OB   Collection Time: 03/01/20 10:42 AM  Result Value Ref Range   Color, UA     Clarity, UA     Glucose, UA Negative Negative   Bilirubin, UA     Ketones, UA neg    Spec Grav, UA     Blood, UA neg    pH, UA     POC,PROTEIN,UA Negative Negative, Trace, Small (1+), Moderate (2+), Large  (3+), 4+   Urobilinogen, UA     Nitrite, UA neg    Leukocytes, UA Negative Negative   Appearance     Odor      Assessment & Plan:  1) Low-risk pregnancy [id number] at [redacted]w[redacted]d with an Estimated Date of Delivery: 06/30/20   2) EFW 16th% at 18wks , f/u on growth with u/s next visit  3) Previous pyelo, POC sent   Meds: No orders of the defined types were placed in this encounter.  Labs/procedures today: Pap  Plan:  Continue routine obstetrical care   Reviewed: Preterm labor symptoms and general obstetric precautions including but not limited to vaginal bleeding, contractions, leaking of fluid and fetal movement were reviewed in detail with the patient.  All questions were answered. Didn't ask about home bp cuff. Check bp weekly, let us know if >140/90.   Follow-up: Return in about 4 weeks (around 03/29/2020) for LROB, PN2, in person, [place]: EFW.  Orders Placed This Encounter  Procedures  . Urine Culture  . US OB Follow Up  . POC Urinalysis Dipstick OB   Arabella Merles [facility] 03/01/2020 1:15 PM

## 2020-03-03 LAB — CYTOLOGY - PAP
Adequacy: ABSENT
Comment: NEGATIVE
Diagnosis: NEGATIVE
High risk HPV: NEGATIVE

## 2020-03-03 LAB — URINE CULTURE

## 2020-03-07 NOTE — Telephone Encounter (Signed)
Pt would like to see what else she can take for her cough.

## 2020-03-26 ENCOUNTER — Other Ambulatory Visit: Payer: Self-pay

## 2020-03-26 ENCOUNTER — Encounter (HOSPITAL_COMMUNITY): Payer: Self-pay | Admitting: Obstetrics and Gynecology

## 2020-03-26 ENCOUNTER — Inpatient Hospital Stay (HOSPITAL_COMMUNITY)
Admission: AD | Admit: 2020-03-26 | Discharge: 2020-03-26 | Disposition: A | Discharge status: Home / Self Care | Payer: Medicaid Other | Attending: Obstetrics and Gynecology | Admitting: Obstetrics and Gynecology

## 2020-03-26 DIAGNOSIS — O99332 Smoking (tobacco) complicating pregnancy, second trimester: Secondary | ICD-10-CM | POA: Insufficient documentation

## 2020-03-26 DIAGNOSIS — F17210 Nicotine dependence, cigarettes, uncomplicated: Secondary | ICD-10-CM | POA: Diagnosis not present

## 2020-03-26 DIAGNOSIS — Z79899 Other long term (current) drug therapy: Secondary | ICD-10-CM | POA: Insufficient documentation

## 2020-03-26 DIAGNOSIS — O479 False labor, unspecified: Secondary | ICD-10-CM

## 2020-03-26 DIAGNOSIS — Z3A26 26 weeks gestation of pregnancy: Secondary | ICD-10-CM

## 2020-03-26 DIAGNOSIS — O4702 False labor before 37 completed weeks of gestation, second trimester: Secondary | ICD-10-CM | POA: Diagnosis not present

## 2020-03-26 LAB — URINALYSIS, ROUTINE W REFLEX MICROSCOPIC
Bilirubin Urine: NEGATIVE
Glucose, UA: NEGATIVE mg/dL
Hgb urine dipstick: NEGATIVE
Ketones, ur: NEGATIVE mg/dL
Leukocytes,Ua: NEGATIVE
Nitrite: NEGATIVE
Protein, ur: NEGATIVE mg/dL
Specific Gravity, Urine: 1.005 (ref 1.005–1.030)
pH: 6 (ref 5.0–8.0)

## 2020-03-26 NOTE — MAU Note (Signed)
Rachel Orr is a 39 y.o. at [redacted]w[redacted]d here in MAU reporting: [employer] that started yesterday. They occur about every 5 minutes. No bleeding or LOF. +FM  Onset of complaint: yesterday  Pain score: 4/10  Vitals:   03/26/20 1741  BP: 140/76  Pulse: 89  Resp: 16  Temp: 98.2 F (36.8 C)  SpO2: 100%     FHT: +FM  Lab orders placed from triage: UA

## 2020-03-26 NOTE — MAU Provider Note (Signed)
First Provider Initiated Contact with Patient 03/26/20 1751      Chief Complaint:  Shevonne Wolf is  39 y.o. [id number] at [redacted]w[redacted]d presents complaining of [facility] . She states she has been having BH ctx since yesterday.  Feels like "my stomach is getting tight." IC last night.  Denies bleeding. ROM. Fetus active.  Obstetrical/Gynecological History: OB History     Gravida  5   Para  4   Term  4   Preterm      AB      Living  4      SAB      TAB      Ectopic      Multiple      Live Births  4          Past Medical History: Past Medical History:  Diagnosis Date   Anemia     Past Surgical History: Past Surgical History:  Procedure Laterality Date   NO PAST SURGERIES      Family History: Family History  Problem Relation Age of Onset   Asthma Son    Hypertension Son    Hypertension Father    COPD Father    Hypertension Sister    Hypertension Paternal Uncle    Diabetes Paternal Grandmother    Hypertension Paternal Grandmother    Asthma Paternal Grandmother    COPD Paternal Grandmother     Social History: Social History   Tobacco Use   Smoking status: Current Every Day Smoker    Packs/day: 1.00    Types: Cigarettes   Smokeless tobacco: Never Used  Substance Use Topics   Alcohol use: No   Drug use: No    Allergies: No Known Allergies  Meds:  Medications Prior to Admission  Medication Sig Dispense Refill Last Dose   Acetaminophen (TYLENOL PO) Take by mouth as needed.      Blood Pressure Monitor MISC For regular home bp monitoring during pregnancy (Patient not taking: Reported on 01/26/2020) 1 each 0    pantoprazole (PROTONIX) 20 MG tablet Take 1 tablet (20 mg total) by mouth daily. 30 tablet 5    Prenatal Vit-Fe Fumarate-FA (PRENATAL COMPLETE) 14-0.4 MG TABS Take one tablet daily 60 tablet 2     Review of Systems   Constitutional: Negative for fever and chills Eyes: Negative for visual disturbances Respiratory:  Negative for shortness of breath, dyspnea Cardiovascular: Negative for chest pain or palpitations  Gastrointestinal: Negative for vomiting, diarrhea and constipation Genitourinary: Negative for dysuria and urgency Musculoskeletal: Negative for back pain, joint pain, myalgias.  Normal ROM  Neurological: Negative for dizziness and headaches    Physical Exam  Blood pressure 130/73, pulse 87, temperature 98.2 F (36.8 C), temperature source Oral, resp. rate 16, height 5\' 4"  (1.626 m), weight 66.4 kg, last menstrual period 10/29/2019, SpO2 100 %, unknown if currently breastfeeding. GENERAL: Well-developed, well-nourished female in no acute distress.  LUNGS: Normal respiratory effort HEART: Regular rate and rhythm. ABDOMEN: Soft, nontender, nondistended, gravid.  EXTREMITIES: Nontender, no edema, 2+ distal pulses. DTR's 2+ CERVICAL EXAM: Dilatation .05cm   Effacement very long/thick  Station ballottable   Presentation: unsure FHT:  Baseline rate 150 bpm   Variability moderate  Accelerations present   Decelerations none Contractions: Every 0 mins   Ctx not palpable when pt feeling "tightening"   Labs: Results for orders placed or performed during the hospital encounter of 03/26/20 (from the past 24 hour(s))  Urinalysis, Routine w reflex  microscopic   Collection Time: 03/26/20  5:34 PM  Result Value Ref Range   Color, Urine YELLOW YELLOW   APPearance CLEAR CLEAR   Specific Gravity, Urine 1.005 1.005 - 1.030   pH 6.0 5.0 - 8.0   Glucose, UA NEGATIVE NEGATIVE mg/dL   Hgb urine dipstick NEGATIVE NEGATIVE   Bilirubin Urine NEGATIVE NEGATIVE   Ketones, ur NEGATIVE NEGATIVE mg/dL   Protein, ur NEGATIVE NEGATIVE mg/dL   Nitrite NEGATIVE NEGATIVE   Leukocytes,Ua NEGATIVE NEGATIVE   Imaging Studies:  No results found.  Assessment: AZIAH KAISER is  39 y.o. [id number] at [redacted]w[redacted]d presents with BH, no evidence of PTL FFN not appropriate d/t recent IC.  Plan: DC home w/PTL  precautions  Christin Fudge 5/30/20216:21 PM

## 2020-03-26 NOTE — Discharge Instructions (Signed)
Braxton Hicks Contractions °Contractions of the uterus can occur throughout pregnancy, but they are not always a sign that you are in labor. You may have practice contractions called Braxton Hicks contractions. These false labor contractions are sometimes confused with true labor. °What are Braxton Hicks contractions? °Braxton Hicks contractions are tightening movements that occur in the muscles of the uterus before labor. Unlike true labor contractions, these contractions do not result in opening (dilation) and thinning of the cervix. Toward the end of pregnancy (32-34 weeks), Braxton Hicks contractions can happen more often and may become stronger. These contractions are sometimes difficult to tell apart from true labor because they can be very uncomfortable. You should not feel embarrassed if you go to the hospital with false labor. °Sometimes, the only way to tell if you are in true labor is for your health care provider to look for changes in the cervix. The health care provider will do a physical exam and may monitor your contractions. If you are not in true labor, the exam should show that your cervix is not dilating and your water has not broken. °If there are no other health problems associated with your pregnancy, it is completely safe for you to be sent home with false labor. You may continue to have Braxton Hicks contractions until you go into true labor. °How to tell the difference between true labor and false labor °True labor °· Contractions last 30-70 seconds. °· Contractions become very regular. °· Discomfort is usually felt in the top of the uterus, and it spreads to the lower abdomen and low back. °· Contractions do not go away with walking. °· Contractions usually become more intense and increase in frequency. °· The cervix dilates and gets thinner. °False labor °· Contractions are usually shorter and not as strong as true labor contractions. °· Contractions are usually irregular. °· Contractions  are often felt in the front of the lower abdomen and in the groin. °· Contractions may go away when you walk around or change positions while lying down. °· Contractions get weaker and are shorter-lasting as time goes on. °· The cervix usually does not dilate or become thin. °Follow these instructions at home: ° °· Take over-the-counter and prescription medicines only as told by your health care provider. °· Keep up with your usual exercises and follow other instructions from your health care provider. °· Eat and drink lightly if you think you are going into labor. °· If Braxton Hicks contractions are making you uncomfortable: °? Change your position from lying down or resting to walking, or change from walking to resting. °? Sit and rest in a tub of warm water. °? Drink enough fluid to keep your urine pale yellow. Dehydration may cause these contractions. °? Do slow and deep breathing several times an hour. °· Keep all follow-up prenatal visits as told by your health care provider. This is important. °Contact a health care provider if: °· You have a fever. °· You have continuous pain in your abdomen. °Get help right away if: °· Your contractions become stronger, more regular, and closer together. °· You have fluid leaking or gushing from your vagina. °· You pass blood-tinged mucus (bloody show). °· You have bleeding from your vagina. °· You have low back pain that you never had before. °· You feel your baby’s head pushing down and causing pelvic pressure. °· Your baby is not moving inside you as much as it used to. °Summary °· Contractions that occur before labor are   called Braxton Hicks contractions, false labor, or practice contractions. °· Braxton Hicks contractions are usually shorter, weaker, farther apart, and less regular than true labor contractions. True labor contractions usually become progressively stronger and regular, and they become more frequent. °· Manage discomfort from Braxton Hicks contractions  by changing position, resting in a warm bath, drinking plenty of water, or practicing deep breathing. °This information is not intended to replace advice given to you by your health care provider. Make sure you discuss any questions you have with your health care provider. °Document Revised: 09/26/2017 Document Reviewed: 02/27/2017 °Elsevier Patient Education © 2020 [employer] ° °

## 2020-03-28 ENCOUNTER — Other Ambulatory Visit: Payer: Self-pay | Admitting: Advanced Practice Midwife

## 2020-03-28 DIAGNOSIS — O26842 Uterine size-date discrepancy, second trimester: Secondary | ICD-10-CM

## 2020-03-28 DIAGNOSIS — Z3482 Encounter for supervision of other normal pregnancy, second trimester: Secondary | ICD-10-CM

## 2020-03-29 ENCOUNTER — Encounter: Payer: Self-pay | Admitting: Family Medicine

## 2020-03-29 ENCOUNTER — Ambulatory Visit (INDEPENDENT_AMBULATORY_CARE_PROVIDER_SITE_OTHER): Payer: Medicaid Other

## 2020-03-29 ENCOUNTER — Other Ambulatory Visit: Payer: Self-pay | Admitting: Advanced Practice Midwife

## 2020-03-29 ENCOUNTER — Ambulatory Visit (INDEPENDENT_AMBULATORY_CARE_PROVIDER_SITE_OTHER): Payer: Medicaid Other | Admitting: Family Medicine

## 2020-03-29 ENCOUNTER — Other Ambulatory Visit: Payer: Medicaid Other

## 2020-03-29 VITALS — BP 116/75 | HR 90 | Wt 144.0 lb

## 2020-03-29 DIAGNOSIS — Z3482 Encounter for supervision of other normal pregnancy, second trimester: Secondary | ICD-10-CM

## 2020-03-29 DIAGNOSIS — Z331 Pregnant state, incidental: Secondary | ICD-10-CM

## 2020-03-29 DIAGNOSIS — O09522 Supervision of elderly multigravida, second trimester: Secondary | ICD-10-CM

## 2020-03-29 DIAGNOSIS — O99332 Smoking (tobacco) complicating pregnancy, second trimester: Secondary | ICD-10-CM

## 2020-03-29 DIAGNOSIS — Z23 Encounter for immunization: Secondary | ICD-10-CM | POA: Diagnosis not present

## 2020-03-29 DIAGNOSIS — Z3A26 26 weeks gestation of pregnancy: Secondary | ICD-10-CM

## 2020-03-29 DIAGNOSIS — O26842 Uterine size-date discrepancy, second trimester: Secondary | ICD-10-CM

## 2020-03-29 DIAGNOSIS — O09523 Supervision of elderly multigravida, third trimester: Secondary | ICD-10-CM

## 2020-03-29 DIAGNOSIS — F17200 Nicotine dependence, unspecified, uncomplicated: Secondary | ICD-10-CM

## 2020-03-29 DIAGNOSIS — Z3480 Encounter for supervision of other normal pregnancy, unspecified trimester: Secondary | ICD-10-CM

## 2020-03-29 DIAGNOSIS — Z1389 Encounter for screening for other disorder: Secondary | ICD-10-CM

## 2020-03-29 LAB — POCT URINALYSIS DIPSTICK OB
Blood, UA: NEGATIVE
Ketones, UA: NEGATIVE
Leukocytes, UA: NEGATIVE
Nitrite, UA: NEGATIVE
POC,PROTEIN,UA: NEGATIVE

## 2020-03-29 NOTE — Addendum Note (Signed)
Addended by: Moss Mc on: 03/29/2020 10:43 AM   Modules accepted: Orders

## 2020-03-29 NOTE — Progress Notes (Signed)
   PRENATAL VISIT NOTE  Subjective:  JERIANN SAYRES is a 39 y.o. [id number] at [redacted]w[redacted]d being seen today for ongoing prenatal care.  She is currently monitored for the following issues for this low-risk pregnancy and has Encounter for supervision of other normal pregnancy, unspecified trimester; Smoker; and Pyelonephritis affecting pregnancy in second trimester on their problem list.  Patient reports no complaints.  Contractions: Irritability. Vag. Bleeding: None.  Movement: Present. Denies leaking of fluid.   The following portions of the patient's history were reviewed and updated as appropriate: allergies, current medications, past family history, past medical history, past social history, past surgical history and problem list.   Objective:   Vitals:   03/29/20 0952  BP: 116/75  Pulse: 90  Weight: 144 lb (65.3 kg)    Fetal Status:     Movement: Present     General:  Alert, oriented and cooperative. Patient is in no acute distress.  Skin: Skin is warm and dry. No rash noted.   Cardiovascular: Normal heart rate noted  Respiratory: Normal respiratory effort, no problems with respiration noted  Abdomen: Soft, gravid, appropriate for gestational age.  Pain/Pressure: Absent     Pelvic: Cervical exam deferred        Extremities: Normal range of motion.  Edema: None  Mental Status: Normal mood and affect. Normal behavior. Normal judgment and thought content.   Assessment and Plan:  Pregnancy: [id number] at [redacted]w[redacted]d Karel was seen today for routine prenatal visit.  Diagnoses and all orders for this visit:  Encounter for supervision of other normal pregnancy, unspecified trimester - RTC in 4 weeks - 28w labs today - Tdap today - girl, bottle, Depo - [place] f/u today   Multigravida of advanced maternal age in second trimester - no hx PPH - consider TXA at delivery  Smoker - continues to smoke about 1/2 ppd; consider Nicotine patch PP - reports she is trying to quit  Preterm labor  symptoms and general obstetric precautions including but not limited to vaginal bleeding, contractions, leaking of fluid and fetal movement were reviewed in detail with the patient. Please refer to After Visit Summary for other counseling recommendations.   Return in about 4 weeks (around 04/26/2020) for LOB; in person or virtual per patient preference.  No future appointments.  Joselyn Arrow, MD

## 2020-03-29 NOTE — Progress Notes (Signed)
K[place] 26+5 wks,breech,fundal placenta gr 0,normal ovaries,cx 4 cm,fhr 154 bpm,svp of fluid 4.2 cm,EFW 905 g 21%

## 2020-03-30 LAB — CBC
Hematocrit: 35.8 % (ref 34.0–46.6)
Hemoglobin: 11.7 g/dL (ref 11.1–15.9)
MCH: 32.2 pg (ref 26.6–33.0)
MCHC: 32.7 g/dL (ref 31.5–35.7)
MCV: 99 fL — ABNORMAL HIGH (ref 79–97)
Platelets: 349 10*3/uL (ref 150–450)
RBC: 3.63 x10E6/uL — ABNORMAL LOW (ref 3.77–5.28)
RDW: 12.6 % (ref 11.7–15.4)
WBC: 15.3 10*3/uL — ABNORMAL HIGH (ref 3.4–10.8)

## 2020-03-30 LAB — GLUCOSE TOLERANCE, 2 HOURS W/ 1HR
Glucose, 1 hour: 141 mg/dL (ref 65–179)
Glucose, 2 hour: 98 mg/dL (ref 65–152)
Glucose, Fasting: 80 mg/dL (ref 65–91)

## 2020-03-30 LAB — RPR: RPR Ser Ql: NONREACTIVE

## 2020-03-30 LAB — HIV ANTIBODY (ROUTINE TESTING W REFLEX): HIV Screen 4th Generation wRfx: NONREACTIVE

## 2020-03-30 LAB — ANTIBODY SCREEN: Antibody Screen: NEGATIVE

## 2020-04-10 ENCOUNTER — Telehealth: Payer: Self-pay | Admitting: Advanced Practice Midwife

## 2020-04-10 ENCOUNTER — Encounter: Payer: Self-pay | Admitting: *Deleted

## 2020-04-10 NOTE — Telephone Encounter (Signed)
Pt states she received notice stating that she is 4 cm dilated and needs a work note stating that she can work while dilated. 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 Patient walked into office requesting this.

## 2020-04-10 NOTE — Telephone Encounter (Signed)
Pt wants a work note stating that she can still do pt in home care for work while being dilated to 4 cm

## 2020-04-11 ENCOUNTER — Other Ambulatory Visit: Payer: Self-pay | Admitting: Women's Health

## 2020-04-11 ENCOUNTER — Telehealth: Payer: Self-pay | Admitting: Women's Health

## 2020-04-11 MED ORDER — PRENATAL COMPLETE 14-0.4 MG PO TABS: ORAL_TABLET | ORAL | 11 refills | Status: DC

## 2020-04-11 NOTE — Telephone Encounter (Signed)
Patient called stating that she would like a refill of her prenatal Vitamins. Pt states that her bottle states she has no more refills. Pt states that [facility] [place]. Please contact pt when sent.

## 2020-04-13 ENCOUNTER — Telehealth: Payer: Self-pay | Admitting: Women's Health

## 2020-04-13 NOTE — Telephone Encounter (Signed)
Patient states her pharmacy does not have orders for prenatal vitamins.

## 2020-04-26 ENCOUNTER — Ambulatory Visit (INDEPENDENT_AMBULATORY_CARE_PROVIDER_SITE_OTHER): Payer: Medicaid Other | Admitting: Advanced Practice Midwife

## 2020-04-26 VITALS — BP 127/89 | HR 92 | Wt 145.0 lb

## 2020-04-26 DIAGNOSIS — O09523 Supervision of elderly multigravida, third trimester: Secondary | ICD-10-CM

## 2020-04-26 DIAGNOSIS — Z3480 Encounter for supervision of other normal pregnancy, unspecified trimester: Secondary | ICD-10-CM

## 2020-04-26 DIAGNOSIS — Z331 Pregnant state, incidental: Secondary | ICD-10-CM

## 2020-04-26 DIAGNOSIS — O26893 Other specified pregnancy related conditions, third trimester: Secondary | ICD-10-CM

## 2020-04-26 DIAGNOSIS — Z1389 Encounter for screening for other disorder: Secondary | ICD-10-CM

## 2020-04-26 DIAGNOSIS — R12 Heartburn: Secondary | ICD-10-CM

## 2020-04-26 DIAGNOSIS — Z3A30 30 weeks gestation of pregnancy: Secondary | ICD-10-CM

## 2020-04-26 DIAGNOSIS — O99333 Smoking (tobacco) complicating pregnancy, third trimester: Secondary | ICD-10-CM

## 2020-04-26 MED ORDER — PANTOPRAZOLE SODIUM 20 MG PO TBEC: 20.0000 mg | DELAYED_RELEASE_TABLET | Freq: Every day | ORAL | 5 refills | Status: DC

## 2020-04-26 NOTE — Patient Instructions (Signed)
[facility], I greatly value your feedback.  If you receive a survey following your visit with [place] today, we appreciate you taking the time to fill it out.  Thanks, Philipp Deputy, CNM   [facility] ([address] [place], [place] [address]) Entrance C, located off of [address] valet parking  Go to [email or web address] to register for FREE online childbirth classes   Call the office [phone]) or go to [facility] if:  You begin to have strong, frequent contractions  Your water breaks.  Sometimes it is a big gush of fluid, sometimes it is just a trickle that keeps getting your panties wet or running down your legs  You have vaginal bleeding.  It is normal to have a small amount of spotting if your cervix was checked.   You don't feel your baby moving like normal.  If you don't, get you something to eat and drink and lay down and focus on feeling your baby move.  You should feel at least 10 movements in 2 hours.  If you don't, you should call the office or go to [facility].    Tdap Vaccine  It is recommended that you get the Tdap vaccine during the third trimester of EACH pregnancy to help protect your baby from getting pertussis (whooping cough)  27-36 weeks is the BEST time to do this so that you can pass the protection on to your baby. During pregnancy is better than after pregnancy, but if you are unable to get it during pregnancy it will be offered at the hospital.   You can get this vaccine with [place], at the health department, your family doctor, or some local pharmacies  Everyone who will be around your baby should also be up-to-date on their vaccines before the baby comes. Adults (who are not pregnant) only need 1 dose of Tdap during adulthood.   [place] Pediatricians/Family Doctors:  [facility] [phone]            [facility] [phone]                 [facility] [phone]  (usually not accepting new patients unless you have family there already, you are always welcome to call and ask)       [facility] Department [phone]       [facility] Pediatricians/Family Doctors:   [facility]: [phone]  [facility]: [phone]  [facility]: [phone]  [facility] Doctors:   [facility]: [phone]   [facility]: [phone]  [facility] Doctors:  [facility]: [phone]   Home Blood Pressure Monitoring for Patients   Your provider has recommended that you check your blood pressure (BP) at least once a week at home. If you do not have a blood pressure cuff at home, one will be provided for you. Contact your provider if you have not received your monitor within 1 week.   Helpful Tips for Accurate Home Blood Pressure Checks  . Don't smoke, exercise, or drink caffeine 30 minutes before checking your BP . Use the restroom before checking your BP (a full bladder can raise your pressure) . Relax in a comfortable upright chair . Feet on the ground . Left arm resting comfortably on a flat surface at the level of your heart . Legs uncrossed . Back supported . Sit quietly and don't talk . Place the cuff on your bare  arm . Adjust snuggly, so that only two fingertips can fit between your skin and the top of the cuff . Check 2 readings separated by at least one minute . Keep a log of your BP readings . For a visual, please reference this diagram: http://ccnc.care/bpdiagram  Provider Name: [facility]     Phone: [phone]  Zone 1: ALL CLEAR  Continue to monitor your symptoms:  . BP reading is less than 140 (top number) or less than 90 (bottom number)  . No right upper stomach pain . No headaches or seeing spots . No feeling nauseated or throwing up . No swelling in face and hands  Zone 2: CAUTION Call your doctor's office for  any of the following:  . BP reading is greater than 140 (top number) or greater than 90 (bottom number)  . Stomach pain under your ribs in the middle or right side . Headaches or seeing spots . Feeling nauseated or throwing up . Swelling in face and hands  Zone 3: EMERGENCY  Seek immediate medical care if you have any of the following:  . BP reading is greater than160 (top number) or greater than 110 (bottom number) . Severe headaches not improving with Tylenol . Serious difficulty catching your breath . Any worsening symptoms from Zone 2   Third Trimester of Pregnancy The third trimester is from week 29 through week 42, months 7 through 9. The third trimester is a time when the fetus is growing rapidly. At the end of the ninth month, the fetus is about 20 inches in length and weighs 6-10 pounds.  BODY CHANGES Your body goes through many changes during pregnancy. The changes vary from woman to woman.   Your weight will continue to increase. You can expect to gain 25-35 pounds (11-16 kg) by the end of the pregnancy.  You may begin to get stretch marks on your hips, abdomen, and breasts.  You may urinate more often because the fetus is moving lower into your pelvis and pressing on your bladder.  You may develop or continue to have heartburn as a result of your pregnancy.  You may develop constipation because certain hormones are causing the muscles that push waste through your intestines to slow down.  You may develop hemorrhoids or swollen, bulging veins (varicose veins).  You may have pelvic pain because of the weight gain and pregnancy hormones relaxing your joints between the bones in your pelvis. Backaches may result from overexertion of the muscles supporting your posture.  You may have changes in your hair. These can include thickening of your hair, rapid growth, and changes in texture. Some women also have hair loss during or after pregnancy, or hair that feels dry or thin.  Your hair will most likely return to normal after your baby is born.  Your breasts will continue to grow and be tender. A yellow discharge may leak from your breasts called colostrum.  Your belly button may stick out.  You may feel short of breath because of your expanding uterus.  You may notice the fetus "dropping," or moving lower in your abdomen.  You may have a bloody mucus discharge. This usually occurs a few days to a week before labor begins.  Your cervix becomes thin and soft (effaced) near your due date. WHAT TO EXPECT AT YOUR PRENATAL EXAMS  You will have prenatal exams every 2 weeks until week 36. Then, you will have weekly prenatal exams. During a routine prenatal visit:  You  will be weighed to make sure you and the fetus are growing normally.  Your blood pressure is taken.  Your abdomen will be measured to track your baby's growth.  The fetal heartbeat will be listened to.  Any test results from the previous visit will be discussed.  You may have a cervical check near your due date to see if you have effaced. At around 36 weeks, your caregiver will check your cervix. At the same time, your caregiver will also perform a test on the secretions of the vaginal tissue. This test is to determine if a type of bacteria, Group B streptococcus, is present. Your caregiver will explain this further. Your caregiver may ask you:  What your birth plan is.  How you are feeling.  If you are feeling the baby move.  If you have had any abnormal symptoms, such as leaking fluid, bleeding, severe headaches, or abdominal cramping.  If you have any questions. Other tests or screenings that may be performed during your third trimester include:  Blood tests that check for low iron levels (anemia).  Fetal testing to check the health, activity level, and growth of the fetus. Testing is done if you have certain medical conditions or if there are problems during the pregnancy. FALSE  LABOR You may feel small, irregular contractions that eventually go away. These are called Braxton Hicks contractions, or false labor. Contractions may last for hours, days, or even weeks before true labor sets in. If contractions come at regular intervals, intensify, or become painful, it is best to be seen by your caregiver.  SIGNS OF LABOR   Menstrual-like cramps.  Contractions that are 5 minutes apart or less.  Contractions that start on the top of the uterus and spread down to the lower abdomen and back.  A sense of increased pelvic pressure or back pain.  A watery or bloody mucus discharge that comes from the vagina. If you have any of these signs before the 37th week of pregnancy, call your caregiver right away. You need to go to the hospital to get checked immediately. HOME CARE INSTRUCTIONS   Avoid all smoking, herbs, alcohol, and unprescribed drugs. These chemicals affect the formation and growth of the baby.  Follow your caregiver's instructions regarding medicine use. There are medicines that are either safe or unsafe to take during pregnancy.  Exercise only as directed by your caregiver. Experiencing uterine cramps is a good sign to stop exercising.  Continue to eat regular, healthy meals.  Wear a good support bra for breast tenderness.  Do not use hot tubs, steam rooms, or saunas.  Wear your seat belt at all times when driving.  Avoid raw meat, uncooked cheese, cat litter boxes, and soil used by cats. These carry germs that can cause birth defects in the baby.  Take your prenatal vitamins.  Try taking a stool softener (if your caregiver approves) if you develop constipation. Eat more high-fiber foods, such as fresh vegetables or fruit and whole grains. Drink plenty of fluids to keep your urine clear or pale yellow.  Take warm sitz baths to soothe any pain or discomfort caused by hemorrhoids. Use hemorrhoid cream if your caregiver approves.  If you develop varicose  veins, wear support hose. Elevate your feet for 15 minutes, 3-4 times a day. Limit salt in your diet.  Avoid heavy lifting, wear low heal shoes, and practice good posture.  Rest a lot with your legs elevated if you have leg cramps or low back  pain.  Visit your dentist if you have not gone during your pregnancy. Use a soft toothbrush to brush your teeth and be gentle when you floss.  A sexual relationship may be continued unless your caregiver directs you otherwise.  Do not travel far distances unless it is absolutely necessary and only with the approval of your caregiver.  Take prenatal classes to understand, practice, and ask questions about the labor and delivery.  Make a trial run to the hospital.  Pack your hospital bag.  Prepare the baby's nursery.  Continue to go to all your prenatal visits as directed by your caregiver. SEEK MEDICAL CARE IF:  You are unsure if you are in labor or if your water has broken.  You have dizziness.  You have mild pelvic cramps, pelvic pressure, or nagging pain in your abdominal area.  You have persistent nausea, vomiting, or diarrhea.  You have a bad smelling vaginal discharge.  You have pain with urination. SEEK IMMEDIATE MEDICAL CARE IF:   You have a fever.  You are leaking fluid from your vagina.  You have spotting or bleeding from your vagina.  You have severe abdominal cramping or pain.  You have rapid weight loss or gain.  You have shortness of breath with chest pain.  You notice sudden or extreme swelling of your face, hands, ankles, feet, or legs.  You have not felt your baby move in over an hour.  You have severe headaches that do not go away with medicine.  You have vision changes. Document Released: 10/08/2001 Document Revised: 10/19/2013 Document Reviewed: 12/15/2012 [employer] Patient Information 2015 [place], [place]. This information is not intended to replace advice given to you by your health care provider. Make  sure you discuss any questions you have with your health care provider.

## 2020-04-26 NOTE — Progress Notes (Signed)
   LOW-RISK PREGNANCY VISIT Patient name: Rachel Orr MRN [id number]  Date of birth: [birth year] Chief Complaint:   Routine Prenatal Visit  History of Present Illness:   Rachel Orr is a 39 y.o. [id number] female at [redacted]w[redacted]d with an Estimated Date of Delivery: 06/30/20 being seen today for ongoing management of a low-risk pregnancy.  Today she reports heartburn. Contractions: Not present. Vag. Bleeding: None.  Movement: Present. denies leaking of fluid. Review of Systems:   Pertinent items are noted in HPI Denies abnormal vaginal discharge w/ itching/odor/irritation, headaches, visual changes, shortness of breath, chest pain, abdominal pain, severe nausea/vomiting, or problems with urination or bowel movements unless otherwise stated above. Pertinent History Reviewed:  Reviewed past medical,surgical, social, obstetrical and family history.  Reviewed problem list, medications and allergies. Physical Assessment:   Vitals:   04/26/20 1035  BP: 127/89  Pulse: 92  Weight: 145 lb (65.8 kg)  Body mass index is 24.89 kg/m.        Physical Examination:   General appearance: Well appearing, and in no distress  Mental status: Alert, oriented to person, place, and time  Skin: Warm & dry  Cardiovascular: Normal heart rate noted  Respiratory: Normal respiratory effort, no distress  Abdomen: Soft, gravid, nontender  Pelvic: Cervical exam deferred         Extremities: Edema: None  Fetal Status: Fetal Heart Rate (bpm): 154 Fundal Height: 29 cm Movement: Present    No results found for this or any previous visit (from the past 24 hour(s)).  Assessment & Plan:  1) Low-risk pregnancy [id number] at [redacted]w[redacted]d with an Estimated Date of Delivery: 06/30/20   2) AMA <40yo, routine care  3) Smoker  4) Heartburn, refill Protonix   Meds:  Meds ordered this encounter  Medications  . pantoprazole (PROTONIX) 20 MG tablet    Sig: Take 1 tablet (20 mg total) by mouth daily.    Dispense:  30 tablet    Refill:   5    Order Specific Question:   Supervising Provider    Answer:   Galen Daft   Labs/procedures today: none  Plan:  Continue routine obstetrical care   Reviewed: Preterm labor symptoms and general obstetric precautions including but not limited to vaginal bleeding, contractions, leaking of fluid and fetal movement were reviewed in detail with the patient.  All questions were answered. Doesn't have home bp cuff. Check bp weekly, let us know if >140/90.   Follow-up: Return in about 2 weeks (around 05/10/2020) for LROB, in person.  Orders Placed This Encounter  Procedures  . POC Urinalysis Dipstick OB   Arabella Merles CNM 04/26/2020 11:00 AM

## 2020-05-10 ENCOUNTER — Ambulatory Visit (INDEPENDENT_AMBULATORY_CARE_PROVIDER_SITE_OTHER): Payer: Medicaid Other | Admitting: Advanced Practice Midwife

## 2020-05-10 VITALS — BP 115/77 | HR 91 | Wt 148.0 lb

## 2020-05-10 DIAGNOSIS — Z331 Pregnant state, incidental: Secondary | ICD-10-CM

## 2020-05-10 DIAGNOSIS — Z1389 Encounter for screening for other disorder: Secondary | ICD-10-CM

## 2020-05-10 DIAGNOSIS — Z3483 Encounter for supervision of other normal pregnancy, third trimester: Secondary | ICD-10-CM

## 2020-05-10 DIAGNOSIS — Z3A32 32 weeks gestation of pregnancy: Secondary | ICD-10-CM

## 2020-05-10 NOTE — Progress Notes (Signed)
   PRENATAL VISIT NOTE  Subjective:  Rachel Orr is a 39 y.o. G5P4004 at [redacted]w[redacted]d being seen today for ongoing prenatal care.  She is currently monitored for the following issues for this low-risk pregnancy and has Encounter for supervision of other normal pregnancy, unspecified trimester; Smoker; and Pyelonephritis affecting pregnancy in second trimester on their problem list.  Patient reports no complaints.  Contractions: Not present. Vag. Bleeding: None.  Movement: Present. Denies leaking of fluid.   The following portions of the patient's history were reviewed and updated as appropriate: allergies, current medications, past family history, past medical history, past social history, past surgical history and problem list.   Objective:   Vitals:   05/10/20 0947  BP: 115/77  Pulse: 91  Weight: 148 lb (67.1 kg)    Fetal Status: Fetal Heart Rate (bpm): 150 Fundal Height: 32 cm Movement: Present     General:  Alert, oriented and cooperative. Patient is in no acute distress.  Skin: Skin is warm and dry. No rash noted.   Cardiovascular: Normal heart rate noted  Respiratory: Normal respiratory effort, no problems with respiration noted  Abdomen: Soft, gravid, appropriate for gestational age.  Pain/Pressure: Present     Pelvic: Cervical exam deferred        Extremities: Normal range of motion.  Edema: None  Mental Status: Normal mood and affect. Normal behavior. Normal judgment and thought content.   Assessment and Plan:  Pregnancy: G5P4004 at [redacted]w[redacted]d 1. Encounter for supervision of other normal pregnancy in third trimester - POC Urinalysis Dipstick OB - Breech by leopolds today.    Preterm labor symptoms and general obstetric precautions including but not limited to vaginal bleeding, contractions, leaking of fluid and fetal movement were reviewed in detail with the patient. Please refer to After Visit Summary for other counseling recommendations.   Return in about 2 weeks (around  05/24/2020).  No future appointments.  Thressa Sheller DNP, CNM  05/10/20  10:11 AM

## 2020-05-24 ENCOUNTER — Ambulatory Visit (INDEPENDENT_AMBULATORY_CARE_PROVIDER_SITE_OTHER): Payer: Medicaid Other | Admitting: Advanced Practice Midwife

## 2020-05-24 ENCOUNTER — Encounter: Payer: Self-pay | Admitting: Advanced Practice Midwife

## 2020-05-24 VITALS — BP 127/81 | HR 91 | Wt 147.5 lb

## 2020-05-24 DIAGNOSIS — Z331 Pregnant state, incidental: Secondary | ICD-10-CM

## 2020-05-24 DIAGNOSIS — Z1389 Encounter for screening for other disorder: Secondary | ICD-10-CM

## 2020-05-24 DIAGNOSIS — Z3480 Encounter for supervision of other normal pregnancy, unspecified trimester: Secondary | ICD-10-CM

## 2020-05-24 DIAGNOSIS — Z3A34 34 weeks gestation of pregnancy: Secondary | ICD-10-CM

## 2020-05-24 LAB — POCT URINALYSIS DIPSTICK OB
Blood, UA: NEGATIVE
Glucose, UA: NEGATIVE
Ketones, UA: NEGATIVE
Leukocytes, UA: NEGATIVE
Nitrite, UA: NEGATIVE
POC,PROTEIN,UA: NEGATIVE

## 2020-05-24 NOTE — Patient Instructions (Signed)
Braxton Hicks Contractions °Contractions of the uterus can occur throughout pregnancy, but they are not always a sign that you are in labor. You may have practice contractions called Braxton Hicks contractions. These false labor contractions are sometimes confused with true labor. °What are Braxton Hicks contractions? °Braxton Hicks contractions are tightening movements that occur in the muscles of the uterus before labor. Unlike true labor contractions, these contractions do not result in opening (dilation) and thinning of the cervix. Toward the end of pregnancy (32-34 weeks), Braxton Hicks contractions can happen more often and may become stronger. These contractions are sometimes difficult to tell apart from true labor because they can be very uncomfortable. You should not feel embarrassed if you go to the hospital with false labor. °Sometimes, the only way to tell if you are in true labor is for your health care provider to look for changes in the cervix. The health care provider will do a physical exam and may monitor your contractions. If you are not in true labor, the exam should show that your cervix is not dilating and your water has not broken. °If there are no other health problems associated with your pregnancy, it is completely safe for you to be sent home with false labor. You may continue to have Braxton Hicks contractions until you go into true labor. °How to tell the difference between true labor and false labor °True labor °· Contractions last 30-70 seconds. °· Contractions become very regular. °· Discomfort is usually felt in the top of the uterus, and it spreads to the lower abdomen and low back. °· Contractions do not go away with walking. °· Contractions usually become more intense and increase in frequency. °· The cervix dilates and gets thinner. °False labor °· Contractions are usually shorter and not as strong as true labor contractions. °· Contractions are usually irregular. °· Contractions  are often felt in the front of the lower abdomen and in the groin. °· Contractions may go away when you walk around or change positions while lying down. °· Contractions get weaker and are shorter-lasting as time goes on. °· The cervix usually does not dilate or become thin. °Follow these instructions at home: ° °· Take over-the-counter and prescription medicines only as told by your health care provider. °· Keep up with your usual exercises and follow other instructions from your health care provider. °· Eat and drink lightly if you think you are going into labor. °· If Braxton Hicks contractions are making you uncomfortable: °? Change your position from lying down or resting to walking, or change from walking to resting. °? Sit and rest in a tub of warm water. °? Drink enough fluid to keep your urine pale yellow. Dehydration may cause these contractions. °? Do slow and deep breathing several times an hour. °· Keep all follow-up prenatal visits as told by your health care provider. This is important. °Contact a health care provider if: °· You have a fever. °· You have continuous pain in your abdomen. °Get help right away if: °· Your contractions become stronger, more regular, and closer together. °· You have fluid leaking or gushing from your vagina. °· You pass blood-tinged mucus (bloody show). °· You have bleeding from your vagina. °· You have low back pain that you never had before. °· You feel your baby’s head pushing down and causing pelvic pressure. °· Your baby is not moving inside you as much as it used to. °Summary °· Contractions that occur before labor are   called Braxton Hicks contractions, false labor, or practice contractions. °· Braxton Hicks contractions are usually shorter, weaker, farther apart, and less regular than true labor contractions. True labor contractions usually become progressively stronger and regular, and they become more frequent. °· Manage discomfort from Braxton Hicks contractions  by changing position, resting in a warm bath, drinking plenty of water, or practicing deep breathing. °This information is not intended to replace advice given to you by your health care provider. Make sure you discuss any questions you have with your health care provider. °Document Revised: 09/26/2017 Document Reviewed: 02/27/2017 °Elsevier Patient Education © 2020 Elsevier Inc. ° °

## 2020-05-24 NOTE — Progress Notes (Signed)
   LOW-RISK PREGNANCY VISIT Patient name: Rachel Orr MRN [id number]  Date of birth: 1981/01/14 Chief Complaint:   Routine Prenatal Visit (congestion, heart burn, pressure)  History of Present Illness:   Rachel Orr is a 39 y.o. [id number] female at [redacted]w[redacted]d with an Estimated Date of Delivery: 06/30/20 being seen today for ongoing management of a low-risk pregnancy.  Today she reports heartburn (taking Tums and Mylanta). Contractions: Not present. Vag. Bleeding: None.  Movement: Present. denies leaking of fluid. Review of Systems:   Pertinent items are noted in HPI Denies abnormal vaginal discharge w/ itching/odor/irritation, headaches, visual changes, shortness of breath, chest pain, abdominal pain, severe nausea/vomiting, or problems with urination or bowel movements unless otherwise stated above. Pertinent History Reviewed:  Reviewed past medical,surgical, social, obstetrical and family history.  Reviewed problem list, medications and allergies. Physical Assessment:   Vitals:   05/24/20 1204  BP: 127/81  Pulse: 91  Weight: 147 lb 8 oz (66.9 kg)  Body mass index is 25.32 kg/m.        Physical Examination:   General appearance: Well appearing, and in no distress  Mental status: Alert, oriented to person, place, and time  Skin: Warm & dry  Cardiovascular: Normal heart rate noted  Respiratory: Normal respiratory effort, no distress  Abdomen: Soft, gravid, nontender  Pelvic: Cervical exam deferred         Extremities: Edema: None  Fetal Status: Fetal Heart Rate (bpm): 154 Fundal Height: 33 cm Movement: Present    Results for orders placed or performed in visit on 05/24/20 (from the past 24 hour(s))  POC Urinalysis Dipstick OB   Collection Time: 05/24/20 12:05 PM  Result Value Ref Range   Color, UA     Clarity, UA     Glucose, UA Negative Negative   Bilirubin, UA     Ketones, UA neg    Spec Grav, UA     Blood, UA neg    pH, UA     POC,PROTEIN,UA Negative Negative, Trace,  Small (1+), Moderate (2+), Large (3+), 4+   Urobilinogen, UA     Nitrite, UA neg    Leukocytes, UA Negative Negative   Appearance     Odor      Assessment & Plan:  1) Low-risk pregnancy G5P4004 at [redacted]w[redacted]d with an Estimated Date of Delivery: 06/30/20; vertex by Leopold's> will check at next visit with bedside u/s   Meds: No orders of the defined types were placed in this encounter.  Labs/procedures today: none  Plan:  Continue routine obstetrical care with GBS/cultures and verify presentation with bedside u/s at next visit  Reviewed: Preterm labor symptoms and general obstetric precautions including but not limited to vaginal bleeding, contractions, leaking of fluid and fetal movement were reviewed in detail with the patient.  All questions were answered. Didn't ask about home bp cuff. Check bp weekly, let us know if >140/90.   Follow-up: Return in about 2 weeks (around 06/07/2020) for LROB, in person (GBS & cultures).  Orders Placed This Encounter  Procedures  . POC Urinalysis Dipstick OB   Arabella [facility]les [facility] 05/24/2020 12:20 PM

## 2020-06-04 ENCOUNTER — Inpatient Hospital Stay (HOSPITAL_COMMUNITY)
Admission: AD | Admit: 2020-06-04 | Discharge: 2020-06-04 | Disposition: A | Discharge status: Home / Self Care | Payer: Medicaid Other | Attending: Obstetrics & Gynecology | Admitting: Obstetrics & Gynecology

## 2020-06-04 ENCOUNTER — Encounter (HOSPITAL_COMMUNITY): Payer: Self-pay | Admitting: Obstetrics & Gynecology

## 2020-06-04 ENCOUNTER — Other Ambulatory Visit: Payer: Self-pay

## 2020-06-04 DIAGNOSIS — R102 Pelvic and perineal pain: Secondary | ICD-10-CM | POA: Diagnosis not present

## 2020-06-04 DIAGNOSIS — O99333 Smoking (tobacco) complicating pregnancy, third trimester: Secondary | ICD-10-CM | POA: Diagnosis not present

## 2020-06-04 DIAGNOSIS — Z79899 Other long term (current) drug therapy: Secondary | ICD-10-CM | POA: Diagnosis not present

## 2020-06-04 DIAGNOSIS — O26899 Other specified pregnancy related conditions, unspecified trimester: Secondary | ICD-10-CM

## 2020-06-04 DIAGNOSIS — O99613 Diseases of the digestive system complicating pregnancy, third trimester: Secondary | ICD-10-CM

## 2020-06-04 DIAGNOSIS — O09523 Supervision of elderly multigravida, third trimester: Secondary | ICD-10-CM | POA: Diagnosis not present

## 2020-06-04 DIAGNOSIS — K219 Gastro-esophageal reflux disease without esophagitis: Secondary | ICD-10-CM | POA: Diagnosis not present

## 2020-06-04 DIAGNOSIS — O26893 Other specified pregnancy related conditions, third trimester: Secondary | ICD-10-CM | POA: Diagnosis present

## 2020-06-04 DIAGNOSIS — Z3A36 36 weeks gestation of pregnancy: Secondary | ICD-10-CM | POA: Diagnosis not present

## 2020-06-04 DIAGNOSIS — F17210 Nicotine dependence, cigarettes, uncomplicated: Secondary | ICD-10-CM | POA: Insufficient documentation

## 2020-06-04 LAB — URINALYSIS, ROUTINE W REFLEX MICROSCOPIC
Bilirubin Urine: NEGATIVE
Glucose, UA: NEGATIVE mg/dL
Hgb urine dipstick: NEGATIVE
Ketones, ur: NEGATIVE mg/dL
Leukocytes,Ua: NEGATIVE
Nitrite: NEGATIVE
Protein, ur: NEGATIVE mg/dL
Specific Gravity, Urine: 1.013 (ref 1.005–1.030)
pH: 7 (ref 5.0–8.0)

## 2020-06-04 MED ORDER — OMEPRAZOLE 20 MG PO CPDR: 20.0000 mg | DELAYED_RELEASE_CAPSULE | Freq: Every day | ORAL | 0 refills | Status: DC

## 2020-06-04 MED ORDER — ALUM & MAG HYDROXIDE-SIMETH 200-200-20 MG/5ML PO SUSP
30.0000 mL | Freq: Once | ORAL | Status: AC
  Administered 2020-06-04: 30 mL via ORAL
  Filled 2020-06-04: qty 30

## 2020-06-04 MED ORDER — MISC. DEVICES MISC
0 refills | Status: DC
Start: 2020-06-04 — End: 2020-06-26

## 2020-06-04 NOTE — MAU Provider Note (Signed)
History     CSN: [id number]  Arrival date and time: 06/04/20 1631   First Provider Initiated Contact with Patient 06/04/20 1720      Chief Complaint  Patient presents with  . Abdominal Pain  . Back Pain  . Gastroesophageal Reflux   HPI Rachel Orr is a 39 y.o. G5P4004 at [redacted]w[redacted]d who presents to MAU with multple complaints. She denies vaginal bleeding, leaking of fluid, decreased fetal movement, fever, falls, or recent illness.   Pelvic pain and irregular contractions This is a recurrent problem., Patient endorses pain in the middle of her vagina when she walks, stands or repositions. She endorses occasional mild to moderate contractions. She has not taken medication or tried other treatments for this complaint. She does not have a maternity belt.  Acid reflux This is a recurrent problem. Patient is managing her discomfort with Tums and by drinking milk. She previously took Protonix with moderate relief but experienced dizziness and was advised to discontinue the medicine.  "Wrong Dating" Patient endorses formal ultrasound at 8 weeks but believes she was told she is two weeks earlier and is concerned that she does not know her own due date.   OB History    Gravida  5   Para  4   Term  4   Preterm      AB      Living  4     SAB      TAB      Ectopic      Multiple      Live Births  4           Past Medical History:  Diagnosis Date  . Anemia     Past Surgical History:  Procedure Laterality Date  . NO PAST SURGERIES      Family History  Problem Relation Age of Onset  . Asthma Son   . Hypertension Son   . Hypertension Father   . COPD Father   . Hypertension Sister   . Hypertension Paternal Uncle   . Diabetes Paternal Grandmother   . Hypertension Paternal Grandmother   . Asthma Paternal Grandmother   . COPD Paternal Grandmother     Social History   Tobacco Use  . Smoking status: Current Every Day Smoker    Packs/day: 1.00    Types:  Cigarettes  . Smokeless tobacco: Never Used  Vaping Use  . Vaping Use: Never used  Substance Use Topics  . Alcohol use: No  . Drug use: No    Allergies: No Known Allergies  Medications Prior to Admission  Medication Sig Dispense Refill Last Dose  . Acetaminophen (TYLENOL PO) Take by mouth as needed.   06/04/2020 at Unknown time  . Prenatal Vit-Fe Fumarate-FA (PRENATAL COMPLETE) 14-0.4 MG TABS Take one tablet daily 30 tablet 11 06/03/2020 at Unknown time  . Blood Pressure Monitor MISC For regular home bp monitoring during pregnancy (Patient not taking: Reported on 01/26/2020) 1 each 0     Review of Systems  Gastrointestinal: Positive for abdominal pain.  Genitourinary: Positive for pelvic pain. Negative for vaginal bleeding, vaginal discharge and vaginal pain.  All other systems reviewed and are negative.  Physical Exam   Blood pressure 125/79, pulse 90, temperature 97.9 F (36.6 C), temperature source Oral, resp. rate 16, height 5\' 4"  (1.626 m), weight 66.7 kg, last menstrual period 10/29/2019, SpO2 97 %, unknown if currently breastfeeding.  Physical Exam Vitals and nursing note reviewed. Exam conducted with a chaperone present.  Cardiovascular:     Rate and Rhythm: Normal rate.     Heart sounds: Normal heart sounds.  Pulmonary:     Effort: Pulmonary effort is normal.     Breath sounds: Normal breath sounds.  Abdominal:     Palpations: Abdomen is soft.     Tenderness: There is no abdominal tenderness. There is no right CVA tenderness or left CVA tenderness.     Comments: Gravid  Skin:    General: Skin is warm and dry.     Capillary Refill: Capillary refill takes less than 2 seconds.  Neurological:     General: No focal deficit present.     Mental Status: She is alert.  Psychiatric:        Mood and Affect: Mood normal.        Behavior: Behavior normal.     MAU Course  Procedures  --Reactive tracing: baseline 140, mod variability, positive accels, no decels --Toco:  rare contractions, palpate mild --Cervix closed --OB history term SVD x 4  Patient Vitals for the past 24 hrs:  BP Temp Temp src Pulse Resp SpO2 Height Weight  06/04/20 1815 114/69 97.9 F (36.6 C) Oral 81 16 97 % -- --  06/04/20 1810 -- -- -- -- -- 96 % -- --  06/04/20 1805 -- -- -- -- -- 97 % -- --  06/04/20 1800 -- -- -- -- -- 97 % -- --  06/04/20 1755 -- -- -- -- -- 98 % -- --  06/04/20 1745 -- -- -- -- -- 97 % -- --  06/04/20 1740 -- -- -- -- -- 97 % -- --  06/04/20 1730 -- -- -- -- -- 98 % -- --  06/04/20 1715 -- -- -- -- -- 97 % -- --  06/04/20 1714 125/79 97.9 F (36.6 C) Oral 90 16 -- -- --  06/04/20 1710 -- -- -- -- -- 98 % -- --  06/04/20 1654 -- -- -- -- -- -- 5\' 4"  (1.626 m) 66.7 kg   Results for orders placed or performed during the hospital encounter of 06/04/20 (from the past 24 hour(s))  Urinalysis, Routine w reflex microscopic Urine, Clean Catch     Status: Abnormal   Collection Time: 06/04/20  5:22 PM  Result Value Ref Range   Color, Urine AMBER (A) YELLOW   APPearance CLOUDY (A) CLEAR   Specific Gravity, Urine 1.013 1.005 - 1.030   pH 7.0 5.0 - 8.0   Glucose, UA NEGATIVE NEGATIVE mg/dL   Hgb urine dipstick NEGATIVE NEGATIVE   Bilirubin Urine NEGATIVE NEGATIVE   Ketones, ur NEGATIVE NEGATIVE mg/dL   Protein, ur NEGATIVE NEGATIVE mg/dL   Nitrite NEGATIVE NEGATIVE   Leukocytes,Ua NEGATIVE NEGATIVE   Meds ordered this encounter  Medications  . alum & mag hydroxide-simeth (MAALOX/MYLANTA) 200-200-20 MG/5ML suspension 30 mL  . Misc. Devices MISC    Sig: Dispense one maternity belt for patient    Dispense:  1 each    Refill:  0    Order Specific Question:   Supervising Provider    Answer:   05-06-2001 A [[id number]]  . omeprazole (PRILOSEC) 20 MG capsule    Sig: Take 1 capsule (20 mg total) by mouth daily.    Dispense:  30 capsule    Refill:  0    Order Specific Question:   Supervising Provider    Answer:   Jaynie Collins A [[id number]]   Assessment and  Plan  --39 y.o. 20 at [redacted]w[redacted]d  --  Dating by 8 week formal scan, no mention in chart of alternate date --Reactive tracing --Closed cervix --Round ligament and pubic symphysis pain --GERD --Discharge home in stable condition  F/U: --Next appt [facility] 06/06/2020  Calvert Cantor, CNM 06/04/2020, 7:04 PM

## 2020-06-04 NOTE — MAU Note (Signed)
Nurse at Ob/Gyn called to check on her on Friday, was feeling fine on Friday but then on Saturday, was feeling pelvic pressure when walking, would be "8" on pain scale and back pain, like is being poked with a needle, was 3-4 on pain scale .  Took Tylenol, it helped and back pain now 2 on pain scale.   Baby moving well.  No vaginal bleeding.  No leaking.  Vomited at 2 am, having very bad heartburn and drinking milk and taking Tums often and not helping.   Feels contractions every day and not timing because been going off and on for month.

## 2020-06-04 NOTE — Discharge Instructions (Signed)
PREGNANCY SUPPORT BELT: You are not alone, Seventy-five percent of women have some sort of abdominal or back pain at some point in their pregnancy. Your baby is growing at a fast pace, which means that your whole body is rapidly trying to adjust to the changes. As your uterus grows, your back may start feeling a bit under stress and this can result in back or abdominal pain that can go from mild, and therefore bearable, to severe pains that will not allow you to sit or lay down comfortably, When it comes to dealing with pregnancy-related pains and cramps, some pregnant women usually prefer natural remedies, which the market is filled with nowadays. For example, wearing a pregnancy support belt can help ease and lessen your discomfort and pain. WHAT ARE THE BENEFITS OF WEARING A PREGNANCY SUPPORT BELT? A pregnancy support belt provides support to the lower portion of the belly taking some of the weight of the growing uterus and distributing to the other parts of your body. It is designed make you comfortable and gives you extra support. Over the years, the pregnancy apparel market has been studying the needs and wants of pregnant women and they have come up with the most comfortable pregnancy support belts that woman could ever ask for. In fact, you will no longer have to wear a stretched-out or bulky pregnancy belt that is visible underneath your clothes and makes you feel even more uncomfortable. Nowadays, a pregnancy support belt is made of comfortable and stretchy materials that will not irritate your skin but will actually make you feel at ease and you will not even notice you are wearing it. They are easy to put on and adjust during the day and can be worn at night for additional support.  BENEFITS: . Relives Back pain . Relieves Abdominal Muscle and Leg Pain . Stabilizes the Pelvic Ring . Offers a Cushioned Abdominal Lift Pad . Relieves pressure on the Sciatic Nerve Within Minutes WHERE TO GET  YOUR PREGNANCY BELT: [employer] [phone] @2301  [address] [place], [place]     Gastroesophageal Reflux Disease, Adult Gastroesophageal reflux (GER) happens when acid from the stomach flows up into the tube that connects the mouth and the stomach (esophagus). Normally, food travels down the esophagus and stays in the stomach to be digested. With GER, food and stomach acid sometimes move back up into the esophagus. You may have a disease called gastroesophageal reflux disease (GERD) if the reflux:  Happens often.  Causes frequent or very bad symptoms.  Causes problems such as damage to the esophagus. When this happens, the esophagus becomes sore and swollen (inflamed). Over time, GERD can make small holes (ulcers) in the lining of the esophagus. What are the causes? This condition is caused by a problem with the muscle between the esophagus and the stomach. When this muscle is weak or not normal, it does not close properly to keep food and acid from coming back up from the stomach. The muscle can be weak because of:  Tobacco use.  Pregnancy.  Having a certain type of hernia (hiatal hernia).  Alcohol use.  Certain foods and drinks, such as coffee, chocolate, onions, and peppermint. What increases the risk? You are more likely to develop this condition if you:  Are overweight.  Have a disease that affects your connective tissue.  Use NSAID medicines. What are the signs or symptoms? Symptoms of this condition include:  Heartburn.  Difficult or painful swallowing.  The feeling of having a lump in the throat.  A bitter taste in the mouth.  Bad breath.  Having a lot of saliva.  Having an upset or bloated stomach.  Belching.  Chest pain. Different conditions can cause chest pain. Make sure you see your doctor if you have chest pain.  Shortness of breath or noisy breathing (wheezing).  Ongoing (chronic) cough or a cough at  night.  Wearing away of the surface of teeth (tooth enamel).  Weight loss. How is this treated? Treatment will depend on how bad your symptoms are. Your doctor may suggest:  Changes to your diet.  Medicine.  Surgery. Follow these instructions at home: Eating and drinking   Follow a diet as told by your doctor. You may need to avoid foods and drinks such as: ? Coffee and tea (with or without caffeine). ? Drinks that contain alcohol. ? Energy drinks and sports drinks. ? Bubbly (carbonated) drinks or sodas. ? Chocolate and cocoa. ? Peppermint and mint flavorings. ? Garlic and onions. ? Horseradish. ? Spicy and acidic foods. These include peppers, chili powder, curry powder, vinegar, hot sauces, and BBQ sauce. ? Citrus fruit juices and citrus fruits, such as oranges, lemons, and limes. ? Tomato-based foods. These include red sauce, chili, salsa, and pizza with red sauce. ? Fried and fatty foods. These include donuts, french fries, potato chips, and high-fat dressings. ? High-fat meats. These include hot dogs, rib eye steak, sausage, ham, and bacon. ? High-fat dairy items, such as whole milk, butter, and cream cheese.  Eat small meals often. Avoid eating large meals.  Avoid drinking large amounts of liquid with your meals.  Avoid eating meals during the 2-3 hours before bedtime.  Avoid lying down right after you eat.  Do not exercise right after you eat. Lifestyle   Do not use any products that contain nicotine or tobacco. These include cigarettes, e-cigarettes, and chewing tobacco. If you need help quitting, ask your doctor.  Try to lower your stress. If you need help doing this, ask your doctor.  If you are overweight, lose an amount of weight that is healthy for you. Ask your doctor about a safe weight loss goal. General instructions  Pay attention to any changes in your symptoms.  Take over-the-counter and prescription medicines only as told by your doctor. Do not  take aspirin, ibuprofen, or other NSAIDs unless your doctor says it is okay.  Wear loose clothes. Do not wear anything tight around your waist.  Raise (elevate) the head of your bed about 6 inches (15 cm).  Avoid bending over if this makes your symptoms worse.  Keep all follow-up visits as told by your doctor. This is important. Contact a doctor if:  You have new symptoms.  You lose weight and you do not know why.  You have trouble swallowing or it hurts to swallow.  You have wheezing or a cough that keeps happening.  Your symptoms do not get better with treatment.  You have a hoarse voice. Get help right away if:  You have pain in your arms, neck, jaw, teeth, or back.  You feel sweaty, dizzy, or light-headed.  You have chest pain or shortness of breath.  You throw up (vomit) and your throw-up looks like blood or coffee grounds.  You pass out (faint).  Your poop (stool) is bloody or black.  You cannot swallow, drink, or eat. Summary  If a person has gastroesophageal reflux disease (GERD), food and stomach acid move  back up into the esophagus and cause symptoms or problems such as damage to the esophagus.  Treatment will depend on how bad your symptoms are.  Follow a diet as told by your doctor.  Take all medicines only as told by your doctor. This information is not intended to replace advice given to you by your health care provider. Make sure you discuss any questions you have with your health care provider. Document Revised: 04/22/2018 Document Reviewed: 04/22/2018 Elsevier Patient Education  2020 [employer].

## 2020-06-06 ENCOUNTER — Telehealth: Payer: Self-pay | Admitting: Obstetrics & Gynecology

## 2020-06-06 ENCOUNTER — Ambulatory Visit (INDEPENDENT_AMBULATORY_CARE_PROVIDER_SITE_OTHER): Payer: Medicaid Other | Admitting: Obstetrics & Gynecology

## 2020-06-06 ENCOUNTER — Other Ambulatory Visit (HOSPITAL_COMMUNITY)
Admission: RE | Admit: 2020-06-06 | Discharge: 2020-06-06 | Disposition: A | Discharge status: Home / Self Care | Payer: Medicaid Other | Source: Ambulatory Visit | Attending: Obstetrics & Gynecology | Admitting: Obstetrics & Gynecology

## 2020-06-06 VITALS — BP 123/80 | HR 91 | Wt 147.0 lb

## 2020-06-06 DIAGNOSIS — Z1389 Encounter for screening for other disorder: Secondary | ICD-10-CM

## 2020-06-06 DIAGNOSIS — Z3A36 36 weeks gestation of pregnancy: Secondary | ICD-10-CM

## 2020-06-06 DIAGNOSIS — Z113 Encounter for screening for infections with a predominantly sexual mode of transmission: Secondary | ICD-10-CM | POA: Diagnosis present

## 2020-06-06 DIAGNOSIS — Z331 Pregnant state, incidental: Secondary | ICD-10-CM | POA: Diagnosis not present

## 2020-06-06 DIAGNOSIS — Z3480 Encounter for supervision of other normal pregnancy, unspecified trimester: Secondary | ICD-10-CM | POA: Diagnosis not present

## 2020-06-06 LAB — POCT URINALYSIS DIPSTICK OB
Blood, UA: NEGATIVE
Glucose, UA: NEGATIVE
Ketones, UA: NEGATIVE
Leukocytes, UA: NEGATIVE
Nitrite, UA: NEGATIVE
POC,PROTEIN,UA: NEGATIVE

## 2020-06-06 NOTE — Telephone Encounter (Signed)
Telephoned patient at home number and left message to return call.  

## 2020-06-06 NOTE — Telephone Encounter (Signed)
That is up to patinet, no medical reason to but that is up to her

## 2020-06-06 NOTE — Progress Notes (Signed)
LOW-RISK PREGNANCY VISIT Patient name: Rachel Orr MRN [id number]  Date of birth: 13-Feb-1981 Chief Complaint:   Routine Prenatal Visit  History of Present Illness:   Rachel Orr is a 39 y.o. [id number] female at [redacted]w[redacted]d with an Estimated Date of Delivery: 06/30/20 being seen today for ongoing management of a low-risk pregnancy.  Depression screen [facility] 2/9 03/29/2020 12/22/2019  Decreased Interest 3 0  Down, Depressed, Hopeless 3 0  PHQ - 2 Score 6 0  Altered sleeping 3 1  Tired, decreased energy 3 0  Change in appetite 3 2  Feeling bad or failure about yourself  3 0  Trouble concentrating 3 0  Moving slowly or fidgety/restless 3 0  Suicidal thoughts 3 0  PHQ-9 Score 27 3  Difficult doing work/chores - Not difficult at all    Today she reports backache. Contractions: Irritability. Vag. Bleeding: None.  Movement: Present. reports leaking of fluid. Review of Systems:   Pertinent items are noted in HPI Denies abnormal vaginal discharge w/ itching/odor/irritation, headaches, visual changes, shortness of breath, chest pain, abdominal pain, severe nausea/vomiting, or problems with urination or bowel movements unless otherwise stated above. Pertinent History Reviewed:  Reviewed past medical,surgical, social, obstetrical and family history.  Reviewed problem list, medications and allergies. Physical Assessment:   Vitals:   06/06/20 1102  BP: 123/80  Pulse: 91  Weight: 147 lb (66.7 kg)  Body mass index is 25.23 kg/m.        Physical Examination:   General appearance: Well appearing, and in no distress  Mental status: Alert, oriented to person, place, and time  Skin: Warm & dry  Cardiovascular: Normal heart rate noted  Respiratory: Normal respiratory effort, no distress  Abdomen: Soft, gravid, nontender  Pelvic: Cervical exam performed         Extremities: Edema: None SSE -pool -fern-nitrazine Fetal Status:     Movement: Present    Chaperone: Rachel Orr    Results for  orders placed or performed in visit on 06/06/20 (from the past 24 hour(s))  POC Urinalysis Dipstick OB   Collection Time: 06/06/20 11:00 AM  Result Value Ref Range   Color, UA     Clarity, UA     Glucose, UA Negative Negative   Bilirubin, UA     Ketones, UA n    Spec Grav, UA     Blood, UA n    pH, UA     POC,PROTEIN,UA Negative Negative, Trace, Small (1+), Moderate (2+), Large (3+), 4+   Urobilinogen, UA     Nitrite, UA n    Leukocytes, UA Negative Negative   Appearance     Odor      Assessment & Plan:  1) Low-risk pregnancy G5P4004 at 102w4d with an Estimated Date of Delivery: 06/30/20   2) IBOW, mucous + urine probably,    Meds: No orders of the defined types were placed in this encounter.  Labs/procedures today: SSE negative cultures done  Plan:  Continue routine obstetrical care  Next visit: prefers in person    Reviewed: Term labor symptoms and general obstetric precautions including but not limited to vaginal bleeding, contractions, leaking of fluid and fetal movement were reviewed in detail with the patient.  All questions were answered.  home bp cuff. Rx faxed to . Check bp weekly, let us know if >140/90.   Follow-up: Return in about 10 days (around 06/16/2020) for LROB.  Orders Placed This Encounter  Procedures   Strep Gp B NAA  POC Urinalysis Dipstick OB   Rachel Orr Rachel Orr  06/06/2020 11:55 AM

## 2020-06-06 NOTE — Telephone Encounter (Signed)
Patient called stating that she for got to ask Dr. Despina Hidden if she should start her maternity leave. Please contact pt

## 2020-06-07 LAB — CERVICOVAGINAL ANCILLARY ONLY
Chlamydia: NEGATIVE
Comment: NEGATIVE
Comment: NORMAL
Neisseria Gonorrhea: NEGATIVE

## 2020-06-07 NOTE — Telephone Encounter (Signed)
Telephoned patient at home number and voicemail box not set up. Sent mychart message to patient.

## 2020-06-08 ENCOUNTER — Telehealth: Payer: Self-pay | Admitting: *Deleted

## 2020-06-08 ENCOUNTER — Telehealth: Payer: Self-pay | Admitting: Obstetrics & Gynecology

## 2020-06-08 LAB — STREP GP B NAA: Strep Gp B NAA: POSITIVE — AB

## 2020-06-08 NOTE — Telephone Encounter (Signed)
Spoke with patient regarding GBS+ status.  Patient concerned about this finding wanting to know why we would not treat now.  Discussed at length the reason and need for antibiotics at time of ROM and during labor.  At the end of our conversation, pt verbalized understanding and no further questions.

## 2020-06-08 NOTE — Telephone Encounter (Signed)
Pt called wanting results of labs said she got a my chart message and does not understand it . I could not find the my chart message on my end. She was talking about a lab result that came back or something. Please cal pt and advise

## 2020-06-15 ENCOUNTER — Telehealth: Payer: Self-pay | Admitting: *Deleted

## 2020-06-15 ENCOUNTER — Telehealth: Payer: Self-pay | Admitting: Women's Health

## 2020-06-15 NOTE — Telephone Encounter (Signed)
Patient states she has been having episodes of nausea and vomiting nightly along with lower back pain. She is having lower abdominal cramping as well but is not timing them.  Denies bleeding.  Encouraged patient to start timing cramping and if they became more frequent, uncomfortable or she developed any bleeding, to call our office or go to [facility].  Long discussion with patient regarding true vs false labor.  Pt verbalized understanding and the end of our conversation.

## 2020-06-15 NOTE — Telephone Encounter (Signed)
Pt is 37 weeks / sick to her stomach and feels tights/ she feels bad/ back was hurting last night /had sharpe pains off and on

## 2020-06-20 ENCOUNTER — Encounter: Payer: Self-pay | Admitting: Women's Health

## 2020-06-20 ENCOUNTER — Encounter (HOSPITAL_COMMUNITY): Payer: Self-pay | Admitting: Family Medicine

## 2020-06-20 ENCOUNTER — Other Ambulatory Visit: Payer: Self-pay

## 2020-06-20 ENCOUNTER — Ambulatory Visit (INDEPENDENT_AMBULATORY_CARE_PROVIDER_SITE_OTHER): Payer: Medicaid Other | Admitting: Women's Health

## 2020-06-20 ENCOUNTER — Inpatient Hospital Stay (HOSPITAL_COMMUNITY)
Admission: AD | Admit: 2020-06-20 | Discharge: 2020-06-21 | Disposition: A | Discharge status: Home / Self Care | Payer: Medicaid Other | Attending: Family Medicine | Admitting: Family Medicine

## 2020-06-20 VITALS — BP 121/77 | HR 90 | Wt 145.5 lb

## 2020-06-20 DIAGNOSIS — R030 Elevated blood-pressure reading, without diagnosis of hypertension: Secondary | ICD-10-CM | POA: Diagnosis not present

## 2020-06-20 DIAGNOSIS — Z3480 Encounter for supervision of other normal pregnancy, unspecified trimester: Secondary | ICD-10-CM

## 2020-06-20 DIAGNOSIS — Z79899 Other long term (current) drug therapy: Secondary | ICD-10-CM | POA: Insufficient documentation

## 2020-06-20 DIAGNOSIS — O99891 Other specified diseases and conditions complicating pregnancy: Secondary | ICD-10-CM

## 2020-06-20 DIAGNOSIS — O09523 Supervision of elderly multigravida, third trimester: Secondary | ICD-10-CM | POA: Insufficient documentation

## 2020-06-20 DIAGNOSIS — F439 Reaction to severe stress, unspecified: Secondary | ICD-10-CM

## 2020-06-20 DIAGNOSIS — O26843 Uterine size-date discrepancy, third trimester: Secondary | ICD-10-CM

## 2020-06-20 DIAGNOSIS — O471 False labor at or after 37 completed weeks of gestation: Secondary | ICD-10-CM | POA: Insufficient documentation

## 2020-06-20 DIAGNOSIS — Z3483 Encounter for supervision of other normal pregnancy, third trimester: Secondary | ICD-10-CM

## 2020-06-20 DIAGNOSIS — O26893 Other specified pregnancy related conditions, third trimester: Secondary | ICD-10-CM | POA: Diagnosis present

## 2020-06-20 DIAGNOSIS — Z3A38 38 weeks gestation of pregnancy: Secondary | ICD-10-CM

## 2020-06-20 DIAGNOSIS — O163 Unspecified maternal hypertension, third trimester: Secondary | ICD-10-CM

## 2020-06-20 DIAGNOSIS — O99333 Smoking (tobacco) complicating pregnancy, third trimester: Secondary | ICD-10-CM | POA: Diagnosis not present

## 2020-06-20 DIAGNOSIS — Z1389 Encounter for screening for other disorder: Secondary | ICD-10-CM

## 2020-06-20 DIAGNOSIS — H539 Unspecified visual disturbance: Secondary | ICD-10-CM

## 2020-06-20 DIAGNOSIS — Z331 Pregnant state, incidental: Secondary | ICD-10-CM

## 2020-06-20 LAB — COMPREHENSIVE METABOLIC PANEL
ALT: 27 U/L (ref 0–44)
AST: 35 U/L (ref 15–41)
Albumin: 2.8 g/dL — ABNORMAL LOW (ref 3.5–5.0)
Alkaline Phosphatase: 155 U/L — ABNORMAL HIGH (ref 38–126)
Anion gap: 10 (ref 5–15)
BUN: 6 mg/dL (ref 6–20)
CO2: 23 mmol/L (ref 22–32)
Calcium: 8.8 mg/dL — ABNORMAL LOW (ref 8.9–10.3)
Chloride: 102 mmol/L (ref 98–111)
Creatinine, Ser: 0.57 mg/dL (ref 0.44–1.00)
GFR calc Af Amer: >60 mL/min (ref >60)
GFR calc non Af Amer: >60 mL/min (ref >60)
Glucose, Bld: 109 mg/dL — ABNORMAL HIGH (ref 70–99)
Potassium: 3.2 mmol/L — ABNORMAL LOW (ref 3.5–5.1)
Sodium: 135 mmol/L (ref 135–145)
Total Bilirubin: 1.1 mg/dL (ref 0.3–1.2)
Total Protein: 6.6 g/dL (ref 6.5–8.1)

## 2020-06-20 LAB — POCT URINALYSIS DIPSTICK OB
Blood, UA: NEGATIVE
Glucose, UA: NEGATIVE
Ketones, UA: NEGATIVE
Leukocytes, UA: NEGATIVE
Nitrite, UA: NEGATIVE
POC,PROTEIN,UA: NEGATIVE

## 2020-06-20 LAB — URINALYSIS, ROUTINE W REFLEX MICROSCOPIC
Glucose, UA: NEGATIVE mg/dL
Hgb urine dipstick: NEGATIVE
Ketones, ur: NEGATIVE mg/dL
Leukocytes,Ua: NEGATIVE
Nitrite: NEGATIVE
Protein, ur: 30 mg/dL — AB
Specific Gravity, Urine: 1.024 (ref 1.005–1.030)
pH: 6 (ref 5.0–8.0)

## 2020-06-20 LAB — PROTEIN / CREATININE RATIO, URINE
Creatinine, Urine: 238.49 mg/dL
Protein Creatinine Ratio: 0.06 mg/mg{Cre} (ref 0.00–0.15)
Total Protein, Urine: 14 mg/dL

## 2020-06-20 LAB — CBC
HCT: 36.6 % (ref 36.0–46.0)
Hemoglobin: 12.2 g/dL (ref 12.0–15.0)
MCH: 32.5 pg (ref 26.0–34.0)
MCHC: 33.3 g/dL (ref 30.0–36.0)
MCV: 97.6 fL (ref 80.0–100.0)
Platelets: 302 10*3/uL (ref 150–400)
RBC: 3.75 MIL/uL — ABNORMAL LOW (ref 3.87–5.11)
RDW: 13.2 % (ref 11.5–15.5)
WBC: 16.2 10*3/uL — ABNORMAL HIGH (ref 4.0–10.5)
nRBC: 0 % (ref 0.0–0.2)

## 2020-06-20 MED ORDER — ALUM & MAG HYDROXIDE-SIMETH 200-200-20 MG/5ML PO SUSP
30.0000 mL | Freq: Once | ORAL | Status: AC
  Administered 2020-06-20: 30 mL via ORAL
  Filled 2020-06-20: qty 30

## 2020-06-20 MED ORDER — FAMOTIDINE 20 MG PO TABS
20.0000 mg | ORAL_TABLET | Freq: Once | ORAL | Status: AC
  Administered 2020-06-20: 20 mg via ORAL
  Filled 2020-06-20: qty 1

## 2020-06-20 NOTE — MAU Note (Addendum)
Pt here with reports of contractions that started after her appointment today, about every 10 mins. She denies LOF or vaginal bleeding. Reports good fetal movement. Cervix 2cm today. Pt also reports HBP today as well. Checked her BP at home and was 144/111. Pt reports she stopped at [facility] and EMS was there, so she had them check BP and it was 177/99. No HAor RUQ pain, but has been seeing spots on and off today. Denies HBP during pregnancy. Also has heartburn, normally takes tums, but did not take any today.

## 2020-06-20 NOTE — MAU Provider Note (Signed)
Chief Complaint:  Contractions and Hypertension   First Provider Initiated Contact with Patient 06/20/20 2135     HPI: Rachel Orr is a 39 y.o. G5P4004 at 7w4dwho presents to maternity admissions reporting uterine contractions and elevated blood pressure at home.  . She reports good fetal movement, denies LOF, vaginal bleeding, vaginal itching/burning, urinary symptoms, h/a, dizziness, n/v, diarrhea, constipation or fever/chills.  She denies headache, visual changes or RUQ abdominal pain.  Hypertension This is a new problem. The current episode started today. The problem has been resolved since onset. Pertinent negatives include no anxiety, blurred vision, chest pain, headaches or peripheral edema. There are no associated agents to hypertension. There are no known risk factors for coronary artery disease. Past treatments include nothing. There are no compliance problems.     RN Note: Pt here with reports of contractions that started after her appointment today, about every 10 mins. She denies LOF or vaginal bleeding. Reports good fetal movement. Cervix 2cm today. Pt also reports HBP today as well. Checked her BP at home and was 144/111. Pt reports she stopped at [facility] and EMS was there, so she had them check BP and it was 177/99. No HAor RUQ pain, but has been seeing spots on and off today. Denies HBP during pregnancy. Also has heartburn, normally takes tums, but did not take any today.   Past Medical History: Past Medical History:  Diagnosis Date  . Anemia     Past obstetric history: OB History  Gravida Para Term Preterm AB Living  5 4 4     4   SAB TAB Ectopic Multiple Live Births          4    # Outcome Date GA Lbr Len/2nd Weight Sex Delivery Anes PTL Lv  5 Current           4 Term 09/30/11 [redacted]w[redacted]d  3629 g M Vag-Spont None N LIV  3 Term 02/07/05 [redacted]w[redacted]d  3629 g F Vag-Spont None N LIV  2 Term 02/16/03 [redacted]w[redacted]d  3175 g F Vag-Spont Other N LIV  1 Term 11/14/98 [redacted]w[redacted]d  3090 g F Vag-Spont  None N LIV    Past Surgical History: Past Surgical History:  Procedure Laterality Date  . NO PAST SURGERIES      Family History: Family History  Problem Relation Age of Onset  . Asthma Son   . Hypertension Son   . Hypertension Father   . COPD Father   . Hypertension Sister   . Hypertension Paternal Uncle   . Diabetes Paternal Grandmother   . Hypertension Paternal Grandmother   . Asthma Paternal Grandmother   . COPD Paternal Grandmother     Social History: Social History   Tobacco Use  . Smoking status: Current Every Day Smoker    Packs/day: 1.00    Types: Cigarettes  . Smokeless tobacco: Never Used  Vaping Use  . Vaping Use: Never used  Substance Use Topics  . Alcohol use: No  . Drug use: No    Allergies: No Known Allergies  Meds:  Medications Prior to Admission  Medication Sig Dispense Refill Last Dose  . calcium carbonate (TUMS - DOSED IN MG ELEMENTAL CALCIUM) 500 MG chewable tablet Chew 1 tablet by mouth daily.   06/19/2020 at Unknown time  . Prenatal Vit-Fe Fumarate-FA (PRENATAL COMPLETE) 14-0.4 MG TABS Take one tablet daily 30 tablet 11 06/20/2020 at Unknown time  . Acetaminophen (TYLENOL PO) Take by mouth as needed.     . Misc.  Devices MISC Dispense one maternity belt for patient (Patient not taking: Reported on 06/06/2020) 1 each 0   . omeprazole (PRILOSEC) 20 MG capsule Take 1 capsule (20 mg total) by mouth daily. (Patient not taking: Reported on 06/20/2020) 30 capsule 0     I have reviewed patient's Past Medical Hx, Surgical Hx, Family Hx, Social Hx, medications and allergies.   ROS:  Review of Systems  Eyes: Negative for blurred vision.  Cardiovascular: Negative for chest pain.  Neurological: Negative for headaches.   Other systems negative  Physical Exam   Patient Vitals for the past 24 hrs:  BP Temp Temp src Pulse Resp SpO2  06/20/20 2134 127/87 -- -- (!) 101 -- --  06/20/20 2119 132/62 98.9 F (37.2 C) Oral (!) 107 18 98 %   Vitals:    06/20/20 2230 06/20/20 2245 06/20/20 2300 06/21/20 0013  BP: 122/80 114/77 123/85   Pulse: 98 86 90   Resp:    18  Temp:      TempSrc:      SpO2:   99%     Constitutional: Well-developed, well-nourished female in no acute distress.  Cardiovascular: normal rate and rhythm Respiratory: normal effort, clear to auscultation bilaterally GI: Abd soft, non-tender, gravid appropriate for gestational age.   No rebound or guarding. MS: Extremities nontender, no edema, normal ROM Neurologic: Alert and oriented x 4.  GU: Neg CVAT.  PELVIC EXAM:  Dilation: 1.5 Effacement (%): Thick Station: Ballotable Presentation: Undeterminable Exam by:: Camelia Eng, RN   FHT:  Baseline 145 , moderate variability, accelerations present, no decelerations Contractions: q 3-6 mins Irregular     Labs: O/Positive/-- (02/24 1611) Results for orders placed or performed during the hospital encounter of 06/20/20 (from the past 24 hour(s))  Urinalysis, Routine w reflex microscopic Urine, Clean Catch     Status: Abnormal   Collection Time: 06/20/20  9:28 PM  Result Value Ref Range   Color, Urine AMBER (A) YELLOW   APPearance HAZY (A) CLEAR   Specific Gravity, Urine 1.024 1.005 - 1.030   pH 6.0 5.0 - 8.0   Glucose, UA NEGATIVE NEGATIVE mg/dL   Hgb urine dipstick NEGATIVE NEGATIVE   Bilirubin Urine SMALL (A) NEGATIVE   Ketones, ur NEGATIVE NEGATIVE mg/dL   Protein, ur 30 (A) NEGATIVE mg/dL   Nitrite NEGATIVE NEGATIVE   Leukocytes,Ua NEGATIVE NEGATIVE   RBC / HPF 0-5 0 - 5 RBC/hpf   WBC, UA 0-5 0 - 5 WBC/hpf   Bacteria, UA RARE (A) NONE SEEN   Squamous Epithelial / LPF 0-5 0 - 5   Mucus PRESENT    Ca Oxalate Crys, UA PRESENT   Protein / creatinine ratio, urine     Status: None   Collection Time: 06/20/20  9:36 PM  Result Value Ref Range   Creatinine, Urine 238.49 mg/dL   Total Protein, Urine 14 mg/dL   Protein Creatinine Ratio 0.06 0.00 - 0.15 mg/mg[Cre]  CBC     Status: Abnormal   Collection  Time: 06/20/20  9:52 PM  Result Value Ref Range   WBC 16.2 (H) 4.0 - 10.5 K/uL   RBC 3.75 (L) 3.87 - 5.11 MIL/uL   Hemoglobin 12.2 12.0 - 15.0 g/dL   HCT 52.7 36 - 46 %   MCV 97.6 80.0 - 100.0 fL   MCH 32.5 26.0 - 34.0 pg   MCHC 33.3 30.0 - 36.0 g/dL   RDW 78.2 42.3 - 53.6 %   Platelets 302 150 - 400 K/uL  nRBC 0.0 0.0 - 0.2 %  Comprehensive metabolic panel     Status: Abnormal   Collection Time: 06/20/20  9:52 PM  Result Value Ref Range   Sodium 135 135 - 145 mmol/L   Potassium 3.2 (L) 3.5 - 5.1 mmol/L   Chloride 102 98 - 111 mmol/L   CO2 23 22 - 32 mmol/L   Glucose, Bld 109 (H) 70 - 99 mg/dL   BUN 6 6 - 20 mg/dL   Creatinine, Ser 5.80 0.44 - 1.00 mg/dL   Calcium 8.8 (L) 8.9 - 10.3 mg/dL   Total Protein 6.6 6.5 - 8.1 g/dL   Albumin 2.8 (L) 3.5 - 5.0 g/dL   AST 35 15 - 41 U/L   ALT 27 0 - 44 U/L   Alkaline Phosphatase 155 (H) 38 - 126 U/L   Total Bilirubin 1.1 0.3 - 1.2 mg/dL   GFR calc non Af Amer >60 >60 mL/min   GFR calc Af Amer >60 >60 mL/min   Anion gap 10 5 - 15    Imaging:  No results found.  MAU Course/MDM: I have ordered labs and reviewed results.  These are normal  Blood pressures have been normal while she was here Cervix did not change over time  NST reviewed, reactive.  Treatments in MAU included EFM.    Assessment: Single IUP at [redacted]w[redacted]d Elevated BP at home, normal here Reported visual changes over past week Stress  Plan: Discharge home Labor precautions and fetal kick counts Preeclampsia precautions Stress reduction Follow up in Office for prenatal visits  Encouraged to return here or to other Urgent Care/ED if she develops worsening of symptoms, increase in pain, fever, or other concerning symptoms.   Pt stable at time of discharge.  Wynelle Bourgeois CNM, MSN Certified Nurse-Midwife 06/20/2020 9:35 PM

## 2020-06-20 NOTE — Patient Instructions (Signed)
[facility], I greatly value your feedback.  If you receive a survey following your visit with [place] today, we appreciate you taking the time to fill it out.  Thanks, Joellyn Haff, CNM, WHNP-BC  [facility] ([address] [place], [place] [address]) Entrance C, located off of [address] valet parking   Go to [email or web address] to register for FREE online childbirth classes    Call the office [phone]) or go to [facility] if:  You begin to have strong, frequent contractions  Your water breaks.  Sometimes it is a big gush of fluid, sometimes it is just a trickle that keeps getting your panties wet or running down your legs  You have vaginal bleeding.  It is normal to have a small amount of spotting if your cervix was checked.   You don't feel your baby moving like normal.  If you don't, get you something to eat and drink and lay down and focus on feeling your baby move.  You should feel at least 10 movements in 2 hours.  If you don't, you should call the office or go to [facility].   Call the office [phone]) or go to [facility] hospital for these signs of pre-eclampsia:  Severe headache that does not go away with Tylenol  Visual changes- seeing spots, double, blurred vision  Pain under your right breast or upper abdomen that does not go away with Tums or heartburn medicine  Nausea and/or vomiting  Severe swelling in your hands, feet, and face    Home Blood Pressure Monitoring for Patients   Your provider has recommended that you check your blood pressure (BP) at least once a week at home. If you do not have a blood pressure cuff at home, one will be provided for you. Contact your provider if you have not received your monitor within 1 week.   Helpful Tips for Accurate Home Blood Pressure Checks  . Don't smoke, exercise, or drink caffeine 30 minutes before checking your BP . Use the restroom before checking your BP (a full  bladder can raise your pressure) . Relax in a comfortable upright chair . Feet on the ground . Left arm resting comfortably on a flat surface at the level of your heart . Legs uncrossed . Back supported . Sit quietly and don't talk . Place the cuff on your bare arm . Adjust snuggly, so that only two fingertips can fit between your skin and the top of the cuff . Check 2 readings separated by at least one minute . Keep a log of your BP readings . For a visual, please reference this diagram: http://ccnc.care/bpdiagram  Provider Name: [facility]     Phone: [phone]  Zone 1: ALL CLEAR  Continue to monitor your symptoms:  . BP reading is less than 140 (top number) or less than 90 (bottom number)  . No right upper stomach pain . No headaches or seeing spots . No feeling nauseated or throwing up . No swelling in face and hands  Zone 2: CAUTION Call your doctor's office for any of the following:  . BP reading is greater than 140 (top number) or greater than 90 (bottom number)  . Stomach pain under your ribs in the middle or right side . Headaches or seeing spots . Feeling nauseated or throwing up . Swelling in face and hands  Zone 3: EMERGENCY  Seek immediate medical care if you have any of  the following:  . BP reading is greater than160 (top number) or greater than 110 (bottom number) . Severe headaches not improving with Tylenol . Serious difficulty catching your breath . Any worsening symptoms from Zone 2   Braxton Hicks Contractions Contractions of the uterus can occur throughout pregnancy, but they are not always a sign that you are in labor. You may have practice contractions called Braxton Hicks contractions. These false labor contractions are sometimes confused with true labor. What are Braxton Hicks contractions? Braxton Hicks contractions are tightening movements that occur in the muscles of the uterus before labor. Unlike true labor contractions, these  contractions do not result in opening (dilation) and thinning of the cervix. Toward the end of pregnancy (32-34 weeks), Braxton Hicks contractions can happen more often and may become stronger. These contractions are sometimes difficult to tell apart from true labor because they can be very uncomfortable. You should not feel embarrassed if you go to the hospital with false labor. Sometimes, the only way to tell if you are in true labor is for your health care provider to look for changes in the cervix. The health care provider will do a physical exam and may monitor your contractions. If you are not in true labor, the exam should show that your cervix is not dilating and your water has not broken. If there are no other health problems associated with your pregnancy, it is completely safe for you to be sent home with false labor. You may continue to have Braxton Hicks contractions until you go into true labor. How to tell the difference between true labor and false labor True labor  Contractions last 30-70 seconds.  Contractions become very regular.  Discomfort is usually felt in the top of the uterus, and it spreads to the lower abdomen and low back.  Contractions do not go away with walking.  Contractions usually become more intense and increase in frequency.  The cervix dilates and gets thinner. False labor  Contractions are usually shorter and not as strong as true labor contractions.  Contractions are usually irregular.  Contractions are often felt in the front of the lower abdomen and in the groin.  Contractions may go away when you walk around or change positions while lying down.  Contractions get weaker and are shorter-lasting as time goes on.  The cervix usually does not dilate or become thin. Follow these instructions at home:  1. Take over-the-counter and prescription medicines only as told by your health care provider. 2. Keep up with your usual exercises and follow other  instructions from your health care provider. 3. Eat and drink lightly if you think you are going into labor. 4. If Braxton Hicks contractions are making you uncomfortable: ? Change your position from lying down or resting to walking, or change from walking to resting. ? Sit and rest in a tub of warm water. ? Drink enough fluid to keep your urine pale yellow. Dehydration may cause these contractions. ? Do slow and deep breathing several times an hour. 5. Keep all follow-up prenatal visits as told by your health care provider. This is important. Contact a health care provider if:  You have a fever.  You have continuous pain in your abdomen. Get help right away if:  Your contractions become stronger, more regular, and closer together.  You have fluid leaking or gushing from your vagina.  You pass blood-tinged mucus (bloody show).  You have bleeding from your vagina.  You have low back   pain that you never had before.  You feel your baby's head pushing down and causing pelvic pressure.  Your baby is not moving inside you as much as it used to. Summary  Contractions that occur before labor are called Braxton Hicks contractions, false labor, or practice contractions.  Braxton Hicks contractions are usually shorter, weaker, farther apart, and less regular than true labor contractions. True labor contractions usually become progressively stronger and regular, and they become more frequent.  Manage discomfort from [facility] contractions by changing position, resting in a warm bath, drinking plenty of water, or practicing deep breathing. This information is not intended to replace advice given to you by your health care provider. Make sure you discuss any questions you have with your health care provider. Document Revised: 09/26/2017 Document Reviewed: 02/27/2017 Elsevier Patient Education  [place].

## 2020-06-20 NOTE — Progress Notes (Signed)
   LOW-RISK PREGNANCY VISIT Patient name: Rachel Orr MRN [id number]  Date of birth: April 23, 1981 Chief Complaint:   Routine Prenatal Visit  History of Present Illness:   Rachel Orr is a 39 y.o. [id number] female at [redacted]w[redacted]d with an Estimated Date of Delivery: 06/30/20 being seen today for ongoing management of a low-risk pregnancy.   Today she reports no complaints. Contractions: Irregular. Vag. Bleeding: None.  Movement: Present. denies leaking of fluid. Review of Systems:   Pertinent items are noted in HPI Denies abnormal vaginal discharge w/ itching/odor/irritation, headaches, visual changes, shortness of breath, chest pain, abdominal pain, severe nausea/vomiting, or problems with urination or bowel movements unless otherwise stated above. Pertinent History Reviewed:  Reviewed past medical,surgical, social, obstetrical and family history.  Reviewed problem list, medications and allergies. Physical Assessment:   Vitals:   06/20/20 1343  BP: 121/77  Pulse: 90  Weight: 145 lb 8 oz (66 kg)  Body mass index is 24.98 kg/m.        Physical Examination:   General appearance: Well appearing, and in no distress  Mental status: Alert, oriented to person, place, and time  Skin: Warm & dry  Cardiovascular: Normal heart rate noted  Respiratory: Normal respiratory effort, no distress  Abdomen: Soft, gravid, nontender  Pelvic: Cervical exam performed  Dilation: 1.5 Effacement (%): Thick Station: Ballotable  Extremities: Edema: None  Fetal Status: Fetal Heart Rate (bpm): 142 Fundal Height: 30 cm Movement: Present Presentation: Vertex  Chaperone: Peggy Dones    Results for orders placed or performed in visit on 06/20/20 (from the past 24 hour(s))  POC Urinalysis Dipstick OB   Collection Time: 06/20/20  1:44 PM  Result Value Ref Range   Color, UA     Clarity, UA     Glucose, UA Negative Negative   Bilirubin, UA     Ketones, UA neg    Spec Grav, UA     Blood, UA neg    pH, UA      POC,PROTEIN,UA Negative Negative, Trace, Small (1+), Moderate (2+), Large (3+), 4+   Urobilinogen, UA     Nitrite, UA neg    Leukocytes, UA Negative Negative   Appearance     Odor      Assessment & Plan:  1) Low-risk pregnancy G5P4004 at [redacted]w[redacted]d with an Estimated Date of Delivery: 06/30/20   2) Size <dates, will get efw/afi u/s asap   Meds: No orders of the defined types were placed in this encounter.  Labs/procedures today: sve  Plan:  Continue routine obstetrical care  Next visit: prefers in person    Reviewed: Term labor symptoms and general obstetric precautions including but not limited to vaginal bleeding, contractions, leaking of fluid and fetal movement were reviewed in detail with the patient.  All questions were answered.   Follow-up: Return for ASAP, US:EFW (no visit), then 1wk LROB w/ CNM in person.  Orders Placed This Encounter  Procedures  . US OB Follow Up  . POC Urinalysis Dipstick OB   Cheral Marker CNM, [facility] 06/20/2020 2:07 PM

## 2020-06-21 ENCOUNTER — Telehealth: Payer: Self-pay | Admitting: *Deleted

## 2020-06-21 ENCOUNTER — Telehealth: Payer: Self-pay | Admitting: Women's Health

## 2020-06-21 NOTE — Discharge Instructions (Signed)
Third Trimester of Pregnancy The third trimester is from week 28 through week 40 (months 7 through 9). The third trimester is a time when the unborn baby (fetus) is growing rapidly. At the end of the ninth month, the fetus is about 20 inches in length and weighs 6-10 pounds. Body changes during your third trimester Your body will continue to go through many changes during pregnancy. The changes vary from woman to woman. During the third trimester:  Your weight will continue to increase. You can expect to gain 25-35 pounds (11-16 kg) by the end of the pregnancy.  You may begin to get stretch marks on your hips, abdomen, and breasts.  You may urinate more often because the fetus is moving lower into your pelvis and pressing on your bladder.  You may develop or continue to have heartburn. This is caused by increased hormones that slow down muscles in the digestive tract.  You may develop or continue to have constipation because increased hormones slow digestion and cause the muscles that push waste through your intestines to relax.  You may develop hemorrhoids. These are swollen veins (varicose veins) in the rectum that can itch or be painful.  You may develop swollen, bulging veins (varicose veins) in your legs.  You may have increased body aches in the pelvis, back, or thighs. This is due to weight gain and increased hormones that are relaxing your joints.  You may have changes in your hair. These can include thickening of your hair, rapid growth, and changes in texture. Some women also have hair loss during or after pregnancy, or hair that feels dry or thin. Your hair will most likely return to normal after your baby is born.  Your breasts will continue to grow and they will continue to become tender. A yellow fluid (colostrum) may leak from your breasts. This is the first milk you are producing for your baby.  Your belly button may stick out.  You may notice more swelling in your hands,  face, or ankles.  You may have increased tingling or numbness in your hands, arms, and legs. The skin on your belly may also feel numb.  You may feel short of breath because of your expanding uterus.  You may have more problems sleeping. This can be caused by the size of your belly, increased need to urinate, and an increase in your body's metabolism.  You may notice the fetus "dropping," or moving lower in your abdomen (lightening).  You may have increased vaginal discharge.  You may notice your joints feel loose and you may have pain around your pelvic bone. What to expect at prenatal visits You will have prenatal exams every 2 weeks until week 36. Then you will have weekly prenatal exams. During a routine prenatal visit:  You will be weighed to make sure you and the baby are growing normally.  Your blood pressure will be taken.  Your abdomen will be measured to track your baby's growth.  The fetal heartbeat will be listened to.  Any test results from the previous visit will be discussed.  You may have a cervical check near your due date to see if your cervix has softened or thinned (effaced).  You will be tested for Group B streptococcus. This happens between 35 and 37 weeks. Your health care provider may ask you:  What your birth plan is.  How you are feeling.  If you are feeling the baby move.  If you have had any abnormal   symptoms, such as leaking fluid, bleeding, severe headaches, or abdominal cramping.  If you are using any tobacco products, including cigarettes, chewing tobacco, and electronic cigarettes.  If you have any questions. Other tests or screenings that may be performed during your third trimester include:  Blood tests that check for low iron levels (anemia).  Fetal testing to check the health, activity level, and growth of the fetus. Testing is done if you have certain medical conditions or if there are problems during the pregnancy.  Nonstress test  (NST). This test checks the health of your baby to make sure there are no signs of problems, such as the baby not getting enough oxygen. During this test, a belt is placed around your belly. The baby is made to move, and its heart rate is monitored during movement. What is false labor? False labor is a condition in which you feel small, irregular tightenings of the muscles in the womb (contractions) that usually go away with rest, changing position, or drinking water. These are called Braxton Hicks contractions. Contractions may last for hours, days, or even weeks before true labor sets in. If contractions come at regular intervals, become more frequent, increase in intensity, or become painful, you should see your health care provider. What are the signs of labor?  Abdominal cramps.  Regular contractions that start at 10 minutes apart and become stronger and more frequent with time.  Contractions that start on the top of the uterus and spread down to the lower abdomen and back.  Increased pelvic pressure and dull back pain.  A watery or bloody mucus discharge that comes from the vagina.  Leaking of amniotic fluid. This is also known as your "water breaking." It could be a slow trickle or a gush. Let your health care provider know if it has a color or strange odor. If you have any of these signs, call your health care provider right away, even if it is before your due date. Follow these instructions at home: Medicines  Follow your health care provider's instructions regarding medicine use. Specific medicines may be either safe or unsafe to take during pregnancy.  Take a prenatal vitamin that contains at least 600 micrograms (mcg) of folic acid.  If you develop constipation, try taking a stool softener if your health care provider approves. Eating and drinking   Eat a balanced diet that includes fresh fruits and vegetables, whole grains, good sources of protein such as meat, eggs, or tofu,  and low-fat dairy. Your health care provider will help you determine the amount of weight gain that is right for you.  Avoid raw meat and uncooked cheese. These carry germs that can cause birth defects in the baby.  If you have low calcium intake from food, talk to your health care provider about whether you should take a daily calcium supplement.  Eat four or five small meals rather than three large meals a day.  Limit foods that are high in fat and processed sugars, such as fried and sweet foods.  To prevent constipation: ? Drink enough fluid to keep your urine clear or pale yellow. ? Eat foods that are high in fiber, such as fresh fruits and vegetables, whole grains, and beans. Activity  Exercise only as directed by your health care provider. Most women can continue their usual exercise routine during pregnancy. Try to exercise for 30 minutes at least 5 days a week. Stop exercising if you experience uterine contractions.  Avoid heavy lifting.  Do   not exercise in extreme heat or humidity, or at high altitudes.  Wear low-heel, comfortable shoes.  Practice good posture.  You may continue to have sex unless your health care provider tells you otherwise. Relieving pain and discomfort  Take frequent breaks and rest with your legs elevated if you have leg cramps or low back pain.  Take warm sitz baths to soothe any pain or discomfort caused by hemorrhoids. Use hemorrhoid cream if your health care provider approves.  Wear a good support bra to prevent discomfort from breast tenderness.  If you develop varicose veins: ? Wear support pantyhose or compression stockings as told by your healthcare provider. ? Elevate your feet for 15 minutes, 3-4 times a day. Prenatal care  Write down your questions. Take them to your prenatal visits.  Keep all your prenatal visits as told by your health care provider. This is important. Safety  Wear your seat belt at all times when driving.  Make  a list of emergency phone numbers, including numbers for family, friends, the hospital, and police and fire departments. General instructions  Avoid cat litter boxes and soil used by cats. These carry germs that can cause birth defects in the baby. If you have a cat, ask someone to clean the litter box for you.  Do not travel far distances unless it is absolutely necessary and only with the approval of your health care provider.  Do not use hot tubs, steam rooms, or saunas.  Do not drink alcohol.  Do not use any products that contain nicotine or tobacco, such as cigarettes and e-cigarettes. If you need help quitting, ask your health care provider.  Do not use any medicinal herbs or unprescribed drugs. These chemicals affect the formation and growth of the baby.  Do not douche or use tampons or scented sanitary pads.  Do not cross your legs for long periods of time.  To prepare for the arrival of your baby: ? Take prenatal classes to understand, practice, and ask questions about labor and delivery. ? Make a trial run to the hospital. ? Visit the hospital and tour the maternity area. ? Arrange for maternity or paternity leave through employers. ? Arrange for family and friends to take care of pets while you are in the hospital. ? Purchase a rear-facing car seat and make sure you know how to install it in your car. ? Pack your hospital bag. ? Prepare the baby's nursery. Make sure to remove all pillows and stuffed animals from the baby's crib to prevent suffocation.  Visit your dentist if you have not gone during your pregnancy. Use a soft toothbrush to brush your teeth and be gentle when you floss. Contact a health care provider if:  You are unsure if you are in labor or if your water has broken.  You become dizzy.  You have mild pelvic cramps, pelvic pressure, or nagging pain in your abdominal area.  You have lower back pain.  You have persistent nausea, vomiting, or  diarrhea.  You have an unusual or bad smelling vaginal discharge.  You have pain when you urinate. Get help right away if:  Your water breaks before 37 weeks.  You have regular contractions less than 5 minutes apart before 37 weeks.  You have a fever.  You are leaking fluid from your vagina.  You have spotting or bleeding from your vagina.  You have severe abdominal pain or cramping.  You have rapid weight loss or weight gain.  You have   shortness of breath with chest pain.  You notice sudden or extreme swelling of your face, hands, ankles, feet, or legs.  Your baby makes fewer than 10 movements in 2 hours.  You have severe headaches that do not go away when you take medicine.  You have vision changes. Summary  The third trimester is from week 28 through week 40, months 7 through 9. The third trimester is a time when the unborn baby (fetus) is growing rapidly.  During the third trimester, your discomfort may increase as you and your baby continue to gain weight. You may have abdominal, leg, and back pain, sleeping problems, and an increased need to urinate.  During the third trimester your breasts will keep growing and they will continue to become tender. A yellow fluid (colostrum) may leak from your breasts. This is the first milk you are producing for your baby.  False labor is a condition in which you feel small, irregular tightenings of the muscles in the womb (contractions) that eventually go away. These are called Braxton Hicks contractions. Contractions may last for hours, days, or even weeks before true labor sets in.  Signs of labor can include: abdominal cramps; regular contractions that start at 10 minutes apart and become stronger and more frequent with time; watery or bloody mucus discharge that comes from the vagina; increased pelvic pressure and dull back pain; and leaking of amniotic fluid. This information is not intended to replace advice given to you by your  health care provider. Make sure you discuss any questions you have with your health care provider. Document Revised: 02/04/2019 Document Reviewed: 11/19/2016 Elsevier Patient Education  2020 [employer]  

## 2020-06-21 NOTE — Telephone Encounter (Signed)
Pt wants to talk about her being dilated to a 2. Wants to get stripped

## 2020-06-21 NOTE — Telephone Encounter (Signed)
Patient states she has not felt the baby move all day.  She has been up cleaning and doing things and normally feels her but has not today.  She is also continuing to have contractions..  Advised patient to sit or lay down for a least 30 minutes and to try to eat or drink something and see if she can feel any movement and if not, will need to go to MAU.  Also advised membranes cannot be stripped before 39 weeks in which they could do it on Monday at her next appointment. Pt verbalized understanding.

## 2020-06-21 NOTE — Telephone Encounter (Signed)
Went to [employer] yesterday. BP 144/111. Dilated to 2 cm. Has not felt baby movement today. + back pain; took Tylenol; that eased it up. Pt spoke to Tish. JSY

## 2020-06-22 ENCOUNTER — Ambulatory Visit (INDEPENDENT_AMBULATORY_CARE_PROVIDER_SITE_OTHER): Payer: Medicaid Other | Admitting: Advanced Practice Midwife

## 2020-06-22 ENCOUNTER — Telehealth: Payer: Self-pay | Admitting: Advanced Practice Midwife

## 2020-06-22 ENCOUNTER — Encounter: Payer: Self-pay | Admitting: Advanced Practice Midwife

## 2020-06-22 VITALS — BP 123/81 | HR 87 | Wt 147.0 lb

## 2020-06-22 DIAGNOSIS — Z1389 Encounter for screening for other disorder: Secondary | ICD-10-CM

## 2020-06-22 DIAGNOSIS — Z331 Pregnant state, incidental: Secondary | ICD-10-CM | POA: Diagnosis not present

## 2020-06-22 DIAGNOSIS — Z3A38 38 weeks gestation of pregnancy: Secondary | ICD-10-CM

## 2020-06-22 DIAGNOSIS — Z3480 Encounter for supervision of other normal pregnancy, unspecified trimester: Secondary | ICD-10-CM | POA: Diagnosis not present

## 2020-06-22 LAB — POCT URINALYSIS DIPSTICK OB
Blood, UA: NEGATIVE
Glucose, UA: NEGATIVE
Ketones, UA: NEGATIVE
Leukocytes, UA: NEGATIVE
Nitrite, UA: NEGATIVE
POC,PROTEIN,UA: NEGATIVE

## 2020-06-22 NOTE — Progress Notes (Signed)
WORK IN FOR LEAKING FLUID    LOW-RISK PREGNANCY VISIT Patient name: Rachel Orr MRN [id number]  Date of birth: 1981/02/13 Chief Complaint:   Routine Prenatal Visit (? leaking fluid)  History of Present Illness:   Rachel Orr is a 39 y.o. [id number] female at [redacted]w[redacted]d with an Estimated Date of Delivery: 06/30/20 being seen today for ongoing management of a low-risk pregnancy.  Today she reports "Leaking fluid when I cough or sneeze" . Contractions: Irregular. Vag. Bleeding: None.  Movement: Present.  Review of Systems:   Pertinent items are noted in HPI Denies abnormal vaginal discharge w/ itching/odor/irritation, headaches, visual changes, shortness of breath, chest pain, abdominal pain, severe nausea/vomiting, or problems with urination or bowel movements unless otherwise stated above. Pertinent History Reviewed:  Reviewed past medical,surgical, social, obstetrical and family history.  Reviewed problem list, medications and allergies. Physical Assessment:   Vitals:   06/22/20 1602  BP: 123/81  Pulse: 87  Weight: 147 lb (66.7 kg)  Body mass index is 25.23 kg/m.        Physical Examination:   General appearance: Well appearing, and in no distress  Mental status: Alert, oriented to person, place, and time  Skin: Warm & dry  Cardiovascular: Normal heart rate noted  Respiratory: Normal respiratory effort, no distress  Abdomen: Soft, gravid, nontender  Pelvic: Cervical exam performed  Dilation: 1.5 Effacement (%): Thick Station: Ballotable  Extremities: Edema: None  Fetal Status: Fetal Heart Rate (bpm): 140 Fundal Height: 31 cm Movement: Present Presentation: Vertex  Chaperone: Peggy Dones    No results found for this or any previous visit (from the past 24 hour(s)).  Assessment & Plan:  1) Low-risk pregnancy G5P4004 at [redacted]w[redacted]d with an Estimated Date of Delivery: 06/30/20   2) No evidence of ROM, sounds like urine leakage   Meds: No orders of the defined types were placed in this  encounter.  Labs/procedures today: fern  Plan:  Continue routine obstetrical care  Next visit: prefers will be in person for [place] to asses EFW/AFI d/t size < dates    Reviewed: Term labor symptoms and general obstetric precautions including but not limited to vaginal bleeding, contractions, leaking of fluid and fetal movement were reviewed in detail with the patient.  All questions were answered Follow-up: Return for As scheduled.  Orders Placed This Encounter  Procedures  . POC Urinalysis Dipstick OB   Jacklyn Shell DNP, CNM 06/22/2020 4:32 PM

## 2020-06-22 NOTE — Telephone Encounter (Signed)
Please call pt she wants to be seen because she lost some of her mucus plug. I told her I would have a nurse call her

## 2020-06-22 NOTE — Telephone Encounter (Signed)
Pt lost some of mucus plug today. Pt was having some contractions this am. + baby movement. Pt's panties get wet when she coughs or laughs. Spoke with Tish, Charity fundraiser. Advised pt to come in to be checked. Pt voiced understanding. JSY

## 2020-06-24 ENCOUNTER — Encounter (HOSPITAL_COMMUNITY): Payer: Self-pay | Admitting: Family Medicine

## 2020-06-24 ENCOUNTER — Inpatient Hospital Stay (HOSPITAL_COMMUNITY)
Admission: AD | Admit: 2020-06-24 | Discharge: 2020-06-26 | DRG: 807 | Disposition: A | Discharge status: Home / Self Care | Payer: Medicaid Other | Attending: Family Medicine | Admitting: Family Medicine

## 2020-06-24 ENCOUNTER — Other Ambulatory Visit: Payer: Self-pay

## 2020-06-24 DIAGNOSIS — Z3480 Encounter for supervision of other normal pregnancy, unspecified trimester: Secondary | ICD-10-CM

## 2020-06-24 DIAGNOSIS — O139 Gestational [pregnancy-induced] hypertension without significant proteinuria, unspecified trimester: Secondary | ICD-10-CM | POA: Diagnosis present

## 2020-06-24 DIAGNOSIS — O368130 Decreased fetal movements, third trimester, not applicable or unspecified: Principal | ICD-10-CM | POA: Diagnosis present

## 2020-06-24 DIAGNOSIS — O134 Gestational [pregnancy-induced] hypertension without significant proteinuria, complicating childbirth: Secondary | ICD-10-CM | POA: Diagnosis present

## 2020-06-24 DIAGNOSIS — O99334 Smoking (tobacco) complicating childbirth: Secondary | ICD-10-CM | POA: Diagnosis present

## 2020-06-24 DIAGNOSIS — F17210 Nicotine dependence, cigarettes, uncomplicated: Secondary | ICD-10-CM | POA: Diagnosis present

## 2020-06-24 DIAGNOSIS — Z20822 Contact with and (suspected) exposure to covid-19: Secondary | ICD-10-CM | POA: Diagnosis present

## 2020-06-24 DIAGNOSIS — O99824 Streptococcus B carrier state complicating childbirth: Secondary | ICD-10-CM | POA: Diagnosis present

## 2020-06-24 DIAGNOSIS — Z3A39 39 weeks gestation of pregnancy: Secondary | ICD-10-CM

## 2020-06-24 DIAGNOSIS — O2302 Infections of kidney in pregnancy, second trimester: Secondary | ICD-10-CM | POA: Diagnosis present

## 2020-06-24 DIAGNOSIS — F17200 Nicotine dependence, unspecified, uncomplicated: Secondary | ICD-10-CM | POA: Diagnosis present

## 2020-06-24 LAB — TYPE AND SCREEN
ABO/RH(D): O POS
Antibody Screen: NEGATIVE

## 2020-06-24 LAB — COMPREHENSIVE METABOLIC PANEL
ALT: 32 U/L (ref 0–44)
AST: 33 U/L (ref 15–41)
Albumin: 2.8 g/dL — ABNORMAL LOW (ref 3.5–5.0)
Alkaline Phosphatase: 190 U/L — ABNORMAL HIGH (ref 38–126)
Anion gap: 11 (ref 5–15)
BUN: 5 mg/dL — ABNORMAL LOW (ref 6–20)
CO2: 22 mmol/L (ref 22–32)
Calcium: 8.9 mg/dL (ref 8.9–10.3)
Chloride: 101 mmol/L (ref 98–111)
Creatinine, Ser: 0.48 mg/dL (ref 0.44–1.00)
GFR calc Af Amer: >60 mL/min (ref >60)
GFR calc non Af Amer: >60 mL/min (ref >60)
Glucose, Bld: 82 mg/dL (ref 70–99)
Potassium: 3.2 mmol/L — ABNORMAL LOW (ref 3.5–5.1)
Sodium: 134 mmol/L — ABNORMAL LOW (ref 135–145)
Total Bilirubin: 1 mg/dL (ref 0.3–1.2)
Total Protein: 7 g/dL (ref 6.5–8.1)

## 2020-06-24 LAB — PROTEIN / CREATININE RATIO, URINE
Creatinine, Urine: 245.79 mg/dL
Protein Creatinine Ratio: 0.06 mg/mg{Cre} (ref 0.00–0.15)
Total Protein, Urine: 15 mg/dL

## 2020-06-24 LAB — CBC
HCT: 37.4 % (ref 36.0–46.0)
Hemoglobin: 12.4 g/dL (ref 12.0–15.0)
MCH: 32.9 pg (ref 26.0–34.0)
MCHC: 33.2 g/dL (ref 30.0–36.0)
MCV: 99.2 fL (ref 80.0–100.0)
Platelets: 314 10*3/uL (ref 150–400)
RBC: 3.77 MIL/uL — ABNORMAL LOW (ref 3.87–5.11)
RDW: 13.4 % (ref 11.5–15.5)
WBC: 16.5 10*3/uL — ABNORMAL HIGH (ref 4.0–10.5)
nRBC: 0 % (ref 0.0–0.2)

## 2020-06-24 LAB — SARS CORONAVIRUS 2 BY RT PCR (HOSPITAL ORDER, PERFORMED IN ~~LOC~~ HOSPITAL LAB): SARS Coronavirus 2: NEGATIVE

## 2020-06-24 MED ORDER — LACTATED RINGERS IV SOLN: INTRAVENOUS | Status: DC

## 2020-06-24 MED ORDER — ONDANSETRON HCL 4 MG/2ML IJ SOLN: 4.0000 mg | Freq: Four times a day (QID) | INTRAMUSCULAR | Status: DC | PRN

## 2020-06-24 MED ORDER — SOD CITRATE-CITRIC ACID 500-334 MG/5ML PO SOLN: 30.0000 mL | ORAL | Status: DC | PRN

## 2020-06-24 MED ORDER — OXYTOCIN BOLUS FROM INFUSION
333.0000 mL | Freq: Once | INTRAVENOUS | Status: AC
  Administered 2020-06-25: 333 mL via INTRAVENOUS

## 2020-06-24 MED ORDER — SODIUM CHLORIDE 0.9 % IV SOLN
5.0000 10*6.[IU] | Freq: Once | INTRAVENOUS | Status: AC
  Administered 2020-06-24: 5 10*6.[IU] via INTRAVENOUS
  Filled 2020-06-24: qty 5

## 2020-06-24 MED ORDER — OXYTOCIN-SODIUM CHLORIDE 30-0.9 UT/500ML-% IV SOLN
2.5000 [IU]/h | INTRAVENOUS | Status: DC
  Filled 2020-06-24: qty 500

## 2020-06-24 MED ORDER — MISOPROSTOL 50MCG HALF TABLET
ORAL_TABLET | ORAL | Status: AC
  Administered 2020-06-24: 50 ug via ORAL
  Filled 2020-06-24: qty 1

## 2020-06-24 MED ORDER — OXYCODONE-ACETAMINOPHEN 5-325 MG PO TABS: 2.0000 | ORAL_TABLET | ORAL | Status: DC | PRN

## 2020-06-24 MED ORDER — PENICILLIN G POT IN DEXTROSE 60000 UNIT/ML IV SOLN
3.0000 10*6.[IU] | INTRAVENOUS | Status: DC
  Administered 2020-06-24: 3 10*6.[IU] via INTRAVENOUS
  Filled 2020-06-24: qty 50

## 2020-06-24 MED ORDER — LIDOCAINE HCL (PF) 1 % IJ SOLN: 30.0000 mL | INTRAMUSCULAR | Status: DC | PRN

## 2020-06-24 MED ORDER — LACTATED RINGERS IV SOLN: 500.0000 mL | INTRAVENOUS | Status: DC | PRN

## 2020-06-24 MED ORDER — TERBUTALINE SULFATE 1 MG/ML IJ SOLN: 0.2500 mg | Freq: Once | INTRAMUSCULAR | Status: DC | PRN

## 2020-06-24 MED ORDER — ACETAMINOPHEN 325 MG PO TABS: 650.0000 mg | ORAL_TABLET | ORAL | Status: DC | PRN

## 2020-06-24 MED ORDER — MISOPROSTOL 50MCG HALF TABLET
50.0000 ug | ORAL_TABLET | ORAL | Status: DC | PRN
  Administered 2020-06-24: 50 ug via ORAL
  Filled 2020-06-24: qty 1

## 2020-06-24 MED ORDER — OXYCODONE-ACETAMINOPHEN 5-325 MG PO TABS: 1.0000 | ORAL_TABLET | ORAL | Status: DC | PRN

## 2020-06-24 NOTE — MAU Note (Signed)
Rachel Orr is a 39 y.o. at [redacted]w[redacted]d here in MAU reporting: lost mucus plug 2 days ago, since then has been having intermittent cramping. No bleeding, no LOF. DFM for the past 2-3 days.  Onset of complaint: ongoing  Pain score: 8/10  Vitals:   06/24/20 1632  BP: (!) 126/93  Pulse: (!) 104  Resp: 18  Temp: 97.8 F (36.6 C)  SpO2: 99%     FHT: 147  Lab orders placed from triage: none

## 2020-06-24 NOTE — Progress Notes (Signed)
Labor Progress Note Rachel Orr is a 39 y.o. [id number] at [redacted]w[redacted]d presented for IOL for DFM. S: Doing well without complaints.  O:  BP 104/68   Pulse 70   Temp 98.3 F (36.8 C) (Oral)   Resp 16   Ht 5\' 4"  (1.626 m)   Wt 65.5 kg   LMP 10/29/2019 (Approximate) Comment: has irreg. periods, + 2 home preg. tests  SpO2 99% Comment: room air  BMI 24.80 kg/m  EFM: baseline 140bpm/mod variability/+ accels/no decels Toco: q1-5 minutes  CVE: Dilation: 1.5 (foley bulb in place) Effacement (%): Thick Cervical Position: Posterior Station: -3 Presentation: Vertex Exam by:: Dr [email or web address]   [name]: 39 y.o. 20 [redacted]w[redacted]d presenting for IOL for DFM. #Labor: Progressing well. S/p cytotec x1. FB in place. Cervical rim still thick around FB tightly in place. Will repeat cytotec at this time and continue to augment as necessary. #Pain: PRN #FWB: cat 1 #GBS positive, PCN   [redacted]w[redacted]d, MD 10:45 PM

## 2020-06-24 NOTE — H&P (Addendum)
OBSTETRIC ADMISSION HISTORY AND PHYSICAL  Rachel Orr is a 39 y.o. female [phone] with IUP at [redacted]w[redacted]d by LMP presenting for IOL for decreased fetal movement. She reports +FMs, No LOF, no VB, no blurry vision, headaches or peripheral edema, and RUQ pain.  She plans on bottle feeding. She request depo and eventually BTL for birth control. She received her prenatal [facility] at [facility]   Dating: By LMP --->  Estimated Date of Delivery: 06/30/20  Sono:  @[redacted]w[redacted]d , CWD, normal anatomy, breech presentation,  905g, 21% EFW   Prenatal History/Complications: Pyelonephritis in first trimester Multigravida Nicotine use in pregnancy   Past Medical History: Past Medical History:  Diagnosis Date   Anemia     Past Surgical History: Past Surgical History:  Procedure Laterality Date   NO PAST SURGERIES      Obstetrical History: OB History     Gravida  5   Para  4   Term  4   Preterm      AB      Living  4      SAB      TAB      Ectopic      Multiple      Live Births  4           Social History: Social History   Socioeconomic History   Marital status: Single    Spouse name: Not on file   Number of children: Not on file   Years of education: Not on file   Highest education level: Not on file  Occupational History   Not on file  Tobacco Use   Smoking status: Current Every Day Smoker    Packs/day: 1.00    Types: Cigarettes   Smokeless tobacco: Never Used  Vaping Use   Vaping Use: Never used  Substance and Sexual Activity   Alcohol use: No   Drug use: No   Sexual activity: Yes    Birth control/protection: None  Other Topics Concern   Not on file  Social History Narrative   Not on file   Social Determinants of Health   Financial Resource Strain: Low Risk    Difficulty of Paying Living Expenses: Not hard at all  Food Insecurity: No Food Insecurity   Worried About in the Last Year: Never true   Ran Out of Food in the Last Year:  Never true  Transportation Needs: No Transportation Needs   Lack of Transportation (Medical): No   Lack of Transportation (Non-Medical): No  Physical Activity: Sufficiently Active   Days of Exercise per Week: 7 days   Minutes of Exercise per Session: 100 min  Stress: No Stress Concern Present   Feeling of Stress : Not at all  Social Connections: Socially Isolated   Frequency of Communication with Friends and Family: More than three times a week   Frequency of Social Gatherings with Friends and Family: More than three times a week   Attends Religious Services: Never   Programme researcher, broadcasting/film/video or Organizations: No   Attends Database administrator: Never   Marital Status: Never married    Family History: Family History  Problem Relation Age of Onset   Asthma Son    Hypertension Son    Hypertension Father    COPD Father    Hypertension Sister    Hypertension Paternal Uncle    Diabetes Paternal Grandmother    Hypertension Paternal Grandmother    Asthma Paternal Grandmother  COPD Paternal Grandmother     Allergies: No Known Allergies  Medications Prior to Admission  Medication Sig Dispense Refill Last Dose   Acetaminophen (TYLENOL PO) Take by mouth as needed.      calcium carbonate (TUMS - DOSED IN MG ELEMENTAL CALCIUM) 500 MG chewable tablet Chew 1 tablet by mouth daily.      Misc. Devices MISC Dispense one maternity belt for patient (Patient not taking: Reported on 06/06/2020) 1 each 0    omeprazole (PRILOSEC) 20 MG capsule Take 1 capsule (20 mg total) by mouth daily. (Patient not taking: Reported on 06/20/2020) 30 capsule 0    Prenatal Vit-Fe Fumarate-FA (PRENATAL COMPLETE) 14-0.4 MG TABS Take one tablet daily 30 tablet 11      Review of Systems   All systems reviewed and negative except as stated in HPI  Blood pressure (!) 126/93, pulse (!) 104, temperature 97.8 F (36.6 C), temperature source Oral, resp. rate 18, height 5\' 4"  (1.626 m), weight 65.5 kg, last  menstrual period 10/29/2019, SpO2 99 %, unknown if currently breastfeeding. General appearance: alert, cooperative and appears stated age Lungs: breathing comfortably on room air Abdomen: soft, non-tender; bowel sounds normal Pelvic: adequate 1/20/-3 Extremities: no sign of DVT Presentation: cephalic Fetal monitoringBaseline: 140 bpm, Variability: Good {> 6 bpm), Accelerations: Reactive and Decelerations: Early Uterine activity infrequent Dilation: 1.5 Effacement (%): Thick Station: -3 Exam by:: [email or web address] RN   Prenatal labs: ABO, Rh: O/Positive/-- (02/24 1611) Antibody: Negative (06/02 0900)  Rubella: 2.03 (02/24 1611) RPR: Non Reactive (06/02 0900)  HBsAg: Negative (02/24 1611)  HIV: Non Reactive (06/02 0900)  GBS: Positive/-- (08/10 1200)  1 hr Glucola normal Genetic screening  declined Anatomy 05-31-1983 normal  Prenatal Transfer Tool  Maternal Diabetes: No Genetic Screening: Declined Maternal Ultrasounds/Referrals: Normal Fetal Ultrasounds or other Referrals:  None Maternal Substance Abuse:  No Significant Maternal Medications:  None Significant Maternal Lab Results: Group B Strep positive  No results found for this or any previous visit (from the past 24 hour(s)).  Patient Active Problem List   Diagnosis Date Noted   Gestational hypertension 06/24/2020   Pyelonephritis affecting pregnancy in second trimester 02/02/2020   Encounter for supervision of other normal pregnancy, unspecified trimester 12/22/2019   Smoker 12/22/2019    Assessment/Plan:  Rachel Orr is a 39 y.o. G5P4004 at [redacted]w[redacted]d here for IOL for decreased fetal movement.  #Labor: Will begin induction with buccal cytotec and FB. #elevated BP: Diastolic pressures in the 90s for 2 measurements not 4 hours apart.  Not technically gHTN.  Will get HELLP labs.    #Pain: IV PRN #FWB: Cat I #ID:  GBS positive PCN #MOF: Bottle #MOC:Depo #Circ:  N/A  [redacted]w[redacted]d, MD  06/24/2020, 6:39 PM  Attestation:  I  confirm that I have verified the information documented in the resident's note and that I have also personally reperformed the physical exam and all medical decision making activities.  The patient was seen and examined by me also Agree with note NST reactive and reassuring UCs as listed Cervical exams as listed in note 06/26/2020, CNM

## 2020-06-24 NOTE — MAU Provider Note (Signed)
History     CSN: [id number]  Arrival date and time: 06/24/20 1617  Chief Complaint  Patient presents with  . Abdominal Pain   HPI BRIE Rachel Orr is a 39 y.o. G5P4004 at [redacted]w[redacted]d who presents with decreased fetal movement. She states the baby hasn't been moving as much for the last several days and she hasn't felt the baby move today. She denies any leaking or bleeding. Reports intermittent abdominal pain.   OB History    Gravida  5   Para  4   Term  4   Preterm      AB      Living  4     SAB      TAB      Ectopic      Multiple      Live Births  4           Past Medical History:  Diagnosis Date  . Anemia     Past Surgical History:  Procedure Laterality Date  . NO PAST SURGERIES      Family History  Problem Relation Age of Onset  . Asthma Son   . Hypertension Son   . Hypertension Father   . COPD Father   . Hypertension Sister   . Hypertension Paternal Uncle   . Diabetes Paternal Grandmother   . Hypertension Paternal Grandmother   . Asthma Paternal Grandmother   . COPD Paternal Grandmother     Social History   Tobacco Use  . Smoking status: Current Every Day Smoker    Packs/day: 1.00    Types: Cigarettes  . Smokeless tobacco: Never Used  Vaping Use  . Vaping Use: Never used  Substance Use Topics  . Alcohol use: No  . Drug use: No    Allergies: No Known Allergies  Medications Prior to Admission  Medication Sig Dispense Refill Last Dose  . Acetaminophen (TYLENOL PO) Take by mouth as needed.     . calcium carbonate (TUMS - DOSED IN MG ELEMENTAL CALCIUM) 500 MG chewable tablet Chew 1 tablet by mouth daily.     . Misc. Devices MISC Dispense one maternity belt for patient (Patient not taking: Reported on 06/06/2020) 1 each 0   . omeprazole (PRILOSEC) 20 MG capsule Take 1 capsule (20 mg total) by mouth daily. (Patient not taking: Reported on 06/20/2020) 30 capsule 0   . Prenatal Vit-Fe Fumarate-FA (PRENATAL COMPLETE) 14-0.4 MG TABS Take one  tablet daily 30 tablet 11     Review of Systems  Constitutional: Negative.  Negative for fatigue and fever.  HENT: Negative.   Respiratory: Negative.  Negative for shortness of breath.   Cardiovascular: Negative.  Negative for chest pain.  Gastrointestinal: Positive for abdominal pain. Negative for constipation, diarrhea, nausea and vomiting.  Genitourinary: Negative.  Negative for dysuria, vaginal bleeding and vaginal discharge.  Neurological: Negative.  Negative for dizziness and headaches.   Physical Exam   Blood pressure (!) 126/93, pulse (!) 104, temperature 97.8 F (36.6 C), temperature source Oral, resp. rate 18, height 5\' 4"  (1.626 m), weight 65.5 kg, last menstrual period 10/29/2019, SpO2 99 %, unknown if currently breastfeeding.  Physical Exam Vitals and nursing note reviewed.  Constitutional:      General: She is not in acute distress.    Appearance: She is well-developed.  HENT:     Head: Normocephalic.  Eyes:     Pupils: Pupils are equal, round, and reactive to light.  Cardiovascular:     Rate and  Rhythm: Normal rate and regular rhythm.     Heart sounds: Normal heart sounds.  Pulmonary:     Effort: Pulmonary effort is normal. No respiratory distress.     Breath sounds: Normal breath sounds.  Abdominal:     General: Bowel sounds are normal. There is no distension.     Palpations: Abdomen is soft.     Tenderness: There is no abdominal tenderness.  Skin:    General: Skin is warm and dry.  Neurological:     Mental Status: She is alert and oriented to person, place, and time.  Psychiatric:        Behavior: Behavior normal.        Thought Content: Thought content normal.        Judgment: Judgment normal.    Fetal Tracing:  Baseline: 130 Variability: moderate Accels: 15x15 Decels: none  Toco: ui   MAU Course  Procedures  MDM NST reactive but patient still reporting less movement than normal New onset HTN at term- denies HA, visual changes or  epigastric pain  Discussed with patient admission for IOL for decreased fetal movement and new onset HTN. Patient verbalized understanding and agreeablet o plan of care  Assessment and Plan  Decreased fetal movement at term  -Admit to labor and delivery -Report called to labor team  Rolm Bookbinder 06/24/2020, 4:46 PM

## 2020-06-25 ENCOUNTER — Encounter (HOSPITAL_COMMUNITY): Payer: Self-pay | Admitting: Family Medicine

## 2020-06-25 DIAGNOSIS — Z3A39 39 weeks gestation of pregnancy: Secondary | ICD-10-CM

## 2020-06-25 DIAGNOSIS — O368130 Decreased fetal movements, third trimester, not applicable or unspecified: Secondary | ICD-10-CM

## 2020-06-25 LAB — RPR: RPR Ser Ql: NONREACTIVE

## 2020-06-25 MED ORDER — IBUPROFEN 600 MG PO TABS
600.0000 mg | ORAL_TABLET | Freq: Four times a day (QID) | ORAL | Status: DC
  Administered 2020-06-25 – 2020-06-26 (×5): 600 mg via ORAL
  Filled 2020-06-25 (×5): qty 1

## 2020-06-25 MED ORDER — DIBUCAINE (PERIANAL) 1 % EX OINT: 1.0000 "application " | TOPICAL_OINTMENT | CUTANEOUS | Status: DC | PRN

## 2020-06-25 MED ORDER — FENTANYL CITRATE (PF) 100 MCG/2ML IJ SOLN
100.0000 ug | INTRAMUSCULAR | Status: DC | PRN
  Administered 2020-06-25: 100 ug via INTRAVENOUS
  Filled 2020-06-25: qty 2

## 2020-06-25 MED ORDER — COCONUT OIL OIL: 1.0000 "application " | TOPICAL_OIL | Status: DC | PRN

## 2020-06-25 MED ORDER — ONDANSETRON HCL 4 MG/2ML IJ SOLN: 4.0000 mg | INTRAMUSCULAR | Status: DC | PRN

## 2020-06-25 MED ORDER — SENNOSIDES-DOCUSATE SODIUM 8.6-50 MG PO TABS
2.0000 | ORAL_TABLET | ORAL | Status: DC
  Administered 2020-06-25: 2 via ORAL
  Filled 2020-06-25: qty 2

## 2020-06-25 MED ORDER — SIMETHICONE 80 MG PO CHEW: 80.0000 mg | CHEWABLE_TABLET | ORAL | Status: DC | PRN

## 2020-06-25 MED ORDER — WITCH HAZEL-GLYCERIN EX PADS: 1.0000 "application " | MEDICATED_PAD | CUTANEOUS | Status: DC | PRN

## 2020-06-25 MED ORDER — DIPHENHYDRAMINE HCL 25 MG PO CAPS: 25.0000 mg | ORAL_CAPSULE | Freq: Four times a day (QID) | ORAL | Status: DC | PRN

## 2020-06-25 MED ORDER — PRENATAL MULTIVITAMIN CH
1.0000 | ORAL_TABLET | Freq: Every day | ORAL | Status: DC
  Administered 2020-06-25: 1 via ORAL
  Filled 2020-06-25: qty 1

## 2020-06-25 MED ORDER — BENZOCAINE-MENTHOL 20-0.5 % EX AERO: 1.0000 "application " | INHALATION_SPRAY | CUTANEOUS | Status: DC | PRN

## 2020-06-25 MED ORDER — OXYCODONE HCL 5 MG PO TABS
5.0000 mg | ORAL_TABLET | ORAL | Status: DC | PRN
  Administered 2020-06-25: 5 mg via ORAL
  Filled 2020-06-25: qty 1

## 2020-06-25 MED ORDER — ONDANSETRON HCL 4 MG PO TABS: 4.0000 mg | ORAL_TABLET | ORAL | Status: DC | PRN

## 2020-06-25 MED ORDER — TETANUS-DIPHTH-ACELL PERTUSSIS 5-2.5-18.5 LF-MCG/0.5 IM SUSP: 0.5000 mL | Freq: Once | INTRAMUSCULAR | Status: DC

## 2020-06-25 MED ORDER — ACETAMINOPHEN 325 MG PO TABS: 650.0000 mg | ORAL_TABLET | ORAL | Status: DC | PRN

## 2020-06-25 MED ORDER — MEDROXYPROGESTERONE ACETATE 150 MG/ML IM SUSP
150.0000 mg | Freq: Once | INTRAMUSCULAR | Status: AC
  Administered 2020-06-26: 150 mg via INTRAMUSCULAR
  Filled 2020-06-25: qty 1

## 2020-06-25 NOTE — Discharge Instructions (Signed)

## 2020-06-25 NOTE — Progress Notes (Signed)
Labor Progress Note Rachel Orr is a 39 y.o. U1T1438 at [redacted]w[redacted]d presented for IOL for DFM. S: Doing well, feeling increased pressure. Feeling contractions every minute.  O:  BP 132/82   Pulse 76   Temp 98.3 F (36.8 C) (Oral)   Resp 16   Ht 5\' 4"  (1.626 m)   Wt 65.5 kg   LMP 10/29/2019 (Approximate) Comment: has irreg. periods, + 2 home preg. tests  SpO2 99% Comment: room air  BMI 24.80 kg/m  EFM: baseline 150bpm/mod variability/+ accels/no decels Toco: q1 minutes  CVE: Dilation: 6.5 Effacement (%): 80 Cervical Position: Posterior Station: -2 Presentation: Vertex Exam by:: [name]   [name]: 39 y.o. 20 [redacted]w[redacted]d presenting for IOL for DFM. #Labor: Progressing well. S/p cytotec x1. FB just dislodged, cervical exam as noted above. AROM with clear fluid @0117 . Will continue with expectant management at this time given cervical change and contraction pattern. #Pain: PRN #FWB: cat 1 #GBS positive, PCN   [redacted]w[redacted]d, MD 1:27 AM

## 2020-06-25 NOTE — Discharge Summary (Signed)
   Postpartum Discharge Summary  Date of Service updated 06/26/20     Patient Name: Rachel Orr DOB: 11/25/1980 MRN: [id number]  Date of admission: 06/24/2020 Delivery date:06/25/2020  Delivering provider: FIRESTONE, ALICIA C  Date of discharge: 06/26/2020  Admitting diagnosis: Gestational hypertension [O13.9] Intrauterine pregnancy: [redacted]w[redacted]d     Secondary diagnosis:  Active Problems:   Encounter for supervision of other normal pregnancy, unspecified trimester   Smoker   Pyelonephritis affecting pregnancy in second trimester   Gestational hypertension   Vaginal delivery  Additional problems: none    Discharge diagnosis: Term Pregnancy Delivered and Gestational Hypertension                                              Post partum procedures:none Augmentation: AROM, Cytotec and IP Foley Complications: None  Hospital course: Induction of Labor With Vaginal Delivery   39 y.o. yo G5P4004 at [redacted]w[redacted]d was admitted to the hospital 06/24/2020 for induction of labor.  Indication for induction: DFM, IOL with FB and cytotec. Patient then entered regular contraction pattern and progressed to complete without further augmentation. Patient had an uncomplicated labor course as follows: Membrane Rupture Time/Date: 1:16 AM ,06/25/2020   Delivery Method:Vaginal, Spontaneous  Episiotomy: None  Lacerations:  Labial  Details of delivery can be found in separate delivery note.  Patient had a routine postpartum course. Patient is discharged home 06/26/20.  Newborn Data: Birth date:06/25/2020  Birth time:2:02 AM  Gender:Female  Living status:Living  Apgars:7 ,9  Weight:2801 g   Magnesium Sulfate received: No BMZ received: No Rhophylac:N/A MMR:N/A T-DaP:Given prenatally Flu: N/A Transfusion:No  Physical exam  Vitals:   06/25/20 0433 06/25/20 0900 06/25/20 1200 06/25/20 2007  BP: 115/73 101/67 112/72 134/86  Pulse: 71 67  83  Resp: 18 18 19 18  Temp: 98 F (36.7 C) 98.5 F (36.9 C) 98.4 F  (36.9 C) 98.3 F (36.8 C)  TempSrc: Oral Oral Oral Oral  SpO2: 100%   100%  Weight:      Height:       General: alert, cooperative and no distress Lochia: appropriate Uterine Fundus: firm Incision: Healing well with no significant drainage DVT Evaluation: No evidence of DVT seen on physical exam. Labs: Lab Results  Component Value Date   WBC 16.5 (H) 06/24/2020   HGB 12.4 06/24/2020   HCT 37.4 06/24/2020   MCV 99.2 06/24/2020   PLT 314 06/24/2020   CMP Latest Ref Rng & Units 06/24/2020  Glucose 70 - 99 mg/dL 82  BUN 6 - 20 mg/dL 5(L)  Creatinine 0.44 - 1.00 mg/dL 0.48  Sodium 135 - 145 mmol/L 134(L)  Potassium 3.5 - 5.1 mmol/L 3.2(L)  Chloride 98 - 111 mmol/L 101  CO2 22 - 32 mmol/L 22  Calcium 8.9 - 10.3 mg/dL 8.9  Total Protein 6.5 - 8.1 g/dL 7.0  Total Bilirubin 0.3 - 1.2 mg/dL 1.0  Alkaline Phos 38 - 126 U/L 190(H)  AST 15 - 41 U/L 33  ALT 0 - 44 U/L 32   Edinburgh Score: No flowsheet data found.   After visit meds:  Allergies as of 06/26/2020   No Known Allergies     Medication List    STOP taking these medications   calcium carbonate 500 MG chewable tablet Commonly known as: TUMS - dosed in mg elemental calcium   Misc. Devices Misc     omeprazole 20 MG capsule Commonly known as: PRILOSEC   Prenatal Complete 14-0.4 MG Tabs     TAKE these medications   acetaminophen 500 MG tablet Commonly known as: TYLENOL Take 2 tablets (1,000 mg total) by mouth every 6 (six) hours as needed for mild pain or moderate pain (for pain scale < 4). What changed:   medication strength  how much to take  when to take this  reasons to take this   ibuprofen 600 MG tablet Commonly known as: ADVIL Take 1 tablet (600 mg total) by mouth every 6 (six) hours.        Discharge home in stable condition Infant Feeding: Bottle Infant Disposition:home with mother Discharge instruction: per After Visit Summary and Postpartum booklet. Activity: Advance as tolerated.  Pelvic rest for 6 weeks.  Diet: routine diet Future Appointments: Future Appointments  Date Time Provider Department Center  06/26/2020  1:30 PM [facility] None  06/26/2020  2:10 PM Ferguson, John V, MD [facility]   Follow up Visit:   Please schedule this patient for a Virtual postpartum visit in 4 weeks with the following provider: Any provider. Additional Postpartum F/U:BP check 2-3 days  High risk pregnancy complicated by: HTN Delivery mode:  Vaginal, Spontaneous  Anticipated Birth Control:  Plans Interval BTL and Depo   06/26/2020 Alicia C Firestone, MD   

## 2020-06-26 ENCOUNTER — Encounter: Payer: Medicaid Other | Admitting: Obstetrics and Gynecology

## 2020-06-26 ENCOUNTER — Other Ambulatory Visit: Payer: Medicaid Other

## 2020-06-26 MED ORDER — IBUPROFEN 600 MG PO TABS
600.0000 mg | ORAL_TABLET | Freq: Four times a day (QID) | ORAL | 0 refills | Status: DC
Start: 2020-06-26 — End: 2020-08-22

## 2020-06-26 MED ORDER — ACETAMINOPHEN 500 MG PO TABS: 1000.0000 mg | ORAL_TABLET | Freq: Four times a day (QID) | ORAL | 0 refills | Status: DC | PRN

## 2020-06-28 ENCOUNTER — Encounter: Payer: Self-pay | Admitting: *Deleted

## 2020-06-28 ENCOUNTER — Ambulatory Visit (INDEPENDENT_AMBULATORY_CARE_PROVIDER_SITE_OTHER): Payer: Medicaid Other | Admitting: *Deleted

## 2020-06-28 ENCOUNTER — Other Ambulatory Visit: Payer: Self-pay | Admitting: Women's Health

## 2020-06-28 VITALS — BP 157/97 | HR 74 | Ht 64.0 in | Wt 131.0 lb

## 2020-06-28 DIAGNOSIS — Z0130 Encounter for examination of blood pressure without abnormal findings: Secondary | ICD-10-CM

## 2020-06-28 MED ORDER — AMLODIPINE BESYLATE 5 MG PO TABS: 5.0000 mg | ORAL_TABLET | Freq: Every day | ORAL | 0 refills | Status: DC

## 2020-06-28 NOTE — Progress Notes (Addendum)
   NURSE VISIT- BLOOD PRESSURE CHECK  SUBJECTIVE:  Rachel Orr is a 39 y.o. [id number] female here for BP check. She is postpartum, delivery date August 29.    HYPERTENSION ROS:  Pregnant/postpartum:  . Severe headaches that don't go away with tylenol/other medicines: No  . Visual changes (seeing spots/double/blurred vision) No  . Severe pain under right breast breast or in center of upper chest No  . Severe nausea/vomiting No  . Taking medicines as instructed no   OBJECTIVE:  BP (!) 156/94 (BP Location: Left Arm, Patient Position: Sitting, Cuff Size: Normal)   Pulse 85   Ht 5\' 4"  (1.626 m)   Wt 131 lb (59.4 kg)   Breastfeeding No   BMI 22.49 kg/m   Appearance alert, well appearing, and in no distress.  ASSESSMENT: Postpartum  blood pressure check  PLAN: Discussed with , CNM, [facility]   Recommendations: new prescription will be sent   Follow-up: September 3.   09-06-1975  06/28/2020 2:47 PM   Chart reviewed for nurse visit. Agree with plan of care. Rx norvasc 5mg , f/u Friday for bp recheck.  , Monday 06/28/2020 4:47 PM

## 2020-06-30 ENCOUNTER — Other Ambulatory Visit: Payer: Self-pay

## 2020-06-30 ENCOUNTER — Ambulatory Visit (INDEPENDENT_AMBULATORY_CARE_PROVIDER_SITE_OTHER): Payer: Medicaid Other | Admitting: *Deleted

## 2020-06-30 VITALS — BP 132/84 | HR 82 | Ht 64.0 in | Wt 131.0 lb

## 2020-06-30 DIAGNOSIS — Z1389 Encounter for screening for other disorder: Secondary | ICD-10-CM

## 2020-06-30 DIAGNOSIS — Z0130 Encounter for examination of blood pressure without abnormal findings: Secondary | ICD-10-CM

## 2020-06-30 DIAGNOSIS — Z331 Pregnant state, incidental: Secondary | ICD-10-CM

## 2020-06-30 NOTE — Progress Notes (Addendum)
   NURSE VISIT- BLOOD PRESSURE CHECK  SUBJECTIVE:  Rachel Orr is a 39 y.o. [id number] female here for BP check. She is postpartum, delivery date 06/25/20    HYPERTENSION ROS:  Pregnant/postpartum:  . Severe headaches that don't go away with tylenol/other medicines: No  . Visual changes (seeing spots/double/blurred vision) No  . Severe pain under right breast breast or in center of upper chest No  . Severe nausea/vomiting No  . Taking medicines as instructed yes   OBJECTIVE:  BP 132/84 (BP Location: Left Arm, Patient Position: Sitting, Cuff Size: Normal)   Pulse 82   Ht 5\' 4"  (1.626 m)   Wt 131 lb (59.4 kg)   Breastfeeding No   BMI 22.49 kg/m   Appearance alert, well appearing, and in no distress.  ASSESSMENT: Postpartum  blood pressure check  PLAN: Discussed with , CNM, [facility]   Recommendations: stop medicine 2 days before next visit   Follow-up: as scheduled   [facility]  06/30/2020 10:48 AM   Chart reviewed for nurse visit. Agree with plan of care.  08/30/2020, Cheral Marker 06/30/2020 1:03 PM

## 2020-07-07 ENCOUNTER — Telehealth: Payer: Self-pay

## 2020-07-07 NOTE — Telephone Encounter (Signed)
Pt is ready to go back to work. Baby will be 2 weeks on Sunday. I advised she needs an appt with provider before going back to work that soon. Pt voiced understanding and call was transferred to [facility] for appt. JSY

## 2020-07-07 NOTE — Telephone Encounter (Signed)
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

## 2020-07-10 ENCOUNTER — Encounter: Payer: Self-pay | Admitting: Obstetrics & Gynecology

## 2020-07-10 ENCOUNTER — Encounter: Payer: Medicaid Other | Admitting: Obstetrics & Gynecology

## 2020-07-10 ENCOUNTER — Ambulatory Visit (INDEPENDENT_AMBULATORY_CARE_PROVIDER_SITE_OTHER): Payer: Medicaid Other | Admitting: Obstetrics & Gynecology

## 2020-07-10 VITALS — BP 128/80 | HR 69 | Ht 64.0 in | Wt 126.0 lb

## 2020-07-10 DIAGNOSIS — Z7689 Persons encountering health services in other specified circumstances: Secondary | ICD-10-CM

## 2020-07-10 NOTE — Progress Notes (Signed)
   Patient: Rachel Orr  Date of Birth: 03-Mar-1981  Date of Visit: 07/10/2020    To Whom It May Concern:  It is my medical opinion that Rachel Orr may return to full duty immediately with no restrictions.  If you have any questions or concerns, please don't hesitate to call.  Sincerely,    Lazaro Arms, MD 07/10/2020 11:17 AM

## 2020-07-31 ENCOUNTER — Ambulatory Visit: Payer: Medicaid Other | Admitting: Advanced Practice Midwife

## 2020-08-08 IMAGING — US US OB < 14 WEEKS - US OB TV
1 series · 14 of 28 positions shown · non-contrast
Comparison: None.

CLINICAL DATA: 38-year-old female with pelvic pain for 6 days.
Unknown LMP. Quantitative beta HCG 179,336 today.

EXAM:
OBSTETRIC <14 WK ULTRASOUND
TECHNIQUE: Transabdominal ultrasound was performed for evaluation of the
gestation as well as the maternal uterus and adnexal regions.

[Series 1: us ob < 14 weeks - us ob tv · 0.25mm/px · 14 of 129 slices shown]
[im 5/129]
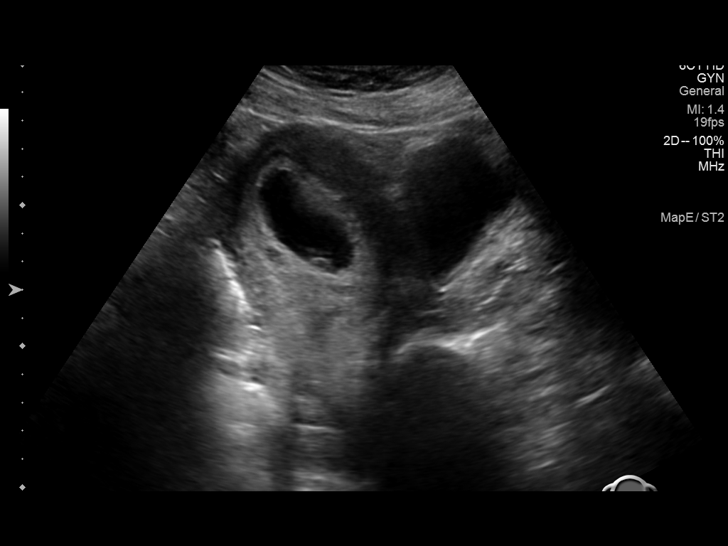
[im 15/129]
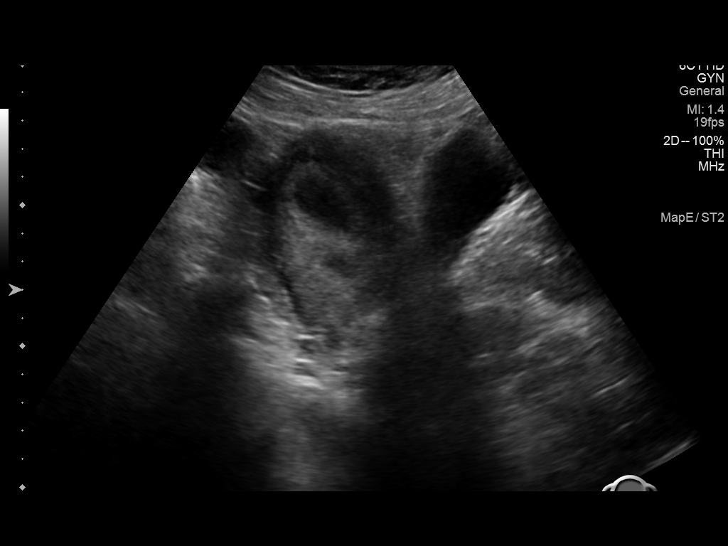
[im 24/129]
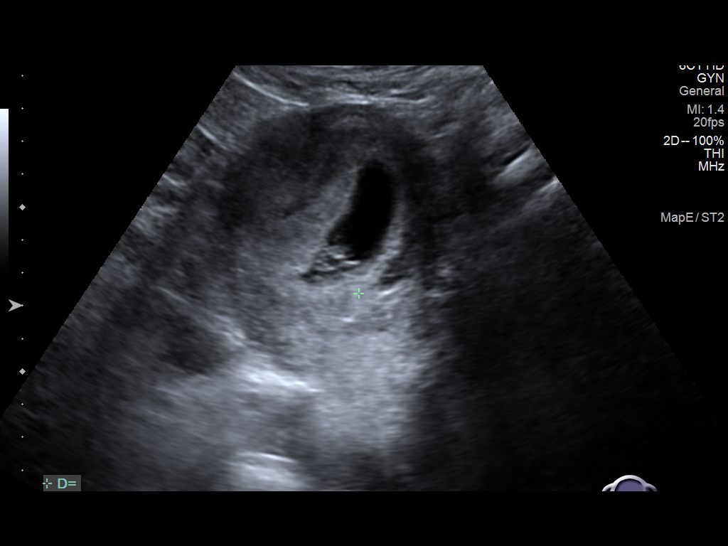
[im 34/129]
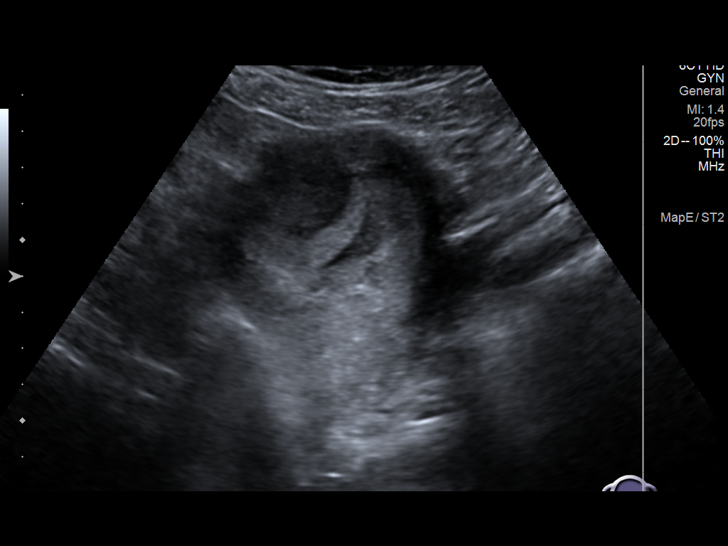
[im 43/129]
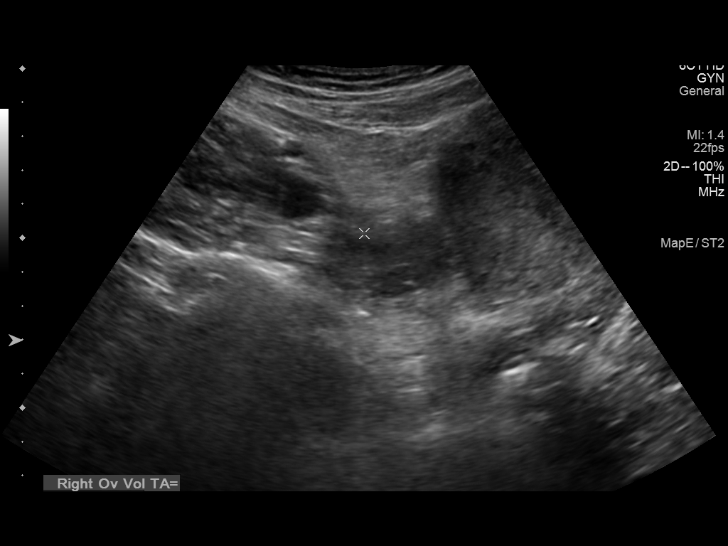
[im 53/129]
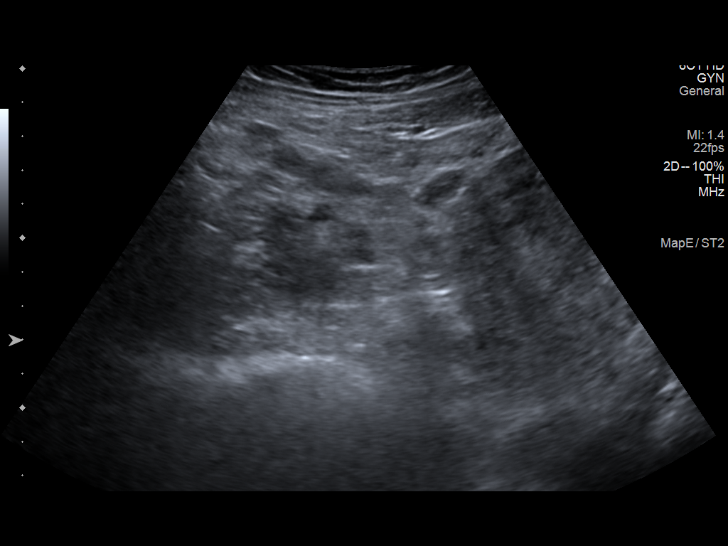
[im 62/129]
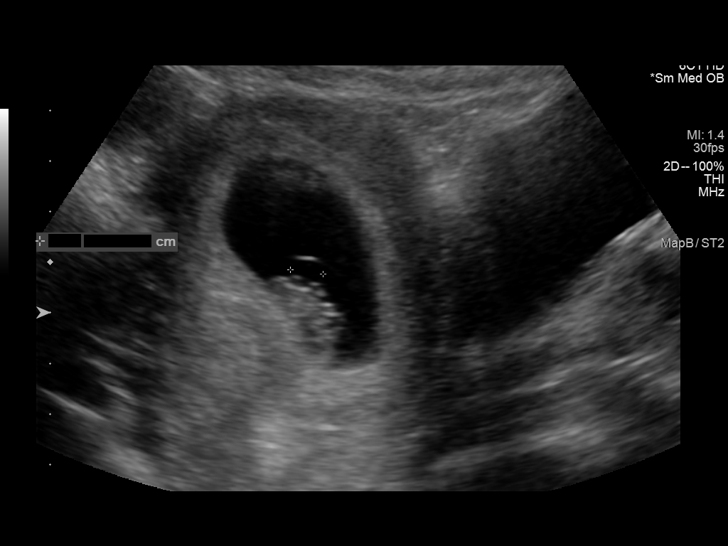
[im 72/129]
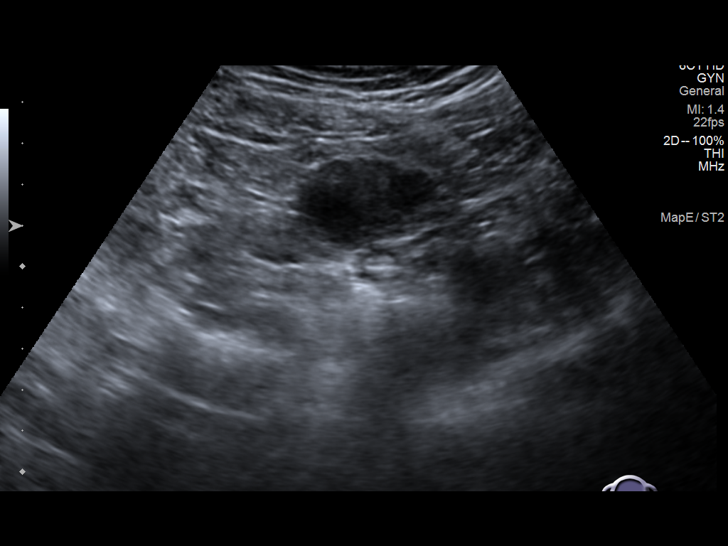
[im 81/129]
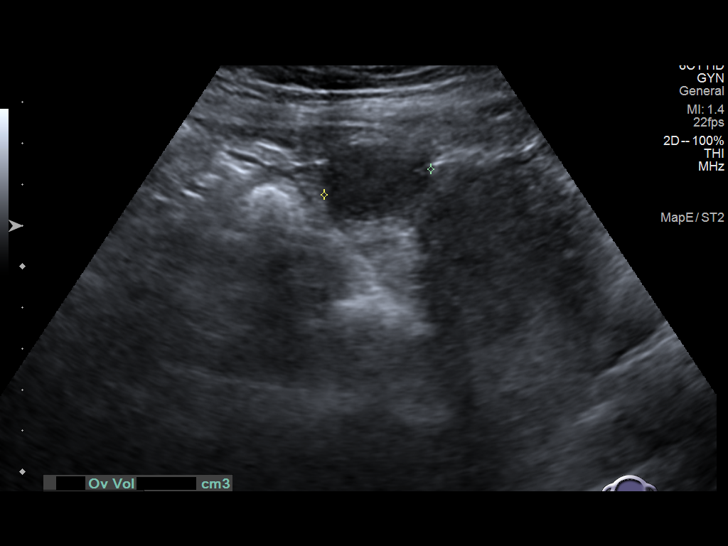
[im 91/129]
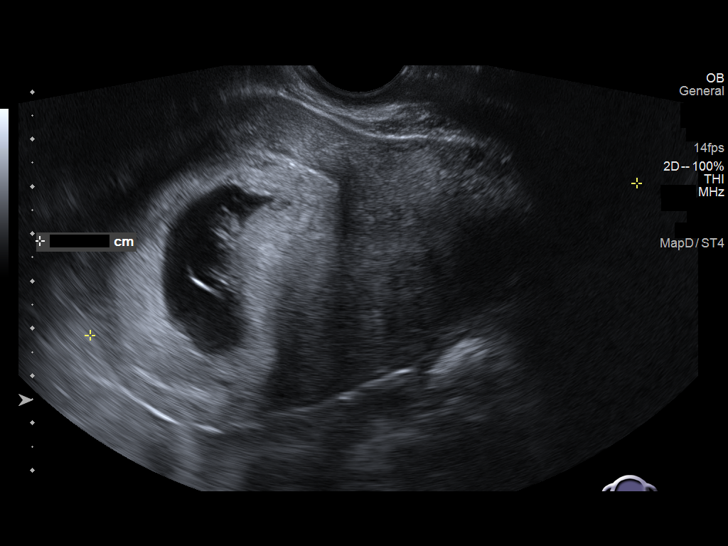
[im 100/129]
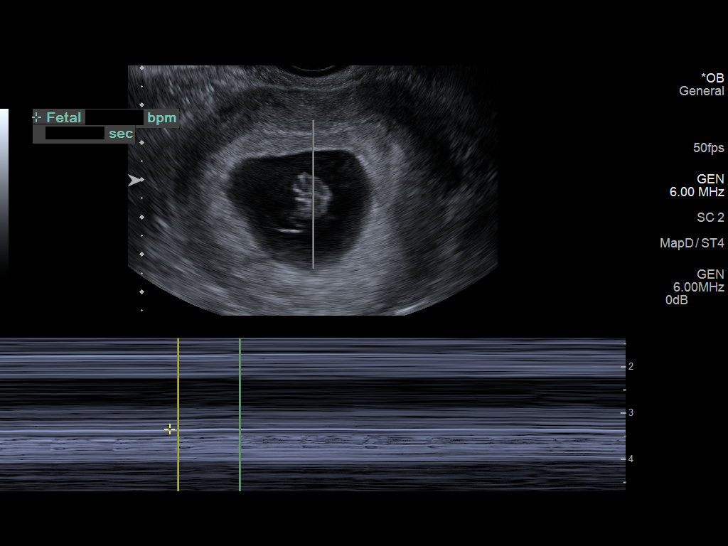
[im 110/129]
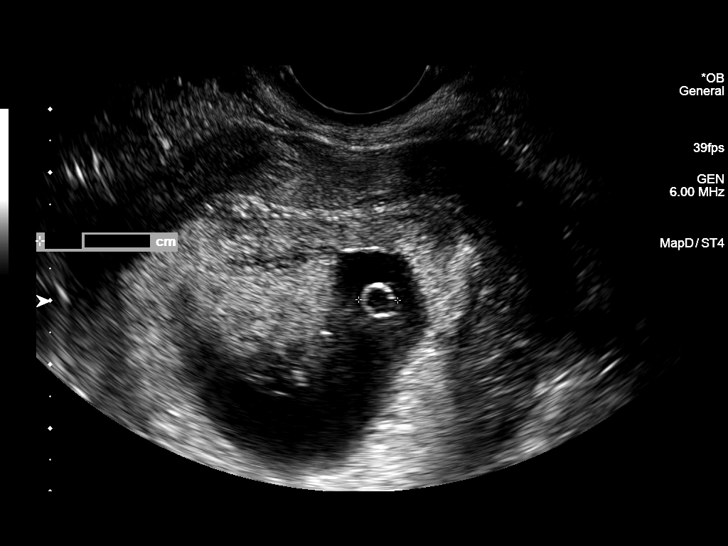
[im 119/129]
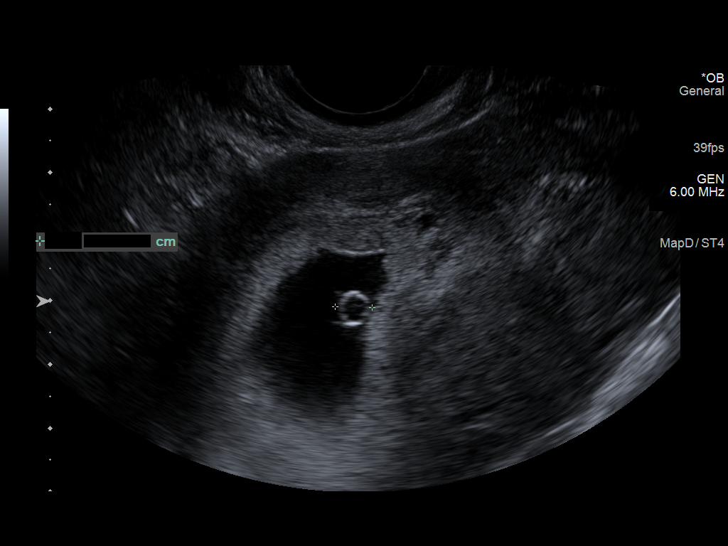
[im 129/129]
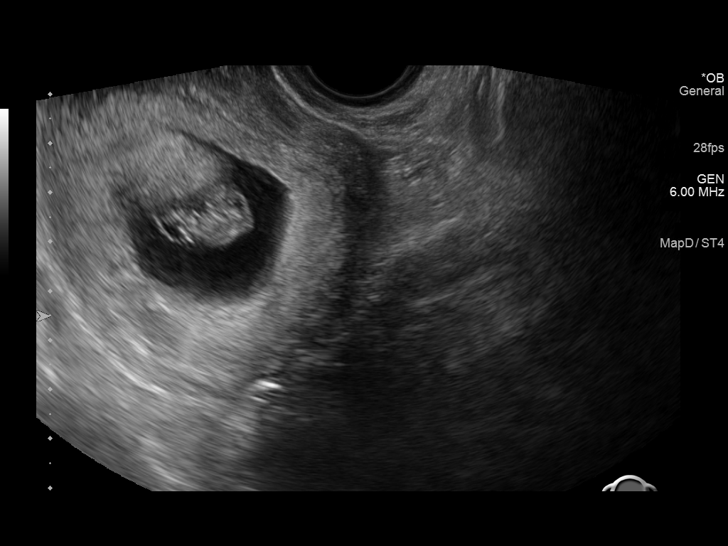

[14 of 28 positions shown; findings below may reference images not displayed]

FINDINGS: Intrauterine gestational sac: Single

Yolk sac:  Visible

Embryo:  Visible

Cardiac Activity: Detected

Heart Rate: 165 bpm

CRL:   1.8 mm   8 w 2 d                  US EDC: 06/30/2020

Subchorionic hemorrhage:  None visualized.

Maternal uterus/adnexae: No pelvic free fluid. The right ovary
appears normal measuring 3.7 x 2.3 x 3.7 centimeters (volume 18
milliliters). The left ovary appears normal measuring 3.7 x 2.3 x
2.6 centimeters (volume 12 milliliters).
IMPRESSION: Single living IUP demonstrated. No acute maternal findings
visualized.

## 2020-08-09 ENCOUNTER — Ambulatory Visit (INDEPENDENT_AMBULATORY_CARE_PROVIDER_SITE_OTHER): Payer: Medicaid Other | Admitting: Advanced Practice Midwife

## 2020-08-09 ENCOUNTER — Encounter: Payer: Self-pay | Admitting: Advanced Practice Midwife

## 2020-08-09 NOTE — Progress Notes (Signed)
POSTPARTUM VISIT Patient name: Rachel Orr MRN [id number]  Date of birth: [birth year] Chief Complaint:   Postpartum Care  History of Present Illness:   Rachel Orr is a 39 y.o. [id number] Caucasian female being seen today for a postpartum visit. She is 6 weeks postpartum following a spontaneous vaginal delivery at 39.2 gestational weeks. IOL: Yes, for decreased fetal movement. Anesthesia: none.  Laceration: labial- not repaired.  Complications: dx with gHTN; did not require antihypertensive until she was home from the hospital on PPD#3 (Norvasc 82m). Inpatient contraception: yes , received DMPA prior to discharge.   Pregnancy complicated by dx of gHTN in labor/PP. Tobacco use: yes ..Marland KitchenSubstance use disorder: no. Last pap smear: May 2021 and results were normal. Next pap smear due: May 2024 No LMP recorded. Patient has had an injection.  Postpartum course has been complicated by requiring Norvasc 59mfor a couple of weeks, but BP is stable now. Bleeding no bleeding. Bowel function is normal. Bladder function is normal. Urinary incontinence? No, fecal incontinence? No Patient is sexually active. Desired contraception: PP Depo bridge given; would like BTL. Patient does not want a pregnancy in the future.  Desired family size is 5 children.   Upstream - 08/09/20 1458      Pregnancy Intention Screening   Does the patient want to become pregnant in the next year? No    Does the patient's partner want to become pregnant in the next year? No    Would the patient like to discuss contraceptive options today? Yes      Contraception Wrap Up   Current Method Hormonal Injection    Contraception Counseling Provided Yes          The pregnancy intention screening data noted above was reviewed. Potential methods of contraception were discussed. The patient elected to proceed with Female Sterilization.   Edinburgh Postpartum Depression Screening: negative  Edinburgh Postnatal Depression Scale -  08/09/20 1457      Edinburgh Postnatal Depression Scale:  In the Past 7 Days   I have been able to laugh and see the funny side of things. 0    I have looked forward with enjoyment to things. 0    I have blamed myself unnecessarily when things went wrong. 0    I have been anxious or worried for no good reason. 0    I have felt scared or panicky for no good reason. 0    Things have been getting on top of me. 0    I have been so unhappy that I have had difficulty sleeping. 0    I have felt sad or miserable. 0    I have been so unhappy that I have been crying. 0    The thought of harming myself has occurred to me. 0    Edinburgh Postnatal Depression Scale Total 0          Baby's course has been uncomplicated. Baby is feeding by bottle. Infant has a pediatrician/family doctor? Yes.  Childcare strategy if returning to work/school: FOB watches her during the day.  Pt has material needs met for her and baby: Yes.   Review of Systems:   Pertinent items are noted in HPI Denies Abnormal vaginal discharge w/ itching/odor/irritation, headaches, visual changes, shortness of breath, chest pain, abdominal pain, severe nausea/vomiting, or problems with urination or bowel movements. Pertinent History Reviewed:  Reviewed past medical,surgical, obstetrical and family history.  Reviewed problem list, medications and allergies. [place]  [place]  Para Term Preterm AB Living  _0 SAB TAB Ectopic Multiple Live Births        0 5    # Outcome Date GA Lbr Len/2nd Weight Sex Delivery Anes PTL Lv  5 Term 06/25/20 26w2d00:44 / 00:07 6 lb 2.8 oz (2.801 kg) F Vag-Spont None  LIV     Birth Comments: WDL  4 Term 09/30/11 442w0d8 lb (3.629 kg) M Vag-Spont None N LIV  3 Term 02/07/05 4086w0d lb (3.629 kg) F Vag-Spont None N LIV  2 Term 02/16/03 40w81w0dlb (3.175 kg) F Vag-Spont Other N LIV  1 Term 11/14/98 39w079w0db 13 oz (3.09 kg) F Vag-Spont None N LIV   Physical Assessment:   Vitals:    08/09/20 1454  BP: 124/80  Pulse: 94  Weight: 129 lb (58.5 kg)  Body mass index is 22.14 kg/m.       Physical Examination:   General appearance: alert, well appearing, and in no distress  Mental status: alert, oriented to person, place, and time  Skin: warm & dry   Cardiovascular: normal heart rate noted   Respiratory: normal respiratory effort, no distress   Breasts: deferred, no complaints   Abdomen: soft, non-tender   Pelvic: exam declined by the patient  Extremities: no edema       No results found for this or any previous visit (from the past 24 hour(s)).  Assessment & Plan:  1) Postpartum exam 2) Six wks s/p spontaneous vaginal delivery 3) bottle feeding 4) Depression screening 5) Contraception counseling- received DMPA prior to hospital discharge- would like interval BTL with Dr Eure Elonda Huskyential components of care per ACOG recommendations:  1.  Mood and well being:  . If positive depression screen, discussed and plan developed.  . If using tobacco we discussed reduction/cessation and risk of relapse . If current substance abuse, we discussed and referral to local resources was offered.   2. Infant care and feeding:  . If breastfeeding, discussed returning to work, pumping, breastfeeding-associated pain, guidance regarding return to fertility while lactating if not using another method. If needed, patient was provided with a letter to be allowed to pump q 2-3hrs to support lactation in a private location with access to a refrigerator to store breastmilk.   . Recommended that all caregivers be immunized for flu, pertussis and other preventable communicable diseases . If pt does not have material needs met for her/baby, referred to local resources for help obtaining these.  3. Sexuality, contraception and birth spacing . Provided guidance regarding sexuality, management of dyspareunia, and resumption of intercourse . Discussed avoiding interpregnancy interval <6mths43md  recommended birth spacing of 18 months  4. Sleep and fatigue . Discussed coping options for fatigue and sleep disruption . Encouraged family/partner/community support of 4 hrs of uninterrupted sleep to help with mood and fatigue  5. Physical recovery  . If pt had a C/S, assessed incisional pain and providing guidance on normal vs prolonged recovery . If pt had a laceration, perineal healing and pain reviewed.  . If urinary or fecal incontinence, discussed management and referred to PT or uro/gyn if indicated  . Patient is safe to resume physical activity. Discussed attainment of healthy weight.  6.  Chronic disease management . Discussed pregnancy complications if any, and their implications for future childbearing and long-term maternal health. . Review recommendations for prevention of recurrent pregnancy complications, such as  17 hydroxyprogesterone caproate to reduce risk for recurrent PTB not applicable, or aspirin to reduce risk of preeclampsia n/a as she is planning BTL. . Pt had GDM: No. If yes, 2hr GTT scheduled: not applicable. . Reviewed medications and non-pregnant dosing including consideration of whether pt is breastfeeding using a reliable resource such as LactMed: not applicable . Referred for f/u w/ PCP or subspecialist providers as indicated: not applicable  7. Health maintenance . Mammogram at 38yo or earlier if indicated . Pap smears as indicated  Meds: No orders of the defined types were placed in this encounter.   Follow-up: Return for first avail, MyChart with Dr Elonda Husky for BTL preop appt.   No orders of the defined types were placed in this encounter.   Myrtis Ser CNM 08/09/2020 3:14 PM

## 2020-08-09 NOTE — Patient Instructions (Signed)

## 2020-08-22 ENCOUNTER — Encounter: Payer: Self-pay | Admitting: Obstetrics & Gynecology

## 2020-08-22 ENCOUNTER — Encounter: Payer: Medicaid Other | Admitting: Obstetrics & Gynecology

## 2020-08-22 ENCOUNTER — Encounter: Payer: Self-pay | Admitting: *Deleted

## 2020-08-22 NOTE — Progress Notes (Signed)
This encounter was created in error - please disregard.

## 2020-09-04 ENCOUNTER — Telehealth: Payer: Self-pay | Admitting: *Deleted

## 2020-09-04 ENCOUNTER — Telehealth: Payer: Self-pay | Admitting: Women's Health

## 2020-09-04 NOTE — Telephone Encounter (Signed)
Pt called to ask when is her next Depo due, states she got it at the hospital in August Couldn't find documentation of this in chart  Please advise & notify pt

## 2020-09-04 NOTE — Telephone Encounter (Signed)
Depo received 8/29.  Needs next injection between 11/15-11/29. Pt scheduled.

## 2020-09-11 ENCOUNTER — Other Ambulatory Visit: Payer: Self-pay | Admitting: Obstetrics & Gynecology

## 2020-09-11 ENCOUNTER — Telehealth: Payer: Self-pay | Admitting: Women's Health

## 2020-09-11 MED ORDER — MEDROXYPROGESTERONE ACETATE 150 MG/ML IM SUSP
150.0000 mg | INTRAMUSCULAR | 3 refills | Status: DC
Start: 2020-09-11 — End: 2021-08-27

## 2020-09-11 NOTE — Telephone Encounter (Signed)
Patient called stating that she needs for [place] to send her Depo refill to the [facility] on [address] here in [place]. Please contact pt when done.

## 2020-09-12 ENCOUNTER — Ambulatory Visit (INDEPENDENT_AMBULATORY_CARE_PROVIDER_SITE_OTHER): Payer: Medicaid Other | Admitting: *Deleted

## 2020-09-12 ENCOUNTER — Other Ambulatory Visit: Payer: Self-pay

## 2020-09-12 ENCOUNTER — Telehealth: Payer: Self-pay | Admitting: *Deleted

## 2020-09-12 DIAGNOSIS — Z308 Encounter for other contraceptive management: Secondary | ICD-10-CM | POA: Diagnosis not present

## 2020-09-12 MED ORDER — MEDROXYPROGESTERONE ACETATE 150 MG/ML IM SUSP
150.0000 mg | Freq: Once | INTRAMUSCULAR | Status: AC
  Administered 2020-09-12: 150 mg via INTRAMUSCULAR

## 2020-09-12 NOTE — Telephone Encounter (Signed)
I spoke with Marchelle Folks and then called patient to make her aware she can call [facility] to have the prescription transferred.

## 2020-09-12 NOTE — Progress Notes (Signed)
   NURSE VISIT- INJECTION  SUBJECTIVE:  Rachel Orr is a 39 y.o. [id number] female here for a Depo Provera for contraception/period management. She is a GYN patient.   OBJECTIVE:  LMP 08/21/2020   Appears well, in no apparent distress  Injection administered in: Left deltoid  Meds ordered this encounter  Medications  . medroxyPROGESTERone (DEPO-PROVERA) injection 150 mg    ASSESSMENT: GYN patient Depo Provera for contraception/period management PLAN: Follow-up: in 11-13 weeks for next Depo   Malachy Mood  09/12/2020 4:04 PM

## 2020-09-12 NOTE — Telephone Encounter (Signed)
Patient has an appointment today at 2:50 for her depo, [facility]. [facility] in [place] since she has medicaid. Can [facility] be changed to [facility] before for appointment

## 2020-09-20 ENCOUNTER — Ambulatory Visit: Payer: Medicaid Other

## 2020-10-26 ENCOUNTER — Ambulatory Visit: Payer: Medicaid Other

## 2020-11-01 ENCOUNTER — Telehealth: Payer: Self-pay | Admitting: Obstetrics & Gynecology

## 2020-11-01 NOTE — Telephone Encounter (Signed)
Spoke with patient. She states that she has had some flank pain and dysuria. Scheduled her to come in for a visit tomorrow to do a urine sample. Patient agreeable, no other questions at this time.

## 2020-11-01 NOTE — Telephone Encounter (Signed)
Patient wants to know if a prescription can be called in for a UTI. Clincal staff will follow up with patient.

## 2020-11-02 ENCOUNTER — Other Ambulatory Visit (INDEPENDENT_AMBULATORY_CARE_PROVIDER_SITE_OTHER): Payer: Medicaid Other

## 2020-11-02 ENCOUNTER — Other Ambulatory Visit: Payer: Self-pay | Admitting: Obstetrics and Gynecology

## 2020-11-02 ENCOUNTER — Other Ambulatory Visit: Payer: Self-pay

## 2020-11-02 DIAGNOSIS — R399 Unspecified symptoms and signs involving the genitourinary system: Secondary | ICD-10-CM

## 2020-11-02 DIAGNOSIS — N3000 Acute cystitis without hematuria: Secondary | ICD-10-CM

## 2020-11-02 LAB — POCT URINALYSIS DIPSTICK OB
Blood, UA: NEGATIVE
Glucose, UA: NEGATIVE
Ketones, UA: NEGATIVE
Nitrite, UA: POSITIVE
POC,PROTEIN,UA: NEGATIVE

## 2020-11-02 MED ORDER — NITROFURANTOIN MONOHYD MACRO 100 MG PO CAPS: 100.0000 mg | ORAL_CAPSULE | Freq: Two times a day (BID) | ORAL | 1 refills | Status: DC

## 2020-11-02 NOTE — Addendum Note (Signed)
Addended by: Leilani Able, Kaiden Pech A on: 11/02/2020 03:11 PM   Modules accepted: Orders

## 2020-11-02 NOTE — Progress Notes (Signed)
Rx for UTI Sx sent to pharmacy.

## 2020-11-02 NOTE — Progress Notes (Signed)
   NURSE VISIT- UTI SYMPTOMS   SUBJECTIVE:  Rachel Orr is a 40 y.o. [id number] female here for UTI symptoms. She is a GYN patient. She reports flank pain bilaterally and strong odor, burning.  OBJECTIVE:  There were no vitals taken for this visit.  Appears well, in no apparent distress  Results for orders placed or performed in visit on 11/02/20 (from the past 24 hour(s))  POC Urinalysis Dipstick OB   Collection Time: 11/02/20  3:00 PM  Result Value Ref Range   Color, UA     Clarity, UA     Glucose, UA Negative Negative   Bilirubin, UA     Ketones, UA neg    Spec Grav, UA     Blood, UA neg    pH, UA     POC,PROTEIN,UA Negative Negative, Trace, Small (1+), Moderate (2+), Large (3+), 4+   Urobilinogen, UA     Nitrite, UA pos    Leukocytes, UA Moderate (2+) (A) Negative   Appearance     Odor      ASSESSMENT: GYN patient with UTI symptoms and positive nitrites  PLAN: Discussed with Dr. Alysia Penna   Rx sent by provider today: Yes Urine culture sent Call or return to clinic prn if these symptoms worsen or fail to improve as anticipated. Follow-up: as needed   Simrit Gohlke A Cheney Gosch  11/02/2020 3:01 PM

## 2020-11-06 ENCOUNTER — Telehealth: Payer: Self-pay | Admitting: Women's Health

## 2020-11-06 LAB — URINE CULTURE

## 2020-11-06 NOTE — Telephone Encounter (Signed)
Pt calling to check on urine culture results   Please advise & notify pt

## 2020-11-06 NOTE — Telephone Encounter (Signed)
Pt aware urine culture was + for e. coli. Pt advised to continue taking Macrobid BID and always wipe front to back. If anything changes once sensitivities are back, will let her know.  Pt voiced understanding. JSY

## 2020-11-24 ENCOUNTER — Telehealth: Payer: Self-pay | Admitting: Adult Health

## 2020-11-24 NOTE — Telephone Encounter (Signed)
Pt is on Depo. Next shot is 2/8. Pt is having some spotting, not enough to wear a pad. I advised some people spot when their next shot is due. Pt complained of stomach pain. Pt was advised if stomach pain continues, let us know. Pt voiced understanding. JSY

## 2020-11-24 NOTE — Telephone Encounter (Signed)
Patient called stating that she is on the depo and she has been spotting, like a period after a period. Patient would like to know if this is normal. Please contact pt

## 2020-12-02 ENCOUNTER — Emergency Department (HOSPITAL_COMMUNITY): Payer: Medicaid Other

## 2020-12-02 ENCOUNTER — Encounter (HOSPITAL_COMMUNITY): Payer: Self-pay

## 2020-12-02 ENCOUNTER — Emergency Department (HOSPITAL_COMMUNITY)
Admission: EM | Admit: 2020-12-02 | Discharge: 2020-12-02 | Disposition: A | Discharge status: Home / Self Care | Payer: Medicaid Other | Attending: Emergency Medicine | Admitting: Emergency Medicine

## 2020-12-02 ENCOUNTER — Other Ambulatory Visit: Payer: Self-pay

## 2020-12-02 DIAGNOSIS — F17210 Nicotine dependence, cigarettes, uncomplicated: Secondary | ICD-10-CM | POA: Insufficient documentation

## 2020-12-02 DIAGNOSIS — R42 Dizziness and giddiness: Secondary | ICD-10-CM | POA: Insufficient documentation

## 2020-12-02 DIAGNOSIS — N3000 Acute cystitis without hematuria: Secondary | ICD-10-CM | POA: Insufficient documentation

## 2020-12-02 DIAGNOSIS — I10 Essential (primary) hypertension: Secondary | ICD-10-CM | POA: Diagnosis not present

## 2020-12-02 DIAGNOSIS — N83201 Unspecified ovarian cyst, right side: Secondary | ICD-10-CM | POA: Diagnosis not present

## 2020-12-02 DIAGNOSIS — R1031 Right lower quadrant pain: Secondary | ICD-10-CM | POA: Diagnosis present

## 2020-12-02 DIAGNOSIS — B3749 Other urogenital candidiasis: Secondary | ICD-10-CM | POA: Diagnosis not present

## 2020-12-02 HISTORY — DX: Essential (primary) hypertension: I10

## 2020-12-02 LAB — CBC WITH DIFFERENTIAL/PLATELET
Abs Immature Granulocytes: 0.04 10*3/uL (ref 0.00–0.07)
Basophils Absolute: 0.1 10*3/uL (ref 0.0–0.1)
Basophils Relative: 0 %
Eosinophils Absolute: 0.1 10*3/uL (ref 0.0–0.5)
Eosinophils Relative: 1 %
HCT: 44 % (ref 36.0–46.0)
Hemoglobin: 14.1 g/dL (ref 12.0–15.0)
Immature Granulocytes: 0 %
Lymphocytes Relative: 12 %
Lymphs Abs: 1.7 10*3/uL (ref 0.7–4.0)
MCH: 31.2 pg (ref 26.0–34.0)
MCHC: 32 g/dL (ref 30.0–36.0)
MCV: 97.3 fL (ref 80.0–100.0)
Monocytes Absolute: 1.3 10*3/uL — ABNORMAL HIGH (ref 0.1–1.0)
Monocytes Relative: 10 %
Neutro Abs: 10.4 10*3/uL — ABNORMAL HIGH (ref 1.7–7.7)
Neutrophils Relative %: 77 %
Platelets: 388 10*3/uL (ref 150–400)
RBC: 4.52 MIL/uL (ref 3.87–5.11)
RDW: 12.9 % (ref 11.5–15.5)
WBC: 13.6 10*3/uL — ABNORMAL HIGH (ref 4.0–10.5)
nRBC: 0 % (ref 0.0–0.2)

## 2020-12-02 LAB — URINALYSIS, ROUTINE W REFLEX MICROSCOPIC
Bilirubin Urine: NEGATIVE
Glucose, UA: NEGATIVE mg/dL
Ketones, ur: NEGATIVE mg/dL
Nitrite: POSITIVE — AB
Protein, ur: NEGATIVE mg/dL
Specific Gravity, Urine: 1.012 (ref 1.005–1.030)
WBC, UA: >50 WBC/hpf — ABNORMAL HIGH (ref 0–5)
pH: 5 (ref 5.0–8.0)

## 2020-12-02 LAB — COMPREHENSIVE METABOLIC PANEL
ALT: 14 U/L (ref 0–44)
AST: 16 U/L (ref 15–41)
Albumin: 3.9 g/dL (ref 3.5–5.0)
Alkaline Phosphatase: 65 U/L (ref 38–126)
Anion gap: 6 (ref 5–15)
BUN: 8 mg/dL (ref 6–20)
CO2: 26 mmol/L (ref 22–32)
Calcium: 8.9 mg/dL (ref 8.9–10.3)
Chloride: 104 mmol/L (ref 98–111)
Creatinine, Ser: 0.56 mg/dL (ref 0.44–1.00)
GFR, Estimated: >60 mL/min (ref >60)
Glucose, Bld: 96 mg/dL (ref 70–99)
Potassium: 3.9 mmol/L (ref 3.5–5.1)
Sodium: 136 mmol/L (ref 135–145)
Total Bilirubin: 1.2 mg/dL (ref 0.3–1.2)
Total Protein: 8 g/dL (ref 6.5–8.1)

## 2020-12-02 LAB — LIPASE, BLOOD: Lipase: 32 U/L (ref 11–51)

## 2020-12-02 LAB — PREGNANCY, URINE: Preg Test, Ur: NEGATIVE

## 2020-12-02 MED ORDER — IOHEXOL 300 MG/ML  SOLN
80.0000 mL | Freq: Once | INTRAMUSCULAR | Status: AC | PRN
  Administered 2020-12-02: 80 mL via INTRAVENOUS

## 2020-12-02 MED ORDER — SODIUM CHLORIDE 0.9 % IV SOLN
2.0000 g | Freq: Once | INTRAVENOUS | Status: AC
  Administered 2020-12-02: 2 g via INTRAVENOUS
  Filled 2020-12-02: qty 20

## 2020-12-02 MED ORDER — CEPHALEXIN 500 MG PO CAPS: 500.0000 mg | ORAL_CAPSULE | Freq: Four times a day (QID) | ORAL | 0 refills | Status: DC

## 2020-12-02 MED ORDER — KETOROLAC TROMETHAMINE 30 MG/ML IJ SOLN
30.0000 mg | Freq: Once | INTRAMUSCULAR | Status: AC
  Administered 2020-12-02: 30 mg via INTRAVENOUS
  Filled 2020-12-02: qty 1

## 2020-12-02 NOTE — ED Triage Notes (Signed)
Pt presents to ED with complaints of right flank pain x 2-3 weeks. Pt states she has a history of UTI and e coli. Pt denies urinary symptoms at this time.

## 2020-12-02 NOTE — ED Notes (Signed)
Pt back from ct

## 2020-12-02 NOTE — ED Notes (Signed)
Pt to CT at this time.

## 2020-12-02 NOTE — ED Notes (Signed)
Pt tolerated fluid challenge. 

## 2020-12-02 NOTE — Discharge Instructions (Addendum)
Your urine shows infection today. Please pick up antibiotics and take as prescribed. We have sent your urine for culture and will call in 2-3 days time if the antibiotic needs to be changed.   Continue taking Ibuprofen and Tylenol as needed for pain.   Your CT scan showed an ovarian cyst on the right side; please follow up with your OBGYN regarding this. They can also recheck your urine in 1 week to make sure the infection has cleared.   Return to the ED IMMEDIATELY for any worsening symptoms including worsening pain, persistent vomiting, fevers > 100.4, passing out, or any other new/concerning symptoms.

## 2020-12-02 NOTE — ED Provider Notes (Signed)
[facility] EMERGENCY DEPARTMENT Provider Note   CSN: [id number] Arrival date & time: 12/02/20  1111     History Chief Complaint  Patient presents with  . Flank Pain    Rachel Orr is a 40 y.o. female with PMHx HTN who presents to the ED today with complaint of gradual onset, constant, sharp, right flank pain/RLQ abdominal pain x 1 week. Pt reports that she was treated for a UTI 1 month ago after having foul smelling urine. She completed the entire course of Macrobid however reports she continued to have symptoms after that with new pain last week. She also complains of nausea and lightheadedness. Pt reports history of pyelonephritis in the past when she was pregnant and states this feels similar. Pt denies fevers, chills, vomiting, diarrhea, dysuria, hematuria, or any other associated symptoms. No past surgical history to abdomen. No history of kidney stones.   The history is provided by the patient and medical records.       Past Medical History:  Diagnosis Date  . Anemia   . Hypertension     Patient Active Problem List   Diagnosis Date Noted  . Smoker 12/22/2019    Past Surgical History:  Procedure Laterality Date  . NO PAST SURGERIES       OB History    Gravida  5   Para  5   Term  5   Preterm      AB      Living  5     SAB      IAB      Ectopic      Multiple  0   Live Births  5           Family History  Problem Relation Age of Onset  . Asthma Son   . Hypertension Son   . Hypertension Father   . COPD Father   . Hypertension Sister   . Hypertension Paternal Uncle   . Diabetes Paternal Grandmother   . Hypertension Paternal Grandmother   . Asthma Paternal Grandmother   . COPD Paternal Grandmother     Social History   Tobacco Use  . Smoking status: Current Every Day Smoker    Packs/day: 1.00    Types: Cigarettes  . Smokeless tobacco: Never Used  Vaping Use  . Vaping Use: Never used  Substance Use Topics  . Alcohol use: No   . Drug use: No    Home Medications Prior to Admission medications   Medication Sig Start Date End Date Taking? Authorizing Provider  cephALEXin (KEFLEX) 500 MG capsule Take 1 capsule (500 mg total) by mouth 4 (four) times daily. 12/02/20  Yes Tesia Lybrand, PA-C  Ibuprofen (ADVIL PO) Take by mouth.    [provider]  medroxyPROGESTERone (DEPO-PROVERA) 150 MG/ML injection Inject 1 mL (150 mg total) into the muscle every 3 (three) months. 09/11/20   Lazaro Arms, MD  nitrofurantoin, macrocrystal-monohydrate, (MACROBID) 100 MG capsule Take 1 capsule (100 mg total) by mouth 2 (two) times daily. 11/02/20   Hermina Staggers, MD    Allergies    Patient has no known allergies.  Review of Systems   Review of Systems  Constitutional: Negative for chills and fever.  Gastrointestinal: Positive for abdominal pain and nausea. Negative for diarrhea and vomiting.  Genitourinary: Positive for flank pain and frequency. Negative for dysuria.  All other systems reviewed and are negative.   Physical Exam Updated Vital Signs BP (!) 142/95 (BP Location: Right  Arm)   Pulse 98   Temp 98.3 F (36.8 C) (Oral)   Resp 20   Ht 5\' 4"  (1.626 m)   Wt 63.5 kg   SpO2 98%   BMI 24.03 kg/m   Physical Exam Vitals and nursing note reviewed.  Constitutional:      Appearance: She is not ill-appearing or diaphoretic.  HENT:     Head: Normocephalic and atraumatic.  Eyes:     Conjunctiva/sclera: Conjunctivae normal.  Cardiovascular:     Rate and Rhythm: Normal rate and regular rhythm.     Pulses: Normal pulses.  Pulmonary:     Effort: Pulmonary effort is normal.     Breath sounds: Normal breath sounds. No wheezing, rhonchi or rales.  Abdominal:     Palpations: Abdomen is soft.     Tenderness: There is abdominal tenderness. There is right CVA tenderness and guarding (voluntary). There is no left CVA tenderness or rebound.     Comments: Soft, + diffuse abdominal TTP however worse in the LLQ, +BS  throughout, no r/g/r, neg murphy's, neg mcburney's, + right CVA TTP  Musculoskeletal:     Cervical back: Neck supple.  Skin:    General: Skin is warm and dry.  Neurological:     Mental Status: She is alert.     ED Results / Procedures / Treatments   Labs (all labs ordered are listed, but only abnormal results are displayed) Labs Reviewed  URINALYSIS, ROUTINE W REFLEX MICROSCOPIC - Abnormal; Notable for the following components:      Result Value   APPearance HAZY (*)    Hgb urine dipstick MODERATE (*)    Nitrite POSITIVE (*)    Leukocytes,Ua MODERATE (*)    WBC, UA >50 (*)    Bacteria, UA MANY (*)    All other components within normal limits  CBC WITH DIFFERENTIAL/PLATELET - Abnormal; Notable for the following components:   WBC 13.6 (*)    Neutro Abs 10.4 (*)    Monocytes Absolute 1.3 (*)    All other components within normal limits  URINE CULTURE  PREGNANCY, URINE  COMPREHENSIVE METABOLIC PANEL  LIPASE, BLOOD    EKG None  Radiology CT Abdomen Pelvis W Contrast  Result Date: 12/02/2020 CLINICAL DATA:  RIGHT flank pain EXAM: CT ABDOMEN AND PELVIS WITH CONTRAST TECHNIQUE: Multidetector CT imaging of the abdomen and pelvis was performed using the standard protocol following bolus administration of intravenous contrast. CONTRAST:  73mL OMNIPAQUE IOHEXOL 300 MG/ML  SOLN COMPARISON:  January 28, 2013 FINDINGS: Lower chest: No acute abnormality. Hepatobiliary: Focal fatty deposition adjacent to the falciform ligament. Gallbladder is unremarkable. Portal veins are patent. No intrahepatic or extrahepatic biliary ductal dilation. Pancreas: Unremarkable. No pancreatic ductal dilatation or surrounding inflammatory changes. Spleen: Normal in size without focal abnormality. Adrenals/Urinary Tract: Adrenal glands are unremarkable. Kidneys enhance symmetrically. Subcentimeter hypodense lesions are too small to accurately characterize. No nephrolithiasis or hydronephrosis. Bladder is unremarkable.  Stomach/Bowel: Stomach is unremarkable. Appendix is normal. No evidence of bowel obstruction or bowel wall thickening. Moderate colonic stool burden throughout the colon. Small focal areas of 2-3 mm enhancement in the terminal ileum likely reflect small vascular malformations (series 5, image 50-51). Vascular/Lymphatic: No significant vascular findings are present. No enlarged abdominal or pelvic lymph nodes. Reproductive: Uterus is present. There are 2 cystic structures in the RIGHT adnexa. They span 3.5 x 3.2 cm and are consistent with simple ovarian cysts. Other: No abdominal wall hernia or abnormality. No abdominopelvic ascites. Musculoskeletal: No acute or  significant osseous findings. IMPRESSION: 1. No CT evidence of pyelonephritis. 2. There are 2 adjacent ovarian cysts in the RIGHT ovary which span 3.5 cm in total. If concern for ovarian torsion, recommend dedicated pelvic ultrasound. Electronically Signed   By: Meda Klinefelter MD   On: 12/02/2020 14:38    Procedures Procedures   Medications Ordered in ED Medications  cefTRIAXone (ROCEPHIN) 2 g in sodium chloride 0.9 % 100 mL IVPB (2 g Intravenous New Bag/Given 12/02/20 1351)  ketorolac (TORADOL) 30 MG/ML injection 30 mg (30 mg Intravenous Given 12/02/20 1349)  iohexol (OMNIPAQUE) 300 MG/ML solution 80 mL (80 mLs Intravenous Contrast Given 12/02/20 1409)    ED Course  I have reviewed the triage vital signs and the nursing notes.  Pertinent labs & imaging results that were available during my care of the patient were reviewed by me and considered in my medical decision making (see chart for details).  Clinical Course as of 12/02/20 1529  Sat Dec 02, 2020  1251 WBC(!): 13.6 Improved from previous 5 months ago [MV]    Clinical Course User Index [MV] Tanda Rockers, [place]   MDM Rules/Calculators/[name]                          40 year old female presents to the ED today with complaint of right flank pain for the past week.  Diagnosed with a  UTI 1 month ago and treated with Macrobid, completed course however still continues to have urinary frequency.  Also having nausea.  History of pyelonephritis.  On arrival to the ED vitals are stable.  Patient is afebrile, nontachycardic and nontachypneic.  She does not appear to be in any acute distress.  On exam she has obvious right CVA tenderness palpation however also diffuse abdominal tenderness palpation, worse in the left lower quadrant. No previous abdominal surgeries. DDx includes UTI, pyelo, kidney stones, appendicitis, other intraabdominal infection. Will plan to work up with labs including CBC, CMP, lipase, U/A, urine pregnancy. May consider CT scan if labs are abnormal.   CBC with leukocytosis today at 13,600. No left shift. Improved from previous 5 months ago when she was admitted for pyelo  WBC  Date Value Ref Range Status  12/02/2020 13.6 (H) 4.0 - 10.5 K/uL Final  06/24/2020 16.5 (H) 4.0 - 10.5 K/uL Final  06/20/2020 16.2 (H) 4.0 - 10.5 K/uL Final  03/29/2020 15.3 (H) 3.4 - 10.8 x10E3/uL Final  01/24/2020 18.8 (H) 3.4 - 10.8 x10E3/uL Final  12/22/2019 15.3 (H) 3.4 - 10.8 x10E3/uL Final  11/25/2019 18.1 (H) 4.0 - 10.5 K/uL Final   CMP without electrolyte abnormalities.  Lipase 32.  U/A positive for nitrites, leuks, > 50 WBCs with many bacteria and budding yeast. Pt denies vaginal discharge at this time. Given right flank pain and UTI will obtain CT scan today. Urine culture sent. Rocephin provided.   CT negative for pyelo or appendicitis at this time. Pt does have ovarian cysts on CT scan; recommends ultrasound if any concern for torsion. Pt's pain has been present for approximately 1 week and she is not septic appearing at this time; wbc count slightly elevated however improved from previous. Doubt torsion at this time as I would suspect worsening condition of pt with 1 week of torsion. Given her U/A being positive for UTI at this time will treat for same. Pt to follow up with her  OBGYN regarding her cysts.   Per last urine culture pt grew > 100,000 E  coli with susceptibility to cefazolin. Will treat with keflex at this time and have pt follow up with PCP for same. She is in agreement with plan and stable for discharge home.   This note was prepared using Dragon voice recognition software and may include unintentional dictation errors due to the inherent limitations of voice recognition software.   Final Clinical Impression(s) / ED Diagnoses Final diagnoses:  Acute cystitis without hematuria  Cyst of right ovary    Rx / DC Orders ED Discharge Orders         Ordered    cephALEXin (KEFLEX) 500 MG capsule  4 times daily        12/02/20 1527           Discharge Instructions     Your urine shows infection today. Please pick up antibiotics and take as prescribed. We have sent your urine for culture and will call in 2-3 days time if the antibiotic needs to be changed.   Continue taking Ibuprofen and Tylenol as needed for pain.   Your CT scan showed an ovarian cyst on the right side; please follow up with your OBGYN regarding this. They can also recheck your urine in 1 week to make sure the infection has cleared.   Return to the ED IMMEDIATELY for any worsening symptoms including worsening pain, persistent vomiting, fevers > 100.4, passing out, or any other new/concerning symptoms.        Tanda Rockers, PA-C 12/02/20 1529    Vanetta Mulders, MD 12/03/20 [phone]

## 2020-12-02 NOTE — ED Notes (Signed)
Pt lying in bed at this time awaiting ct scan. Pt states her pain is a 8 in her right flank area that radiates across her abdomen. Pain medication given per orders. Warm blanket provided for patient. Will continue to monitor. Bed lowered and locked with call bell in reach.

## 2020-12-05 ENCOUNTER — Ambulatory Visit (INDEPENDENT_AMBULATORY_CARE_PROVIDER_SITE_OTHER): Payer: Medicaid Other | Admitting: *Deleted

## 2020-12-05 ENCOUNTER — Other Ambulatory Visit: Payer: Self-pay

## 2020-12-05 DIAGNOSIS — Z308 Encounter for other contraceptive management: Secondary | ICD-10-CM | POA: Diagnosis not present

## 2020-12-05 LAB — URINE CULTURE: Culture: >=100000 — AB

## 2020-12-05 MED ORDER — MEDROXYPROGESTERONE ACETATE 150 MG/ML IM SUSP
150.0000 mg | Freq: Once | INTRAMUSCULAR | Status: AC
  Administered 2020-12-05: 150 mg via INTRAMUSCULAR

## 2020-12-05 NOTE — Progress Notes (Signed)
   NURSE VISIT- INJECTION  SUBJECTIVE:  Rachel Orr is a 40 y.o. [id number] female here for a Depo Provera for contraception/period management. She is a GYN patient.   OBJECTIVE:  There were no vitals taken for this visit.  Appears well, in no apparent distress  Injection administered in: Right deltoid  Meds ordered this encounter  Medications  . medroxyPROGESTERone (DEPO-PROVERA) injection 150 mg    ASSESSMENT: GYN patient Depo Provera for contraception/period management PLAN: Follow-up: in 11-13 weeks for next Depo   [name]  12/05/2020 2:56 PM

## 2021-01-17 ENCOUNTER — Other Ambulatory Visit: Payer: Self-pay

## 2021-01-17 ENCOUNTER — Other Ambulatory Visit (HOSPITAL_COMMUNITY)
Admission: RE | Admit: 2021-01-17 | Discharge: 2021-01-17 | Disposition: A | Discharge status: Home / Self Care | Payer: Medicaid Other | Source: Ambulatory Visit | Attending: Obstetrics & Gynecology | Admitting: Obstetrics & Gynecology

## 2021-01-17 ENCOUNTER — Other Ambulatory Visit (INDEPENDENT_AMBULATORY_CARE_PROVIDER_SITE_OTHER): Payer: Medicaid Other | Admitting: *Deleted

## 2021-01-17 DIAGNOSIS — Z113 Encounter for screening for infections with a predominantly sexual mode of transmission: Secondary | ICD-10-CM | POA: Diagnosis present

## 2021-01-17 NOTE — Progress Notes (Signed)
   NURSE VISIT- VAGINITIS/STD/POC  SUBJECTIVE:  Rachel Orr is a 40 y.o. [id number] GYN patientfemale here for a vaginal swab for STD screen.  She reports the following symptoms: none for 0 days. Denies abnormal vaginal bleeding, significant pelvic pain, fever, or UTI symptoms.  OBJECTIVE:  There were no vitals taken for this visit.  Appears well, in no apparent distress  ASSESSMENT: Vaginal swab for STD screen  PLAN: Self-collected vaginal probe for Gonorrhea, Chlamydia, Trichomonas, Bacterial Vaginosis, Yeast sent to lab Treatment: to be determined once results are received Follow-up as needed if symptoms persist/worsen, or new symptoms develop  [name]  01/17/2021 2:37 PM

## 2021-01-17 NOTE — Progress Notes (Signed)
Chart reviewed for nurse visit. Agree with plan of care.  Adline Potter, NP 01/17/2021 3:24 PM

## 2021-01-18 ENCOUNTER — Telehealth: Payer: Self-pay

## 2021-01-18 LAB — HEPATITIS B SURFACE ANTIGEN: Hepatitis B Surface Ag: NEGATIVE

## 2021-01-18 LAB — RPR: RPR Ser Ql: NONREACTIVE

## 2021-01-18 LAB — HIV ANTIBODY (ROUTINE TESTING W REFLEX): HIV Screen 4th Generation wRfx: NONREACTIVE

## 2021-01-18 NOTE — Telephone Encounter (Signed)
Returned pt's call. Informed her of lab results from blood work. Still waiting for swab results. Pt confirmed understanding.

## 2021-01-18 NOTE — Telephone Encounter (Signed)
Pt calling for lab results done on 01/17/21.

## 2021-01-19 ENCOUNTER — Telehealth: Payer: Self-pay | Admitting: Women's Health

## 2021-01-19 ENCOUNTER — Other Ambulatory Visit: Payer: Self-pay | Admitting: Adult Health

## 2021-01-19 LAB — CERVICOVAGINAL ANCILLARY ONLY
Bacterial Vaginitis (gardnerella): POSITIVE — AB
Candida Glabrata: NEGATIVE
Candida Vaginitis: NEGATIVE
Chlamydia: NEGATIVE
Comment: NEGATIVE
Comment: NEGATIVE
Comment: NEGATIVE
Comment: NEGATIVE
Comment: NEGATIVE
Comment: NORMAL
Neisseria Gonorrhea: NEGATIVE
Trichomonas: NEGATIVE

## 2021-01-19 MED ORDER — METRONIDAZOLE 500 MG PO TABS: 500.0000 mg | ORAL_TABLET | Freq: Two times a day (BID) | ORAL | 0 refills | Status: DC

## 2021-01-19 NOTE — Progress Notes (Signed)
+  BV on vaginal swab will  rx flagyl,no sex or alcohol during treatment  °

## 2021-01-19 NOTE — Telephone Encounter (Signed)
Patient called stating that she had a self swab done not to long ago and she would like to know the results. Please contact pt

## 2021-01-30 ENCOUNTER — Other Ambulatory Visit: Payer: Self-pay | Admitting: Adult Health

## 2021-01-30 MED ORDER — METRONIDAZOLE 0.75 % VA GEL: 1.0000 | Freq: Every day | VAGINAL | 0 refills | Status: DC

## 2021-01-30 NOTE — Progress Notes (Signed)
metrogel sent to drug store

## 2021-02-20 ENCOUNTER — Other Ambulatory Visit: Payer: Self-pay

## 2021-02-20 ENCOUNTER — Ambulatory Visit (INDEPENDENT_AMBULATORY_CARE_PROVIDER_SITE_OTHER): Payer: Medicaid Other | Admitting: *Deleted

## 2021-02-20 DIAGNOSIS — Z3042 Encounter for surveillance of injectable contraceptive: Secondary | ICD-10-CM | POA: Diagnosis not present

## 2021-02-20 MED ORDER — MEDROXYPROGESTERONE ACETATE 150 MG/ML IM SUSP
150.0000 mg | Freq: Once | INTRAMUSCULAR | Status: AC
  Administered 2021-02-20: 150 mg via INTRAMUSCULAR

## 2021-02-20 NOTE — Progress Notes (Signed)
   NURSE VISIT- INJECTION  SUBJECTIVE:  Rachel Orr is a 40 y.o. [id number] female here for a Depo Provera for contraception/period management. She is a GYN patient.   OBJECTIVE:  There were no vitals taken for this visit.  Appears well, in no apparent distress  Injection administered in: Left deltoid  Meds ordered this encounter  Medications  . medroxyPROGESTERone (DEPO-PROVERA) injection 150 mg    ASSESSMENT: GYN patient Depo Provera for contraception/period management PLAN: Follow-up: in 11-13 weeks for next Depo   Jobe Marker  02/20/2021 4:16 PM

## 2021-05-08 ENCOUNTER — Ambulatory Visit: Payer: Medicaid Other

## 2021-05-16 ENCOUNTER — Other Ambulatory Visit: Payer: Self-pay

## 2021-05-16 ENCOUNTER — Ambulatory Visit (INDEPENDENT_AMBULATORY_CARE_PROVIDER_SITE_OTHER): Payer: Medicaid Other | Admitting: *Deleted

## 2021-05-16 DIAGNOSIS — R300 Dysuria: Secondary | ICD-10-CM

## 2021-05-16 DIAGNOSIS — Z308 Encounter for other contraceptive management: Secondary | ICD-10-CM | POA: Diagnosis not present

## 2021-05-16 LAB — POCT URINALYSIS DIPSTICK
Glucose, UA: NEGATIVE
Ketones, UA: NEGATIVE
Nitrite, UA: NEGATIVE
Protein, UA: NEGATIVE

## 2021-05-16 MED ORDER — MEDROXYPROGESTERONE ACETATE 150 MG/ML IM SUSP
150.0000 mg | Freq: Once | INTRAMUSCULAR | Status: AC
  Administered 2021-05-16: 150 mg via INTRAMUSCULAR

## 2021-05-16 NOTE — Progress Notes (Signed)
   NURSE VISIT- INJECTION  SUBJECTIVE:  Rachel Orr is a 40 y.o. [id number] female here for a Depo Provera for contraception/period management. She is a GYN patient. Pt also mentioned she thought she had a UTI. Urine sent to lab for culture.   OBJECTIVE:  There were no vitals taken for this visit.  Appears well, in no apparent distress  Injection administered in: Right deltoid  Meds ordered this encounter  Medications   medroxyPROGESTERone (DEPO-PROVERA) injection 150 mg    ASSESSMENT: GYN patient Depo Provera for contraception/period management PLAN: Follow-up: in 11-13 weeks for next Depo   Malachy Mood  05/16/2021 3:07 PM  Chart reviewed for nurse visit. Agree with plan of care.  Cheral Marker, [place] 05/16/2021 3:09 PM

## 2021-05-19 LAB — URINE CULTURE

## 2021-05-21 ENCOUNTER — Other Ambulatory Visit: Payer: Self-pay | Admitting: Adult Health

## 2021-05-21 MED ORDER — SULFAMETHOXAZOLE-TRIMETHOPRIM 800-160 MG PO TABS: 1.0000 | ORAL_TABLET | Freq: Two times a day (BID) | ORAL | 0 refills | Status: DC

## 2021-05-21 NOTE — Progress Notes (Signed)
Rx septra ds for UTI 

## 2021-08-06 ENCOUNTER — Ambulatory Visit: Payer: Medicaid Other

## 2021-08-07 ENCOUNTER — Ambulatory Visit: Payer: Medicaid Other

## 2021-08-14 ENCOUNTER — Ambulatory Visit: Payer: Medicaid Other

## 2021-08-15 ENCOUNTER — Ambulatory Visit: Payer: Medicaid Other

## 2021-08-16 IMAGING — CT CT ABD-PELV W/ CM
2 of 4 series · 16 of 46 positions shown, 18 images · IV contrast (Omnipaque or Isovue)
Comparison: January 28, 2013

CLINICAL DATA: RIGHT flank pain

EXAM:
CT ABDOMEN AND PELVIS WITH CONTRAST
TECHNIQUE: Multidetector CT imaging of the abdomen and pelvis was performed
using the standard protocol following bolus administration of
intravenous contrast.
CONTRAST:  80mL OMNIPAQUE IOHEXOL 300 MG/ML  SOLN

[Series 2: axial st · axial · 0.85mm/px · z∈[+660,+1110]mm · 13 of 100 slices shown, 15 images]
[im 5/100  soft-tissue]
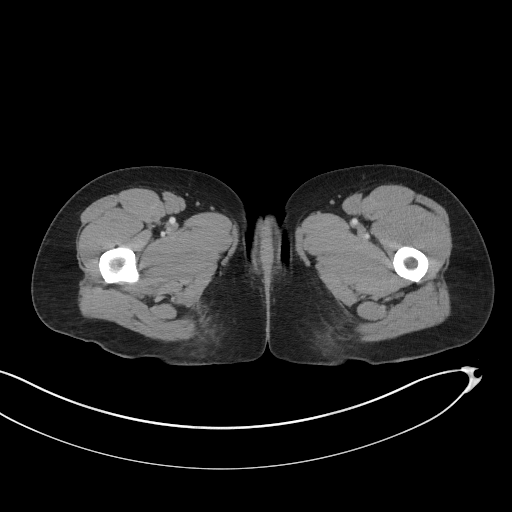
[im 5/100  bone]
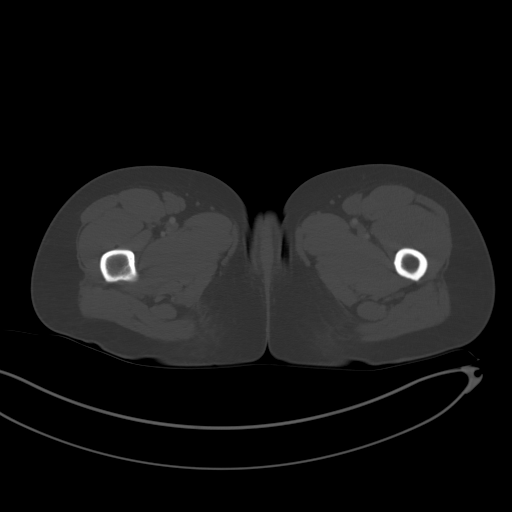
[im 13/100  soft-tissue]
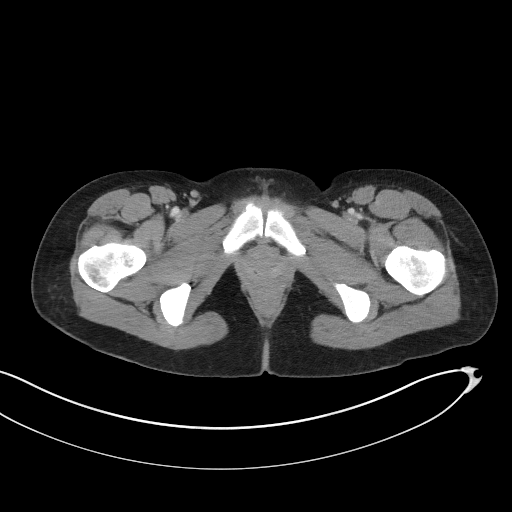
[im 21/100  soft-tissue]
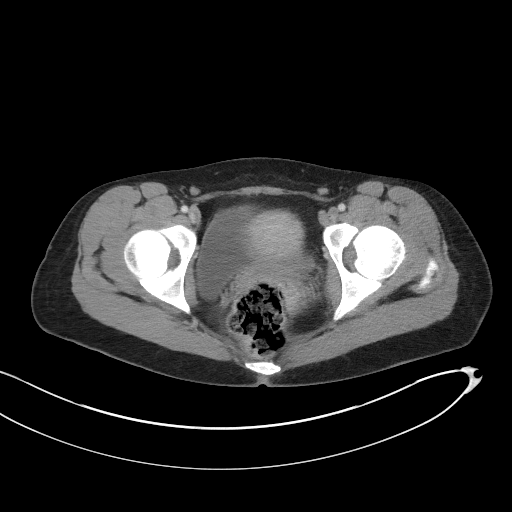
[im 29/100  soft-tissue]
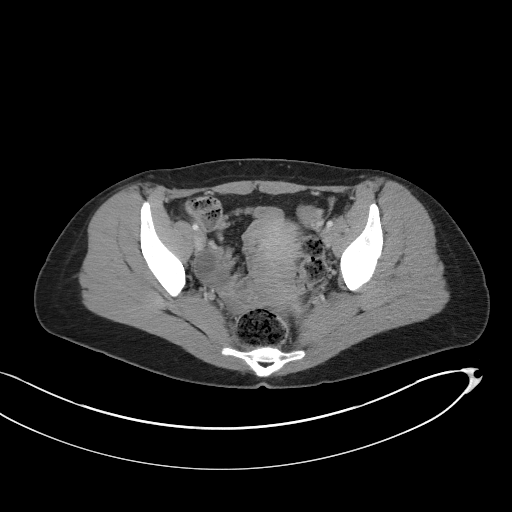
[im 34/100  soft-tissue]
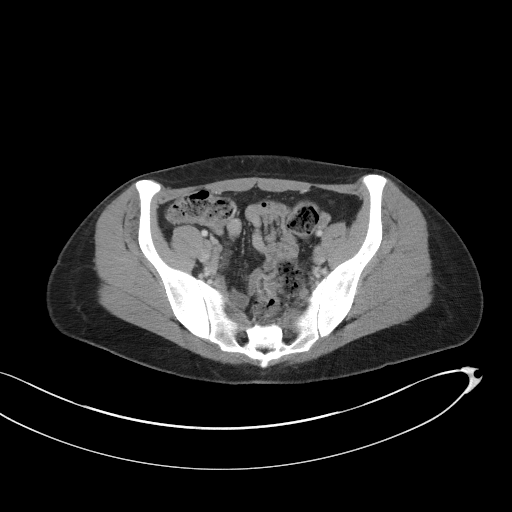
[im 42/100  soft-tissue]
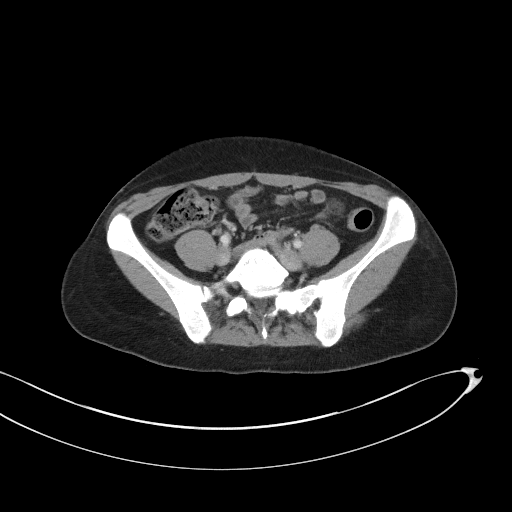
[im 50/100  soft-tissue]
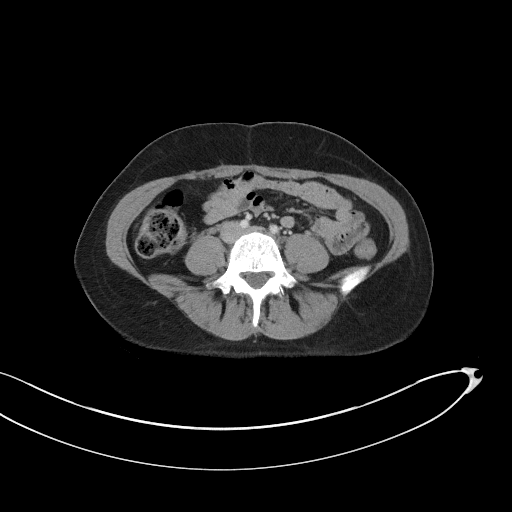
[im 58/100  soft-tissue]
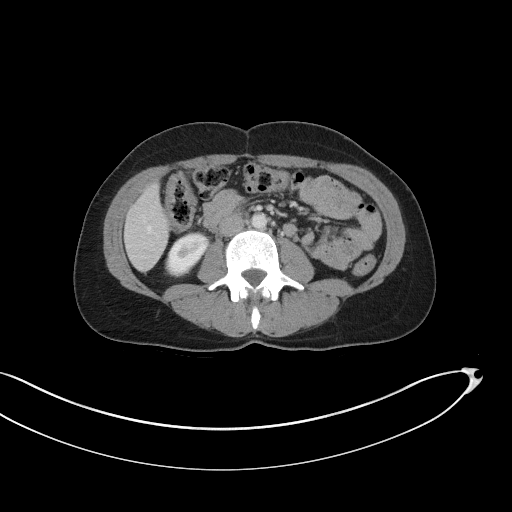
[im 67/100  soft-tissue]
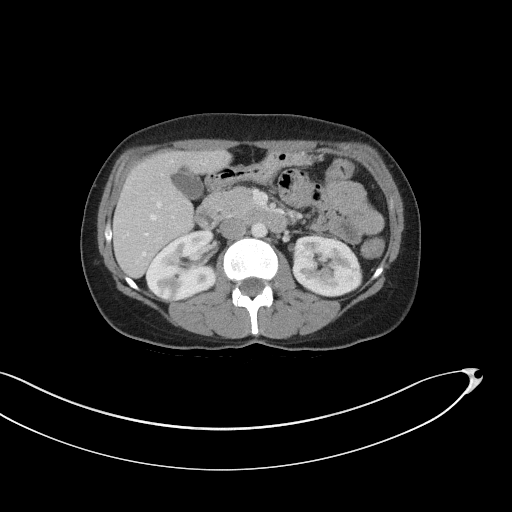
[im 67/100  bone]
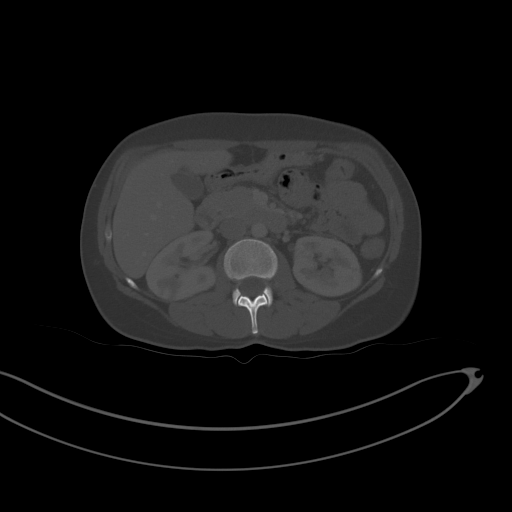
[im 71/100  soft-tissue]
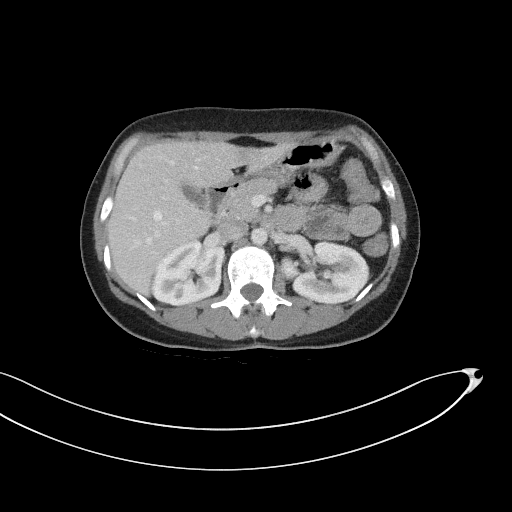
[im 79/100  soft-tissue]
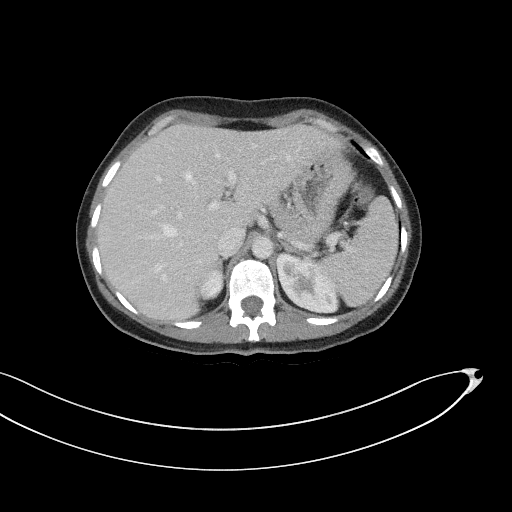
[im 87/100  soft-tissue]
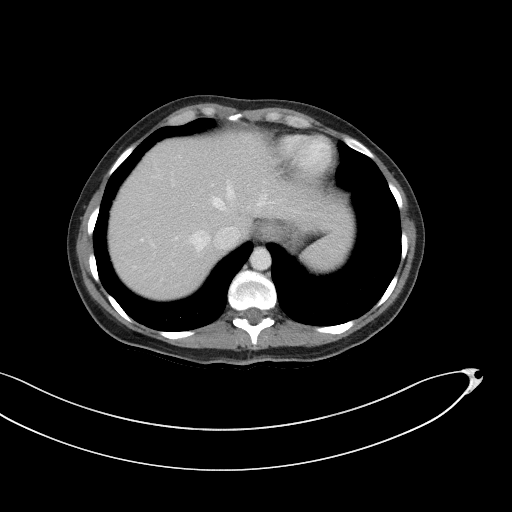
[im 95/100  soft-tissue]
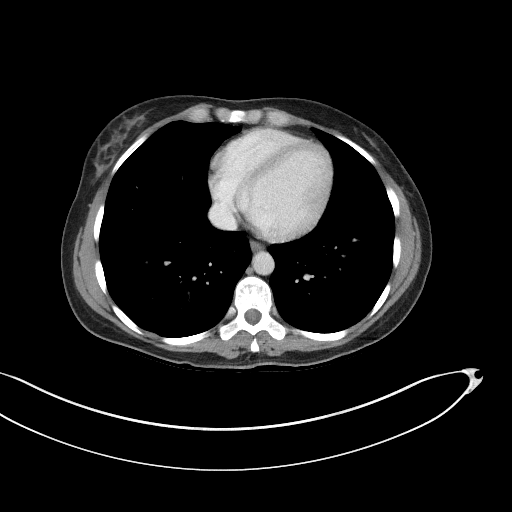

[Series 5: coronal st · coronal · 0.71mm/px · 3 of 116 slices shown]
[im 39/116  soft-tissue]
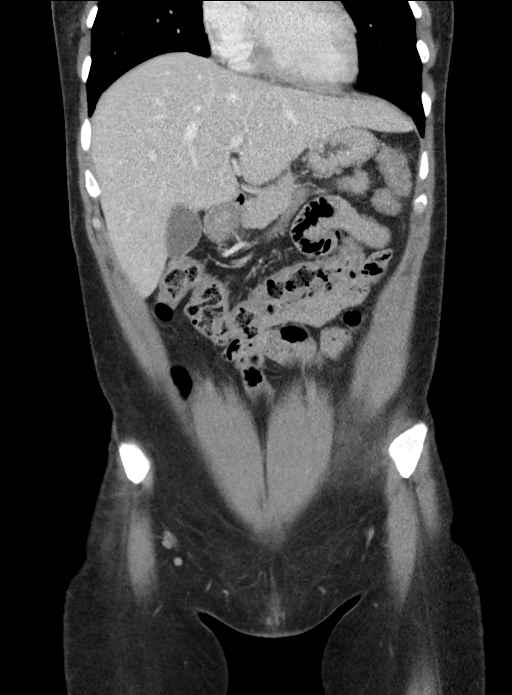
[im 52/116  soft-tissue]
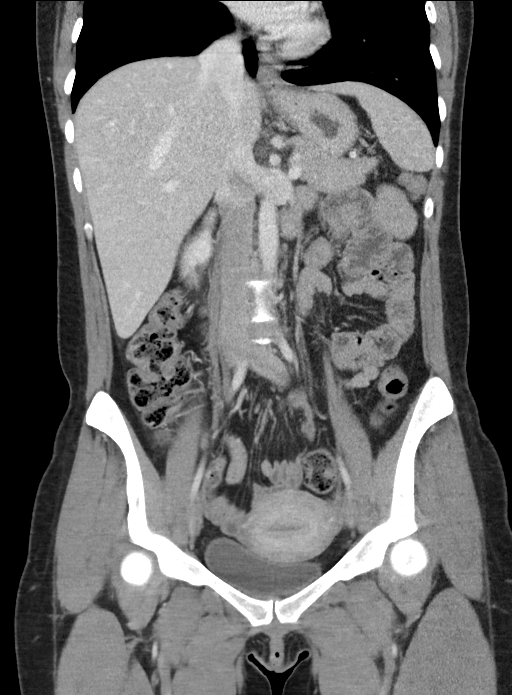
[im 64/116  soft-tissue]
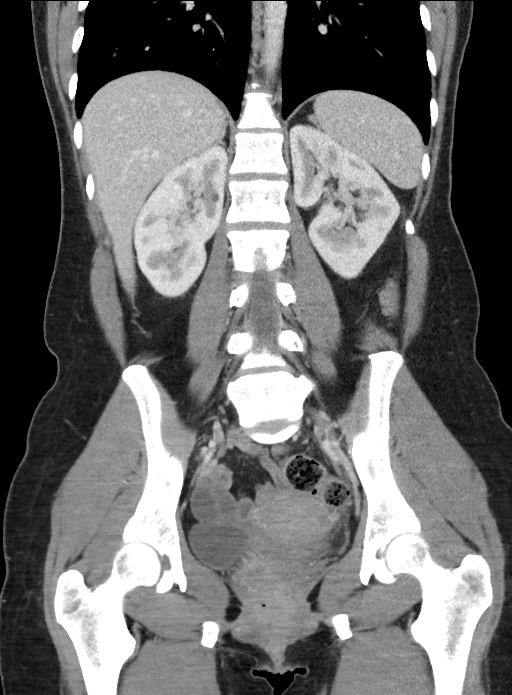

[16 of 46 positions shown; findings below may reference images not displayed]

FINDINGS: Lower chest: No acute abnormality.

Hepatobiliary: Focal fatty deposition adjacent to the falciform
ligament. Gallbladder is unremarkable. Portal veins are patent. No
intrahepatic or extrahepatic biliary ductal dilation.

Pancreas: Unremarkable. No pancreatic ductal dilatation or
surrounding inflammatory changes.

Spleen: Normal in size without focal abnormality.

Adrenals/Urinary Tract: Adrenal glands are unremarkable. Kidneys
enhance symmetrically. Subcentimeter hypodense lesions are too small
to accurately characterize. No nephrolithiasis or hydronephrosis.
Bladder is unremarkable.

Stomach/Bowel: Stomach is unremarkable. Appendix is normal. No
evidence of bowel obstruction or bowel wall thickening. Moderate
colonic stool burden throughout the colon. Small focal areas of 2-3
mm enhancement in the terminal ileum likely reflect small vascular
malformations (series 5, image 50-51).

Vascular/Lymphatic: No significant vascular findings are present. No
enlarged abdominal or pelvic lymph nodes.

Reproductive: Uterus is present. There are 2 cystic structures in
the RIGHT adnexa. They span 3.5 x 3.2 cm and are consistent with
simple ovarian cysts.

Other: No abdominal wall hernia or abnormality. No abdominopelvic
ascites.

Musculoskeletal: No acute or significant osseous findings.
IMPRESSION: 1. No CT evidence of pyelonephritis.
2. There are 2 adjacent ovarian cysts in the RIGHT ovary which span
3.5 cm in total. If concern for ovarian torsion, recommend dedicated
pelvic ultrasound.

## 2021-08-27 ENCOUNTER — Other Ambulatory Visit: Payer: Self-pay | Admitting: Women's Health

## 2021-08-27 ENCOUNTER — Other Ambulatory Visit: Payer: Self-pay | Admitting: Obstetrics & Gynecology

## 2021-08-27 ENCOUNTER — Telehealth: Payer: Self-pay | Admitting: Women's Health

## 2021-08-27 MED ORDER — MEDROXYPROGESTERONE ACETATE 150 MG/ML IM SUSP: 150.0000 mg | INTRAMUSCULAR | 0 refills | Status: DC

## 2021-08-27 NOTE — Telephone Encounter (Signed)
Patient made appointment for depo restart tomorrow and she called [facility] to refill her depo and she doesn't have any refills. She uses [facility] in [place].

## 2021-08-28 ENCOUNTER — Ambulatory Visit: Payer: Medicaid Other

## 2021-09-05 ENCOUNTER — Other Ambulatory Visit (INDEPENDENT_AMBULATORY_CARE_PROVIDER_SITE_OTHER): Payer: Medicaid Other | Admitting: *Deleted

## 2021-09-05 ENCOUNTER — Other Ambulatory Visit: Payer: Self-pay

## 2021-09-05 DIAGNOSIS — Z3042 Encounter for surveillance of injectable contraceptive: Secondary | ICD-10-CM

## 2021-09-05 DIAGNOSIS — Z3202 Encounter for pregnancy test, result negative: Secondary | ICD-10-CM | POA: Diagnosis not present

## 2021-09-05 LAB — POCT URINE PREGNANCY: Preg Test, Ur: NEGATIVE

## 2021-09-05 MED ORDER — MEDROXYPROGESTERONE ACETATE 150 MG/ML IM SUSP
150.0000 mg | Freq: Once | INTRAMUSCULAR | Status: AC
  Administered 2021-09-05: 150 mg via INTRAMUSCULAR

## 2021-09-05 NOTE — Progress Notes (Signed)
   NURSE VISIT- INJECTION  SUBJECTIVE:  Rachel Orr is a 40 y.o. [id number] female here for a Depo Provera for contraception/period management. She is a GYN patient.   OBJECTIVE:  There were no vitals taken for this visit.  Appears well, in no apparent distress  Injection administered in: Left deltoid  Meds ordered this encounter  Medications   medroxyPROGESTERone (DEPO-PROVERA) injection 150 mg    ASSESSMENT: GYN patient Depo Provera for contraception/period management PLAN: Follow-up: in 11-13 weeks for next Depo   Pt was late for depo. Reviewed with Cyril Mourning. Pt currently on her period and has a negative preg test. Okay to give per Victorino Dike.   [name]  09/05/2021 9:30 AM

## 2021-11-27 ENCOUNTER — Ambulatory Visit: Payer: Medicaid Other

## 2021-11-28 ENCOUNTER — Other Ambulatory Visit: Payer: Self-pay

## 2021-11-28 ENCOUNTER — Ambulatory Visit (INDEPENDENT_AMBULATORY_CARE_PROVIDER_SITE_OTHER): Payer: Medicaid Other | Admitting: *Deleted

## 2021-11-28 DIAGNOSIS — Z3042 Encounter for surveillance of injectable contraceptive: Secondary | ICD-10-CM

## 2021-11-28 MED ORDER — MEDROXYPROGESTERONE ACETATE 150 MG/ML IM SUSP
150.0000 mg | Freq: Once | INTRAMUSCULAR | Status: AC
  Administered 2021-11-28: 150 mg via INTRAMUSCULAR

## 2021-11-28 NOTE — Progress Notes (Signed)
° °  NURSE VISIT- INJECTION  SUBJECTIVE:  Rachel Orr is a 41 y.o. [id number] female here for a Depo Provera for contraception/period management. She is a GYN patient.   OBJECTIVE:  There were no vitals taken for this visit.  Appears well, in no apparent distress  Injection administered in: Left deltoid  Meds ordered this encounter  Medications   medroxyPROGESTERone (DEPO-PROVERA) injection 150 mg    ASSESSMENT: GYN patient Depo Provera for contraception/period management PLAN: Follow-up: in 11-13 weeks for next Depo   [name]  11/28/2021 3:09 PM

## 2022-02-18 ENCOUNTER — Other Ambulatory Visit (HOSPITAL_COMMUNITY)
Admission: RE | Admit: 2022-02-18 | Discharge: 2022-02-18 | Disposition: A | Discharge status: Home / Self Care | Payer: Medicaid Other | Source: Ambulatory Visit | Attending: Adult Health | Admitting: Adult Health

## 2022-02-18 ENCOUNTER — Ambulatory Visit (INDEPENDENT_AMBULATORY_CARE_PROVIDER_SITE_OTHER): Payer: Medicaid Other | Admitting: Adult Health

## 2022-02-18 ENCOUNTER — Encounter: Payer: Self-pay | Admitting: Adult Health

## 2022-02-18 VITALS — BP 126/81 | HR 88 | Ht 62.5 in | Wt 138.0 lb

## 2022-02-18 DIAGNOSIS — F32A Depression, unspecified: Secondary | ICD-10-CM

## 2022-02-18 DIAGNOSIS — L309 Dermatitis, unspecified: Secondary | ICD-10-CM | POA: Insufficient documentation

## 2022-02-18 DIAGNOSIS — Z3042 Encounter for surveillance of injectable contraceptive: Secondary | ICD-10-CM

## 2022-02-18 DIAGNOSIS — G5603 Carpal tunnel syndrome, bilateral upper limbs: Secondary | ICD-10-CM

## 2022-02-18 DIAGNOSIS — F419 Anxiety disorder, unspecified: Secondary | ICD-10-CM | POA: Diagnosis not present

## 2022-02-18 DIAGNOSIS — Z113 Encounter for screening for infections with a predominantly sexual mode of transmission: Secondary | ICD-10-CM | POA: Insufficient documentation

## 2022-02-18 DIAGNOSIS — Z0000 Encounter for general adult medical examination without abnormal findings: Secondary | ICD-10-CM | POA: Diagnosis not present

## 2022-02-18 DIAGNOSIS — Z01419 Encounter for gynecological examination (general) (routine) without abnormal findings: Secondary | ICD-10-CM

## 2022-02-18 DIAGNOSIS — M25531 Pain in right wrist: Secondary | ICD-10-CM

## 2022-02-18 DIAGNOSIS — M25532 Pain in left wrist: Secondary | ICD-10-CM

## 2022-02-18 MED ORDER — MEDROXYPROGESTERONE ACETATE 150 MG/ML IM SUSP: 150.0000 mg | INTRAMUSCULAR | 4 refills | Status: DC

## 2022-02-18 MED ORDER — HYDROXYZINE HCL 10 MG PO TABS: 10.0000 mg | ORAL_TABLET | Freq: Three times a day (TID) | ORAL | 3 refills | Status: DC | PRN

## 2022-02-18 MED ORDER — MEDROXYPROGESTERONE ACETATE 150 MG/ML IM SUSP
150.0000 mg | Freq: Once | INTRAMUSCULAR | Status: AC
  Administered 2022-02-18: 150 mg via INTRAMUSCULAR

## 2022-02-18 MED ORDER — ESCITALOPRAM OXALATE 10 MG PO TABS: 10.0000 mg | ORAL_TABLET | Freq: Every day | ORAL | 2 refills | Status: DC

## 2022-02-18 MED ORDER — MOMETASONE FUROATE 0.1 % EX CREA: 1.0000 "application " | TOPICAL_CREAM | Freq: Every day | CUTANEOUS | 0 refills | Status: DC

## 2022-02-18 NOTE — Progress Notes (Signed)
Patient ID: Rachel Orr, female   DOB: December 22, 1980, 41 y.o.   MRN: [id number] ?History of Present Illness: ?Rachel Orr is a 41 year old white female,single, G5P5005, in for well woman gyn exam and depo. 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  ?She has times when feels like heart beating fast and will be good then angry then good. ?She received depo injection in office today. ? ?Lab Results  ?Component Value Date  ? DIAGPAP  03/01/2020  ?  - Negative for intraepithelial lesion or malignancy (NILM)  ? [place] Negative 03/01/2020  ?  ? ?Current Medications, Allergies, Past Medical History, Past Surgical History, Family History and Social History were reviewed in [employer] record.   ? ? ?Review of Systems: ?Patient denies any headaches, hearing loss, fatigue, blurred vision, shortness of breath, chest pain, abdominal pain, problems with bowel movements, urination, or intercourse. No joint pain or mood swings.  ?See HPI for positives. ? ? ?Physical Exam:BP 126/81 (BP Location: Left Arm, Patient Position: Sitting, Cuff Size: Normal)   Pulse 88   Ht 5' 2.5" (1.588 m)   Wt 138 lb (62.6 kg)   Breastfeeding No   BMI 24.84 kg/m?   ?General:  Well developed, well nourished, no acute distress ?Skin:  Warm and dry, has scaly areas on back and arms and legs ?Neck:  Midline trachea, normal thyroid, good ROM, no lymphadenopathy ?Lungs; Clear to auscultation bilaterally ?Breast:  No dominant palpable mass, retraction, or nipple discharge ?Cardiovascular: Regular rate and rhythm ?Abdomen:  Soft, non tender, no hepatosplenomegaly ?Pelvic:  External genitalia is normal in appearance, no lesions.  The vagina is normal in appearance,+period blood. Urethra has no lesions or masses. The cervix is bulbous.CV swab obtained.   Uterus is felt to be normal size, shape, and contour.  No adnexal masses or tenderness  noted.Bladder is non tender, no masses felt. ?Extremities/musculoskeletal:  No swelling or varicosities noted, no clubbing or cyanosis has dry scaly patches, and scabs ?+phalen's sign ?Psych: Alert and cooperative,seems happy ?AA is 0 ?Fall risk is low ? ?  02/18/2022  ?  3:35 PM 03/29/2020  ?  9:55 AM 12/22/2019  ?  3:29 PM  ?Depression screen PHQ 2/9  ?Decreased Interest 3 3 0  ?Down, Depressed, Hopeless 2 3 0  ?PHQ - 2 Score 5 6 0  ?Altered sleeping 0 3 1  ?Tired, decreased energy 3 3 0  ?Change in appetite 0 3 2  ?Feeling bad or failure about yourself  0 3 0  ?Trouble concentrating 3 3 0  ?Moving slowly or fidgety/restless 3 3 0  ?Suicidal thoughts 0 3 0  ?PHQ-9 Score 14 27 3   ?Difficult doing work/chores   Not difficult at all  ?  ? ?  02/18/2022  ?  3:35 PM 03/29/2020  ?  9:55 AM  ?GAD 7 : Generalized Anxiety Score  ?Nervous, Anxious, on Edge 3 3  ?Control/stop worrying 3 3  ?Worry too much - different things 3 3  ?Trouble relaxing 3 3  ?Restless 3 3  ?Easily annoyed or irritable 3 3  ?Afraid - awful might happen 0 3  ?Total GAD 7 Score 18 21  ? ? Upstream - 02/18/22 1533   ? ?  ? Pregnancy Intention Screening  ? Does the patient want to become pregnant in the next year? No   ?  Does the patient's partner want to become pregnant in the next year? No   ? Would the patient like to discuss contraceptive options today? No   ?  ? Contraception Wrap Up  ? Current Method Hormonal Injection   ? End Method Hormonal Injection   ? ?  ?  ? ?  ?  ? Examination chaperoned by Levy Pupa LPN ? ?Impression and Plan: ?1. Encounter for surveillance of injectable contraceptive ?She received depo today ?Will refill depo ? ?2. Encounter for well woman exam with routine gynecological exam ?Pap and physical in 1 year  ? ?3. Anxiety and depression ?Will rx lexapro 10 mg 1 daily ?Will rx vistaril 10 mg prn ?Meds ordered this encounter  ?Medications  ? medroxyPROGESTERone (DEPO-PROVERA) injection 150 mg  ? escitalopram (LEXAPRO) 10 MG tablet   ?  Sig: Take 1 tablet (10 mg total) by mouth daily.  ?  Dispense:  30 tablet  ?  Refill:  2  ?  Order Specific Question:   Supervising Provider  ?  Answer:   Tania Ade H [[id number]]  ? medroxyPROGESTERone (DEPO-PROVERA) 150 MG/ML injection  ?  Sig: Inject 1 mL (150 mg total) into the muscle every 3 (three) months.  ?  Dispense:  1 mL  ?  Refill:  4  ?  Order Specific Question:   Supervising Provider  ?  Answer:   Tania Ade H [[id number]]  ? hydrOXYzine (ATARAX) 10 MG tablet  ?  Sig: Take 1 tablet (10 mg total) by mouth 3 (three) times daily as needed.  ?  Dispense:  30 tablet  ?  Refill:  3  ?  Order Specific Question:   Supervising Provider  ?  Answer:   Tania Ade H [[id number]]  ? mometasone (ELOCON) 0.1 % cream  ?  Sig: Apply 1 application. topically daily.  ?  Dispense:  45 g  ?  Refill:  0  ?  Order Specific Question:   Supervising Provider  ?  Answer:   Tania Ade H [[id number]]  ? Follow up in 6 weeks  ?Will refer to [facility] if desired,did discuss today ? ?4. Eczema, unspecified type ?Will rx elocon cream 0.1% daily ? ?5. Bilateral wrist pain ?Referred to [facility] ? ?6. Bilateral carpal tunnel syndrome ?Referred to [facility] ? ? ?7. Screening examination for STD (sexually transmitted disease) ?CV swab sent for GC/CHL and trich ? ? ? ?  ?  ?

## 2022-02-20 LAB — CERVICOVAGINAL ANCILLARY ONLY
Chlamydia: NEGATIVE
Comment: NEGATIVE
Comment: NEGATIVE
Comment: NORMAL
Neisseria Gonorrhea: NEGATIVE
Trichomonas: NEGATIVE

## 2022-02-27 ENCOUNTER — Encounter: Payer: Self-pay | Admitting: Orthopedic Surgery

## 2022-02-27 ENCOUNTER — Ambulatory Visit: Payer: Medicaid Other | Admitting: Orthopedic Surgery

## 2022-04-01 ENCOUNTER — Ambulatory Visit: Payer: Medicaid Other | Admitting: Adult Health

## 2022-04-11 ENCOUNTER — Ambulatory Visit: Payer: Medicaid Other | Admitting: Adult Health

## 2022-05-13 ENCOUNTER — Ambulatory Visit: Payer: Medicaid Other

## 2022-05-21 ENCOUNTER — Ambulatory Visit: Payer: Medicaid Other

## 2022-05-29 ENCOUNTER — Ambulatory Visit: Payer: Medicaid Other

## 2022-05-30 ENCOUNTER — Ambulatory Visit: Payer: Medicaid Other

## 2022-06-04 ENCOUNTER — Ambulatory Visit (INDEPENDENT_AMBULATORY_CARE_PROVIDER_SITE_OTHER): Payer: Medicaid Other | Admitting: *Deleted

## 2022-06-04 DIAGNOSIS — Z3042 Encounter for surveillance of injectable contraceptive: Secondary | ICD-10-CM | POA: Diagnosis not present

## 2022-06-04 DIAGNOSIS — Z3202 Encounter for pregnancy test, result negative: Secondary | ICD-10-CM

## 2022-06-04 LAB — POCT URINE PREGNANCY: Preg Test, Ur: NEGATIVE

## 2022-06-04 MED ORDER — MEDROXYPROGESTERONE ACETATE 150 MG/ML IM SUSP
150.0000 mg | Freq: Once | INTRAMUSCULAR | Status: AC
  Administered 2022-06-04: 150 mg via INTRAMUSCULAR

## 2022-06-04 NOTE — Progress Notes (Signed)
   NURSE VISIT- INJECTION  SUBJECTIVE:  Rachel Orr is a 41 y.o. [id number] female here for a Depo Provera for contraception/period management. She is a GYN patient.   OBJECTIVE:  There were no vitals taken for this visit.  Appears well, in no apparent distress  Injection administered in: Left deltoid  Meds ordered this encounter  Medications   medroxyPROGESTERone (DEPO-PROVERA) injection 150 mg    ASSESSMENT: GYN patient Depo Provera for contraception/period management PLAN: Follow-up: in 11-13 weeks for next Depo   Jobe Marker  06/04/2022 10:44 AM

## 2022-08-27 ENCOUNTER — Ambulatory Visit: Payer: Medicaid Other

## 2022-08-30 ENCOUNTER — Ambulatory Visit (INDEPENDENT_AMBULATORY_CARE_PROVIDER_SITE_OTHER): Payer: Medicaid Other | Admitting: *Deleted

## 2022-08-30 ENCOUNTER — Encounter: Payer: Self-pay | Admitting: *Deleted

## 2022-08-30 DIAGNOSIS — Z3042 Encounter for surveillance of injectable contraceptive: Secondary | ICD-10-CM

## 2022-08-30 MED ORDER — MEDROXYPROGESTERONE ACETATE 150 MG/ML IM SUSP
150.0000 mg | Freq: Once | INTRAMUSCULAR | Status: AC
  Administered 2022-08-30: 150 mg via INTRAMUSCULAR

## 2022-08-30 NOTE — Progress Notes (Signed)
   NURSE VISIT- INJECTION  SUBJECTIVE:  Rachel Orr is a 41 y.o. [id number] female here for a Depo Provera for contraception/period management. She is a GYN patient.   OBJECTIVE:  There were no vitals taken for this visit.  Appears well, in no apparent distress  Injection administered in: Right deltoid  Meds ordered this encounter  Medications   medroxyPROGESTERone (DEPO-PROVERA) injection 150 mg    ASSESSMENT: GYN patient Depo Provera for contraception/period management PLAN: Follow-up: in 11-13 weeks for next Depo  Needs refill on Depo    Alice Rieger  08/30/2022 10:57 AM

## 2022-10-29 ENCOUNTER — Telehealth: Payer: Self-pay | Admitting: Adult Health

## 2022-10-29 NOTE — Telephone Encounter (Signed)
Patient has some questions about starting her period before her depo is due and if that is normal. Please advise.

## 2022-11-19 ENCOUNTER — Ambulatory Visit: Payer: Medicaid Other

## 2022-11-20 ENCOUNTER — Ambulatory Visit (INDEPENDENT_AMBULATORY_CARE_PROVIDER_SITE_OTHER): Payer: Medicaid Other | Admitting: *Deleted

## 2022-11-20 DIAGNOSIS — Z3042 Encounter for surveillance of injectable contraceptive: Secondary | ICD-10-CM | POA: Diagnosis not present

## 2022-11-20 MED ORDER — MEDROXYPROGESTERONE ACETATE 150 MG/ML IM SUSP
150.0000 mg | Freq: Once | INTRAMUSCULAR | Status: AC
  Administered 2022-11-20: 150 mg via INTRAMUSCULAR

## 2022-11-20 NOTE — Progress Notes (Signed)
   NURSE VISIT- INJECTION  SUBJECTIVE:  Rachel Orr is a 42 y.o. [id number] female here for a Depo Provera for contraception/period management. She is a GYN patient.   OBJECTIVE:  There were no vitals taken for this visit.  Appears well, in no apparent distress  Injection administered in: Left deltoid  Meds ordered this encounter  Medications   medroxyPROGESTERone (DEPO-PROVERA) injection 150 mg    ASSESSMENT: GYN patient Depo Provera for contraception/period management PLAN: Follow-up: in 11-13 weeks for next Depo   Levy Pupa  11/20/2022 12:29 PM

## 2022-12-26 ENCOUNTER — Encounter: Payer: Self-pay | Admitting: Radiology

## 2023-02-05 ENCOUNTER — Ambulatory Visit: Payer: Medicaid Other | Admitting: Adult Health

## 2023-02-12 ENCOUNTER — Ambulatory Visit (INDEPENDENT_AMBULATORY_CARE_PROVIDER_SITE_OTHER): Payer: Medicaid Other | Admitting: *Deleted

## 2023-02-12 DIAGNOSIS — Z3042 Encounter for surveillance of injectable contraceptive: Secondary | ICD-10-CM | POA: Diagnosis not present

## 2023-02-12 MED ORDER — MEDROXYPROGESTERONE ACETATE 150 MG/ML IM SUSY
150.0000 mg | PREFILLED_SYRINGE | Freq: Once | INTRAMUSCULAR | Status: AC
Start: 2023-02-12 — End: 2023-02-12
  Administered 2023-02-12: 150 mg via INTRAMUSCULAR

## 2023-02-12 NOTE — Progress Notes (Signed)
   NURSE VISIT- INJECTION  SUBJECTIVE:  Rachel Orr is a 42 y.o. [id number] female here for a Depo Provera for contraception/period management. She is a GYN patient.   OBJECTIVE:  There were no vitals taken for this visit.  Appears well, in no apparent distress  Injection administered in: Right deltoid  Meds ordered this encounter  Medications   medroxyPROGESTERone Acetate SUSY 150 mg    ASSESSMENT: GYN patient Depo Provera for contraception/period management PLAN: Follow-up: in 11-13 weeks for next Depo. Pt has a rash all over. Has been seen at Urgent Care. Meds not helping. Advised to schedule an appt for rash. Pt has no PCP.    Malachy Mood  02/12/2023 1:57 PM

## 2023-02-18 ENCOUNTER — Ambulatory Visit: Payer: Medicaid Other | Admitting: Adult Health

## 2023-02-24 ENCOUNTER — Encounter: Payer: Self-pay | Admitting: Adult Health

## 2023-02-24 ENCOUNTER — Other Ambulatory Visit (HOSPITAL_COMMUNITY)
Admission: RE | Admit: 2023-02-24 | Discharge: 2023-02-24 | Disposition: A | Discharge status: Home / Self Care | Payer: Medicaid Other | Source: Ambulatory Visit | Attending: Adult Health | Admitting: Adult Health

## 2023-02-24 ENCOUNTER — Ambulatory Visit (INDEPENDENT_AMBULATORY_CARE_PROVIDER_SITE_OTHER): Payer: Medicaid Other | Admitting: Adult Health

## 2023-02-24 VITALS — BP 116/81 | HR 96 | Ht 64.0 in | Wt 160.0 lb

## 2023-02-24 DIAGNOSIS — F32A Depression, unspecified: Secondary | ICD-10-CM

## 2023-02-24 DIAGNOSIS — Z0000 Encounter for general adult medical examination without abnormal findings: Secondary | ICD-10-CM | POA: Diagnosis present

## 2023-02-24 DIAGNOSIS — L309 Dermatitis, unspecified: Secondary | ICD-10-CM

## 2023-02-24 DIAGNOSIS — R21 Rash and other nonspecific skin eruption: Secondary | ICD-10-CM | POA: Diagnosis not present

## 2023-02-24 DIAGNOSIS — Z1211 Encounter for screening for malignant neoplasm of colon: Secondary | ICD-10-CM | POA: Diagnosis not present

## 2023-02-24 DIAGNOSIS — Z113 Encounter for screening for infections with a predominantly sexual mode of transmission: Secondary | ICD-10-CM

## 2023-02-24 DIAGNOSIS — Z3042 Encounter for surveillance of injectable contraceptive: Secondary | ICD-10-CM

## 2023-02-24 DIAGNOSIS — Z1329 Encounter for screening for other suspected endocrine disorder: Secondary | ICD-10-CM

## 2023-02-24 DIAGNOSIS — F419 Anxiety disorder, unspecified: Secondary | ICD-10-CM | POA: Diagnosis not present

## 2023-02-24 LAB — HEMOCCULT GUIAC POC 1CARD (OFFICE): Fecal Occult Blood, POC: NEGATIVE

## 2023-02-24 MED ORDER — ESCITALOPRAM OXALATE 20 MG PO TABS: 20.0000 mg | ORAL_TABLET | Freq: Every day | ORAL | 6 refills | Status: DC

## 2023-02-24 MED ORDER — MOMETASONE FUROATE 0.1 % EX SOLN: Freq: Every day | CUTANEOUS | 3 refills | Status: DC

## 2023-02-24 MED ORDER — BUSPIRONE HCL 5 MG PO TABS: 5.0000 mg | ORAL_TABLET | Freq: Three times a day (TID) | ORAL | 6 refills | Status: DC

## 2023-02-24 NOTE — Progress Notes (Signed)
Patient ID: Rachel Orr, female   DOB: 1981/01/18, 42 y.o.   MRN: [id number] History of Present Illness: Rachel Orr is a 42 year old white female, single, G5P5005 in for a well woman gyn exam and pap. She is complaining of having itchy rash on her back, trunk, arms and legs and now ears and face. Was seen at Urgent care and then ER, nothing has helped.     Current Medications, Allergies, Past Medical History, Past Surgical History, Family History and Social History were reviewed in Rachel Orr record.     Review of Systems: Patient denies any headaches, hearing loss, fatigue, blurred vision, shortness of breath, chest pain, abdominal pain, problems with bowel movements, urination, or intercourse. No joint pain or mood swings.  See HPI for positives.    Physical Exam:BP 116/81 (BP Location: Left Arm, Patient Position: Sitting, Cuff Size: Normal)   Pulse 96   Ht 5\' 4"  (1.626 m)   Wt 160 lb (72.6 kg)   BMI 27.46 kg/m   General:  Well developed, well nourished, no acute distress Skin:  Warm and dry, has scaly white to red patches over arms, legs, trunk and back and white dry skin at ears Neck:  Midline trachea, normal thyroid, good ROM, no lymphadenopathy Lungs; Clear to auscultation bilaterally Breast:  No dominant palpable mass, retraction, or nipple discharge Cardiovascular: Regular rate and rhythm Abdomen:  Soft, non tender, no hepatosplenomegaly Pelvic:  External genitalia is normal in appearance, no lesions.  The vagina is normal in appearance. Urethra has no lesions or masses. The cervix is bulbous.Pap with GC/CHL and HR HPV genotyping performed.  Uterus is felt to be normal size, shape, and contour.  No adnexal masses or tenderness noted.Bladder is non tender, no masses felt. Rectal: Good sphincter tone, no polyps, or hemorrhoids felt.  Hemoccult negative. Extremities/musculoskeletal:  No swelling or varicosities noted, no clubbing or cyanosis Psych:  No mood  changes, alert and cooperative,seems happy AA is 0 Fall risk is low    02/24/2023    3:02 PM 02/18/2022    3:35 PM 03/29/2020    9:55 AM  Depression screen PHQ 2/9  Decreased Interest 1 3 3   Down, Depressed, Hopeless 0 2 3  PHQ - 2 Score 1 5 6   Altered sleeping 1 0 3  Tired, decreased energy 0 3 3  Change in appetite 0 0 3  Feeling bad or failure about yourself  0 0 3  Trouble concentrating 1 3 3   Moving slowly or fidgety/restless 1 3 3   Suicidal thoughts 0 0 3  PHQ-9 Score 4 14 27        02/24/2023    3:02 PM 02/18/2022    3:35 PM 03/29/2020    9:55 AM  GAD 7 : Generalized Anxiety Score  Nervous, Anxious, on Edge 3 3 3   Control/stop worrying 3 3 3   Worry too much - different things 3 3 3   Trouble relaxing 3 3 3   Restless 3 3 3   Easily annoyed or irritable 3 3 3   Afraid - awful might happen 0 0 3  Total GAD 7 Score 18 18 21     Upstream - 02/24/23 1531       Pregnancy Intention Screening   Does the patient want to become pregnant in the next year? No    Does the patient's partner want to become pregnant in the next year? No    Would the patient like to discuss contraceptive options today? No  Contraception Wrap Up   Current Method Hormonal Injection    End Method Hormonal Injection              Examination chaperoned by Malachy Mood LPN  Impression and Plan: 1. Routine general medical examination at a health care facility Pap sent Pap in 3 years if normal Physical in 1 year  - Cytology - PAP( [place]) - CBC w/Diff - Comprehensive metabolic panel  2. Anxiety and depression Will increase lexapro 20 mg 1 daily and add Buspar 5 mg 1 tid  Will talk in about 8 weeks to see if feeling better  3. Encounter for surveillance of injectable contraceptive She is on depo  4. Eczema, unspecified type Will rx elocon lotion  Will recheck in 2 weeks  Meds ordered this encounter  Medications   mometasone (ELOCON) 0.1 % lotion    Sig: Apply topically daily.     Dispense:  60 mL    Refill:  3    Order Specific Question:   Supervising Provider    Answer:   Despina Hidden, LUTHER H [[id number]]   escitalopram (LEXAPRO) 20 MG tablet    Sig: Take 1 tablet (20 mg total) by mouth daily.    Dispense:  30 tablet    Refill:  6    Order Specific Question:   Supervising Provider    Answer:   Despina Hidden, LUTHER H [[id number]]   busPIRone (BUSPAR) 5 MG tablet    Sig: Take 1 tablet (5 mg total) by mouth 3 (three) times daily.    Dispense:  90 tablet    Refill:  6    Order Specific Question:   Supervising Provider    Answer:   Lazaro Arms [[id number]]   Will refer to dermatology  - Ambulatory referral to Dermatology  5. Rash - CBC w/Diff - Ambulatory referral to Dermatology  6. Screening examination for STD (sexually transmitted disease) Will check Labs  - HIV Antibody (routine testing w rflx) - RPR - Hepatitis B surface antigen  7. Screening for thyroid disorder - TSH  8. Encounter for screening fecal occult blood testing Hemoccult was negative  - POCT occult blood stool

## 2023-02-25 LAB — COMPREHENSIVE METABOLIC PANEL
ALT: 12 IU/L (ref 0–32)
AST: 18 IU/L (ref 0–40)
Albumin/Globulin Ratio: 1.5 (ref 1.2–2.2)
Albumin: 4.3 g/dL (ref 3.9–4.9)
Alkaline Phosphatase: 106 IU/L (ref 44–121)
BUN/Creatinine Ratio: 14 (ref 9–23)
BUN: 11 mg/dL (ref 6–24)
Bilirubin Total: 0.6 mg/dL (ref 0.0–1.2)
CO2: 22 mmol/L (ref 20–29)
Calcium: 9.3 mg/dL (ref 8.7–10.2)
Chloride: 104 mmol/L (ref 96–106)
Creatinine, Ser: 0.81 mg/dL (ref 0.57–1.00)
Globulin, Total: 2.8 g/dL (ref 1.5–4.5)
Glucose: 86 mg/dL (ref 70–99)
Potassium: 4.9 mmol/L (ref 3.5–5.2)
Sodium: 139 mmol/L (ref 134–144)
Total Protein: 7.1 g/dL (ref 6.0–8.5)
eGFR: 94 mL/min/{1.73_m2} (ref >59)

## 2023-02-25 LAB — RPR: RPR Ser Ql: NONREACTIVE

## 2023-02-25 LAB — TSH: TSH: 1.4 u[IU]/mL (ref 0.450–4.500)

## 2023-02-25 LAB — CBC WITH DIFFERENTIAL/PLATELET
Basophils Absolute: 0.1 10*3/uL (ref 0.0–0.2)
Basos: 1 %
EOS (ABSOLUTE): 0.3 10*3/uL (ref 0.0–0.4)
Eos: 2 %
Hematocrit: 42.5 % (ref 34.0–46.6)
Hemoglobin: 14.2 g/dL (ref 11.1–15.9)
Immature Grans (Abs): 0.1 10*3/uL (ref 0.0–0.1)
Immature Granulocytes: 1 %
Lymphocytes Absolute: 2.9 10*3/uL (ref 0.7–3.1)
Lymphs: 23 %
MCH: 30.3 pg (ref 26.6–33.0)
MCHC: 33.4 g/dL (ref 31.5–35.7)
MCV: 91 fL (ref 79–97)
Monocytes Absolute: 1 10*3/uL — ABNORMAL HIGH (ref 0.1–0.9)
Monocytes: 8 %
Neutrophils Absolute: 8.4 10*3/uL — ABNORMAL HIGH (ref 1.4–7.0)
Neutrophils: 65 %
Platelets: 442 10*3/uL (ref 150–450)
RBC: 4.69 x10E6/uL (ref 3.77–5.28)
RDW: 13.1 % (ref 11.7–15.4)
WBC: 12.8 10*3/uL — ABNORMAL HIGH (ref 3.4–10.8)

## 2023-02-25 LAB — HEPATITIS B SURFACE ANTIGEN: Hepatitis B Surface Ag: NEGATIVE

## 2023-02-25 LAB — HIV ANTIBODY (ROUTINE TESTING W REFLEX): HIV Screen 4th Generation wRfx: NONREACTIVE

## 2023-02-26 LAB — CYTOLOGY - PAP
Chlamydia: NEGATIVE
Comment: NEGATIVE
Comment: NEGATIVE
Comment: NORMAL
Diagnosis: NEGATIVE
High risk HPV: NEGATIVE
Neisseria Gonorrhea: NEGATIVE

## 2023-02-27 ENCOUNTER — Other Ambulatory Visit: Payer: Self-pay | Admitting: Adult Health

## 2023-02-27 DIAGNOSIS — D72829 Elevated white blood cell count, unspecified: Secondary | ICD-10-CM

## 2023-02-27 MED ORDER — METRONIDAZOLE 500 MG PO TABS: 500.0000 mg | ORAL_TABLET | Freq: Two times a day (BID) | ORAL | 0 refills | Status: DC

## 2023-03-04 ENCOUNTER — Inpatient Hospital Stay: Payer: Medicaid Other | Attending: Oncology | Admitting: Oncology

## 2023-03-04 ENCOUNTER — Inpatient Hospital Stay: Payer: Medicaid Other

## 2023-03-04 ENCOUNTER — Encounter: Payer: Self-pay | Admitting: Oncology

## 2023-03-04 VITALS — BP 134/108 | HR 91 | Temp 97.8°F | Resp 18 | Ht 64.0 in | Wt 159.9 lb

## 2023-03-04 DIAGNOSIS — D729 Disorder of white blood cells, unspecified: Secondary | ICD-10-CM | POA: Diagnosis not present

## 2023-03-04 DIAGNOSIS — F17210 Nicotine dependence, cigarettes, uncomplicated: Secondary | ICD-10-CM | POA: Insufficient documentation

## 2023-03-04 DIAGNOSIS — D72828 Other elevated white blood cell count: Secondary | ICD-10-CM | POA: Diagnosis present

## 2023-03-04 LAB — CBC WITH DIFFERENTIAL/PLATELET
Abs Immature Granulocytes: 0.05 10*3/uL (ref 0.00–0.07)
Basophils Absolute: 0.1 10*3/uL (ref 0.0–0.1)
Basophils Relative: 1 %
Eosinophils Absolute: 0.2 10*3/uL (ref 0.0–0.5)
Eosinophils Relative: 1 %
HCT: 44.2 % (ref 36.0–46.0)
Hemoglobin: 14.4 g/dL (ref 12.0–15.0)
Immature Granulocytes: 0 %
Lymphocytes Relative: 22 %
Lymphs Abs: 2.7 10*3/uL (ref 0.7–4.0)
MCH: 30.3 pg (ref 26.0–34.0)
MCHC: 32.6 g/dL (ref 30.0–36.0)
MCV: 93.1 fL (ref 80.0–100.0)
Monocytes Absolute: 0.9 10*3/uL (ref 0.1–1.0)
Monocytes Relative: 8 %
Neutro Abs: 8.1 10*3/uL — ABNORMAL HIGH (ref 1.7–7.7)
Neutrophils Relative %: 68 %
Platelets: 435 10*3/uL — ABNORMAL HIGH (ref 150–400)
RBC: 4.75 MIL/uL (ref 3.87–5.11)
RDW: 14.2 % (ref 11.5–15.5)
WBC: 12 10*3/uL — ABNORMAL HIGH (ref 4.0–10.5)
nRBC: 0 % (ref 0.0–0.2)

## 2023-03-04 LAB — TECHNOLOGIST SMEAR REVIEW
Plt Morphology: NORMAL
RBC MORPHOLOGY: NORMAL
WBC MORPHOLOGY: NORMAL

## 2023-03-04 NOTE — Progress Notes (Signed)
Hematology/Oncology Consult note [facility] [phone] [phone]  Patient Care Team: Pcp, No as PCP - General   Name of the patient: Rachel Orr  [id number]  19-Jan-1981    Reason for referral-leukocytosis   Referring physician-Jennifer Valentina Lucks, NP  Date of visit: 03/04/23   History of presenting illness- Patient is a 43 year old female referred for leukocytosis.  Most recent CBC from 02/24/2023 showed white cell count of 12.8, H&H of 14.2/42.5 and a platelet count of 442.  Differential mainly showed neutrophilia with some monocytosis.  Of note patient has had a chronically elevated white cell count which has been ranging between 12-21 even dating back to 2014.  Hemoglobin and platelet counts have always been normal and differential is always shown neutrophilia and mild monocytosis.  Patient does smoke about half a pack of cigarettes per day for over 15 years.  She is doing well overall and denies any changes in her appetite or weight.  ECOG PS- 0  Pain scale- 0   Review of systems- Review of Systems  Constitutional:  Negative for chills, fever, malaise/fatigue and weight loss.  HENT:  Negative for congestion, ear discharge and nosebleeds.   Eyes:  Negative for blurred vision.  Respiratory:  Negative for cough, hemoptysis, sputum production, shortness of breath and wheezing.   Cardiovascular:  Negative for chest pain, palpitations, orthopnea and claudication.  Gastrointestinal:  Negative for abdominal pain, blood in stool, constipation, diarrhea, heartburn, melena, nausea and vomiting.  Genitourinary:  Negative for dysuria, flank pain, frequency, hematuria and urgency.  Musculoskeletal:  Negative for back pain, joint pain and myalgias.  Skin:  Negative for rash.  Neurological:  Negative for dizziness, tingling, focal weakness, seizures, weakness and headaches.  Endo/Heme/Allergies:  Does not bruise/bleed easily.   Psychiatric/Behavioral:  Negative for depression and suicidal ideas. The patient does not have insomnia.     No Known Allergies  Patient Active Problem List   Diagnosis Date Noted   Routine general medical examination at a health care facility 02/24/2023   Rash 02/24/2023   Encounter for well woman exam with routine gynecological exam 02/18/2022   Encounter for surveillance of injectable contraceptive 02/18/2022   Eczema 02/18/2022   Anxiety and depression 02/18/2022   Bilateral wrist pain 02/18/2022   Bilateral carpal tunnel syndrome 02/18/2022   Smoker 12/22/2019     Past Medical History:  Diagnosis Date   Anemia    Hypertension      Past Surgical History:  Procedure Laterality Date   NO PAST SURGERIES      Social History   Socioeconomic History   Marital status: Single    Spouse name: Not on file   Number of children: Not on file   Years of education: Not on file   Highest education level: Not on file  Occupational History   Not on file  Tobacco Use   Smoking status: Every Day    Packs/day: 1.00    Years: 20.00    Additional pack years: 0.00    Total pack years: 20.00    Types: Cigarettes   Smokeless tobacco: Never  Vaping Use   Vaping Use: Never used  Substance and Sexual Activity   Alcohol use: No   Drug use: No   Sexual activity: Yes    Birth control/protection: Injection  Other Topics Concern   Not on file  Social History Narrative   Not on file   Social Determinants of Health   Financial Resource Strain: Low Risk  (  02/24/2023)   Overall Financial Resource Strain (CARDIA)    Difficulty of Paying Living Expenses: Not hard at all  Food Insecurity: No Food Insecurity (02/24/2023)   Hunger Vital Sign    Worried About Running Out of Food in the Last Year: Never true    Ran Out of Food in the Last Year: Never true  Transportation Needs: No Transportation Needs (02/24/2023)   PRAPARE - Administrator, Civil Service (Medical): No     Lack of Transportation (Non-Medical): No  Physical Activity: Sufficiently Active (02/24/2023)   Exercise Vital Sign    Days of Exercise per Week: 7 days    Minutes of Exercise per Session: 30 min  Stress: Stress Concern Present (02/24/2023)   [employer] of Occupational Health - Occupational Stress Questionnaire    Feeling of Stress : Very much  Social Connections: Socially Isolated (02/24/2023)   Social Connection and Isolation Panel [NHANES]    Frequency of Communication with Friends and Family: More than three times a week    Frequency of Social Gatherings with Friends and Family: Never    Attends Religious Services: Never    Database administrator or Organizations: No    Attends Banker Meetings: Never    Marital Status: Never married  Intimate Partner Violence: Not At Risk (02/24/2023)   Humiliation, Afraid, Rape, and Kick questionnaire    Fear of Current or Ex-Partner: No    Emotionally Abused: No    Physically Abused: No    Sexually Abused: No     Family History  Problem Relation Age of Onset   Asthma Son    Hypertension Son    Hypertension Father    COPD Father    Hypertension Sister    Hypertension Paternal Uncle    Diabetes Paternal Grandmother    Hypertension Paternal Grandmother    Asthma Paternal Grandmother    COPD Paternal Grandmother      Current Outpatient Medications:    busPIRone (BUSPAR) 5 MG tablet, Take 1 tablet (5 mg total) by mouth 3 (three) times daily., Disp: 90 tablet, Rfl: 6   Cetirizine HCl (ZYRTEC PO), Take by mouth., Disp: , Rfl:    escitalopram (LEXAPRO) 20 MG tablet, Take 1 tablet (20 mg total) by mouth daily., Disp: 30 tablet, Rfl: 6   fluconazole (DIFLUCAN) 50 MG tablet, Take 50 mg by mouth daily., Disp: , Rfl:    medroxyPROGESTERone (DEPO-PROVERA) 150 MG/ML injection, Inject 1 mL (150 mg total) into the muscle every 3 (three) months., Disp: 1 mL, Rfl: 4   metroNIDAZOLE (FLAGYL) 500 MG tablet, Take 1 tablet (500 mg total)  by mouth 2 (two) times daily., Disp: 14 tablet, Rfl: 0   mometasone (ELOCON) 0.1 % lotion, Apply topically daily., Disp: 60 mL, Rfl: 3   Physical exam:  Vitals:   03/04/23 1331 03/04/23 1337  BP: (!) 139/100 (!) 134/108  Pulse: 83 91  Resp: 18   Temp: 97.8 F (36.6 C)   TempSrc: Tympanic   SpO2: 99%   Weight: 159 lb 14.4 oz (72.5 kg)   Height: 5\' 4"  (1.626 m)    Physical Exam Cardiovascular:     Rate and Rhythm: Normal rate and regular rhythm.     Heart sounds: Normal heart sounds.  Pulmonary:     Effort: Pulmonary effort is normal.     Breath sounds: Normal breath sounds.  Abdominal:     General: Bowel sounds are normal.     Palpations: Abdomen is  soft.  Lymphadenopathy:     Comments: No palpable cervical, supraclavicular, axillary or inguinal adenopathy    Skin:    General: Skin is warm and dry.  Neurological:     Mental Status: She is alert and oriented to person, place, and time.           Latest Ref Rng & Units 02/24/2023    4:10 PM  CMP  Glucose 70 - 99 mg/dL 86   BUN 6 - 24 mg/dL 11   Creatinine 5.28 - 1.00 mg/dL 4.13   Sodium 244 - 010 mmol/L 139   Potassium 3.5 - 5.2 mmol/L 4.9   Chloride 96 - 106 mmol/L 104   CO2 20 - 29 mmol/L 22   Calcium 8.7 - 10.2 mg/dL 9.3   Total Protein 6.0 - 8.5 g/dL 7.1   Total Bilirubin 0.0 - 1.2 mg/dL 0.6   Alkaline Phos 44 - 121 IU/L 106   AST 0 - 40 IU/L 18   ALT 0 - 32 IU/L 12       Latest Ref Rng & Units 02/24/2023    4:10 PM  CBC  WBC 3.4 - 10.8 x10E3/uL 12.8   Hemoglobin 11.1 - 15.9 g/dL 27.2   Hematocrit 53.6 - 46.6 % 42.5   Platelets 150 - 450 x10E3/uL 442    Assessment and plan- Patient is a 42 y.o. female referred for leukocytosis  Patient has had longstanding leukocytosis/neutrophilia with a white cell count ranging between 12-18 for the last 10 years without a clear rising trend.  Differential has occasionally shown mild monocytosis as well.  Hemoglobin and platelets are normal.  Given the chronicity  of her leukocytosis and neutrophilia this is unlikely secondary to primary bone marrow pathology.  I suspect this is secondary to smoking.  I will be checking a CBC with differential and peripheral flow cytometry today.  In person or video visit with her in 2 weeks time.  No indication for a bone marrow biopsy at this time   Thank you for this kind referral and the opportunity to participate in the care of this patient   Visit Diagnosis 1. Neutrophilia     Dr. Owens Shark, MD, MPH [facility] at [facility] [phone] 03/04/2023

## 2023-03-05 ENCOUNTER — Telehealth: Payer: Self-pay | Admitting: *Deleted

## 2023-03-05 NOTE — Telephone Encounter (Signed)
Pt is concerned about lab work done on 03/04/23 and doesn't want to wait until Mar 26, 2023 appointment to discuss.

## 2023-03-05 NOTE — Telephone Encounter (Signed)
We have to wait for flow cytometry results. We will try to accommodate her if they are back by next week

## 2023-03-05 NOTE — Telephone Encounter (Signed)
Nurse will call patient and let her know.

## 2023-03-06 LAB — COMP PANEL: LEUKEMIA/LYMPHOMA

## 2023-03-07 ENCOUNTER — Telehealth: Payer: Self-pay

## 2023-03-07 NOTE — Telephone Encounter (Signed)
Patient called stating she would like someone to call her and explain her test results and recent results of scan. Patient states she is very anxious and don't think she can wait until her next visit for results.

## 2023-03-10 ENCOUNTER — Ambulatory Visit: Payer: Medicaid Other | Admitting: Adult Health

## 2023-03-11 ENCOUNTER — Encounter: Payer: Self-pay | Admitting: Oncology

## 2023-03-11 ENCOUNTER — Inpatient Hospital Stay: Payer: Medicaid Other | Admitting: Oncology

## 2023-03-11 VITALS — BP 137/93 | HR 86 | Temp 100.0°F | Resp 18 | Ht 64.0 in | Wt 158.5 lb

## 2023-03-11 DIAGNOSIS — D729 Disorder of white blood cells, unspecified: Secondary | ICD-10-CM | POA: Diagnosis not present

## 2023-03-11 DIAGNOSIS — D72828 Other elevated white blood cell count: Secondary | ICD-10-CM | POA: Diagnosis not present

## 2023-03-12 ENCOUNTER — Ambulatory Visit: Payer: Medicaid Other | Admitting: Oncology

## 2023-03-12 NOTE — Progress Notes (Signed)
Hematology/Oncology Consult note [facility]  Telephone:([phone] [phone]  Patient Care Team: Pcp, No as PCP - General   Name of the patient: Rachel Orr  [id number]  1980/12/18   Date of visit: 03/12/23  Diagnosis-neutrophilia likely secondary due to smoking  Chief complaint/ Reason for visit-discuss results of blood work  Heme/Onc history: Patient is a 42 year old female referred for leukocytosis.  Most recent CBC from 02/24/2023 showed white cell count of 12.8, H&H of 14.2/42.5 and a platelet count of 442.  Differential mainly showed neutrophilia with some monocytosis.  Of note patient has had a chronically elevated white cell count which has been ranging between 12-21 even dating back to 2014.  Hemoglobin and platelet counts have always been normal and differential is always shown neutrophilia and mild monocytosis. Patient does smoke about half a pack of cigarettes per day for over 15 years.   Results of blood work from 03/04/2023 showed white cell count of 12, H&H of 14.4/42.2 and a platelet count of 435.  Differential mainly showed neutrophilia.  Flow cytometry did not show any immunophenotypic abnormality.  Smear review unremarkable.  Interval history-no acute issues since last visit.  ECOG PS- 0 Pain scale- 0   Review of systems- Review of Systems  Constitutional:  Negative for chills, fever, malaise/fatigue and weight loss.  HENT:  Negative for congestion, ear discharge and nosebleeds.   Eyes:  Negative for blurred vision.  Respiratory:  Negative for cough, hemoptysis, sputum production, shortness of breath and wheezing.   Cardiovascular:  Negative for chest pain, palpitations, orthopnea and claudication.  Gastrointestinal:  Negative for abdominal pain, blood in stool, constipation, diarrhea, heartburn, melena, nausea and vomiting.  Genitourinary:  Negative for dysuria, flank pain, frequency, hematuria and urgency.  Musculoskeletal:   Negative for back pain, joint pain and myalgias.  Skin:  Negative for rash.  Neurological:  Negative for dizziness, tingling, focal weakness, seizures, weakness and headaches.  Endo/Heme/Allergies:  Does not bruise/bleed easily.  Psychiatric/Behavioral:  Negative for depression and suicidal ideas. The patient does not have insomnia.       No Known Allergies   Past Medical History:  Diagnosis Date   Anemia    Hypertension      Past Surgical History:  Procedure Laterality Date   NO PAST SURGERIES      Social History   Socioeconomic History   Marital status: Single    Spouse name: Not on file   Number of children: Not on file   Years of education: Not on file   Highest education level: Not on file  Occupational History   Not on file  Tobacco Use   Smoking status: Every Day    Packs/day: 1.00    Years: 20.00    Additional pack years: 0.00    Total pack years: 20.00    Types: Cigarettes   Smokeless tobacco: Never  Vaping Use   Vaping Use: Never used  Substance and Sexual Activity   Alcohol use: No   Drug use: No   Sexual activity: Yes    Birth control/protection: Injection  Other Topics Concern   Not on file  Social History Narrative   Not on file   Social Determinants of Health   Financial Resource Strain: Low Risk  (02/24/2023)   Overall Financial Resource Strain (CARDIA)    Difficulty of Paying Living Expenses: Not hard at all  Food Insecurity: Food Insecurity Present (03/04/2023)   Hunger Vital Sign    Worried  About Running Out of Food in the Last Year: Never true    Ran Out of Food in the Last Year: Sometimes true  Transportation Needs: No Transportation Needs (03/04/2023)   PRAPARE - Administrator, Civil Service (Medical): No    Lack of Transportation (Non-Medical): No  Physical Activity: Sufficiently Active (02/24/2023)   Exercise Vital Sign    Days of Exercise per Week: 7 days    Minutes of Exercise per Session: 30 min  Stress: Stress  Concern Present (02/24/2023)   [employer] of Occupational Health - Occupational Stress Questionnaire    Feeling of Stress : Very much  Social Connections: Socially Isolated (02/24/2023)   Social Connection and Isolation Panel [NHANES]    Frequency of Communication with Friends and Family: More than three times a week    Frequency of Social Gatherings with Friends and Family: Never    Attends Religious Services: Never    Database administrator or Organizations: No    Attends Banker Meetings: Never    Marital Status: Never married  Intimate Partner Violence: Not At Risk (03/04/2023)   Humiliation, Afraid, Rape, and Kick questionnaire    Fear of Current or Ex-Partner: No    Emotionally Abused: No    Physically Abused: No    Sexually Abused: No    Family History  Problem Relation Age of Onset   Asthma Son    Hypertension Son    Hypertension Father    COPD Father    Hypertension Sister    Hypertension Paternal Uncle    Diabetes Paternal Grandmother    Hypertension Paternal Grandmother    Asthma Paternal Grandmother    COPD Paternal Grandmother      Current Outpatient Medications:    busPIRone (BUSPAR) 5 MG tablet, Take 1 tablet (5 mg total) by mouth 3 (three) times daily., Disp: 90 tablet, Rfl: 6   Cetirizine HCl (ZYRTEC PO), Take by mouth., Disp: , Rfl:    escitalopram (LEXAPRO) 20 MG tablet, Take 1 tablet (20 mg total) by mouth daily., Disp: 30 tablet, Rfl: 6   fluconazole (DIFLUCAN) 50 MG tablet, Take 50 mg by mouth daily., Disp: , Rfl:    medroxyPROGESTERone (DEPO-PROVERA) 150 MG/ML injection, Inject 1 mL (150 mg total) into the muscle every 3 (three) months., Disp: 1 mL, Rfl: 4   metroNIDAZOLE (FLAGYL) 500 MG tablet, Take 1 tablet (500 mg total) by mouth 2 (two) times daily., Disp: 14 tablet, Rfl: 0   mometasone (ELOCON) 0.1 % lotion, Apply topically daily., Disp: 60 mL, Rfl: 3  Physical exam:  Vitals:   03/11/23 1412 03/11/23 1415  BP: (!) 149/96 (!)  137/93  Pulse: 91 86  Resp: 18   Temp: 100 F (37.8 C)   TempSrc: Tympanic   SpO2: 98%   Weight: 158 lb 8 oz (71.9 kg)   Height: 5\' 4"  (1.626 m)    Physical Exam Cardiovascular:     Rate and Rhythm: Normal rate and regular rhythm.     Heart sounds: Normal heart sounds.  Pulmonary:     Effort: Pulmonary effort is normal.  Skin:    General: Skin is warm and dry.  Neurological:     Mental Status: She is alert and oriented to person, place, and time.         Latest Ref Rng & Units 02/24/2023    4:10 PM  CMP  Glucose 70 - 99 mg/dL 86   BUN 6 - 24 mg/dL 11  Creatinine 0.57 - 1.00 mg/dL 1.61   Sodium 096 - 045 mmol/L 139   Potassium 3.5 - 5.2 mmol/L 4.9   Chloride 96 - 106 mmol/L 104   CO2 20 - 29 mmol/L 22   Calcium 8.7 - 10.2 mg/dL 9.3   Total Protein 6.0 - 8.5 g/dL 7.1   Total Bilirubin 0.0 - 1.2 mg/dL 0.6   Alkaline Phos 44 - 121 IU/L 106   AST 0 - 40 IU/L 18   ALT 0 - 32 IU/L 12       Latest Ref Rng & Units 03/04/2023    2:05 PM  CBC  WBC 4.0 - 10.5 K/uL 12.0   Hemoglobin 12.0 - 15.0 g/dL 40.9   Hematocrit 81.1 - 46.0 % 44.2   Platelets 150 - 400 K/uL 435     Assessment and plan- Patient is a 42 y.o. female referred for neutrophilia  Patient has had mild leukocytosis/neutrophilia at least dating back to the last 10 years.  Her white cell count fluctuates between 12-18.  Peripheral flow cytometry shows no immunophenotypic abnormality.  Hemoglobin and platelets are normal.  This is likely secondary due to smoking.  I will see her once in 6 months with a repeat CBC with differential and if overall her counts are stable she does not require follow-up with me   Visit Diagnosis 1. Neutrophilia      Dr. Owens Shark, MD, MPH [facility] [phone] 03/12/2023 4:44 PM

## 2023-03-26 ENCOUNTER — Ambulatory Visit: Payer: Medicaid Other | Admitting: Oncology

## 2023-05-07 ENCOUNTER — Telehealth: Payer: Self-pay

## 2023-05-07 ENCOUNTER — Other Ambulatory Visit: Payer: Self-pay | Admitting: Adult Health

## 2023-05-07 ENCOUNTER — Ambulatory Visit (INDEPENDENT_AMBULATORY_CARE_PROVIDER_SITE_OTHER): Payer: Medicaid Other | Admitting: *Deleted

## 2023-05-07 DIAGNOSIS — Z3042 Encounter for surveillance of injectable contraceptive: Secondary | ICD-10-CM

## 2023-05-07 MED ORDER — MEDROXYPROGESTERONE ACETATE 150 MG/ML IM SUSY
150.0000 mg | PREFILLED_SYRINGE | Freq: Once | INTRAMUSCULAR | Status: AC
  Administered 2023-05-07: 150 mg via INTRAMUSCULAR

## 2023-05-07 NOTE — Progress Notes (Signed)
   NURSE VISIT- INJECTION  SUBJECTIVE:  Rachel Orr is a 42 y.o. [id number] female here for a Depo Provera for contraception/period management. She is a GYN patient.   OBJECTIVE:  There were no vitals taken for this visit.  Appears well, in no apparent distress  Injection administered in: Left deltoid  Meds ordered this encounter  Medications   medroxyPROGESTERone Acetate SUSY 150 mg    ASSESSMENT: GYN patient Depo Provera for contraception/period management PLAN: Follow-up: in 11-13 weeks for next Depo   Jobe Marker  05/07/2023 4:16 PM

## 2023-05-07 NOTE — Telephone Encounter (Signed)
Can you please send in a refill for her Depo she has an appointment today at 3:30.

## 2023-07-30 ENCOUNTER — Ambulatory Visit (INDEPENDENT_AMBULATORY_CARE_PROVIDER_SITE_OTHER): Payer: Medicaid Other | Admitting: *Deleted

## 2023-07-30 DIAGNOSIS — Z3042 Encounter for surveillance of injectable contraceptive: Secondary | ICD-10-CM

## 2023-07-30 MED ORDER — MEDROXYPROGESTERONE ACETATE 150 MG/ML IM SUSY
150.0000 mg | PREFILLED_SYRINGE | Freq: Once | INTRAMUSCULAR | Status: AC
Start: 2023-07-30 — End: 2023-07-30
  Administered 2023-07-30: 150 mg via INTRAMUSCULAR

## 2023-07-30 NOTE — Progress Notes (Signed)
   NURSE VISIT- INJECTION  SUBJECTIVE:  Rachel Orr is a 42 y.o. [id number] female here for a Depo Provera for contraception/period management. She is a GYN patient.   OBJECTIVE:  There were no vitals taken for this visit.  Appears well, in no apparent distress  Injection administered in: Right deltoid  Meds ordered this encounter  Medications   medroxyPROGESTERone Acetate SUSY 150 mg    ASSESSMENT: GYN patient Depo Provera for contraception/period management PLAN: Follow-up: in 11-13 weeks for next Depo   Malachy Mood  07/30/2023 4:06 PM

## 2023-09-10 ENCOUNTER — Encounter: Payer: Self-pay | Admitting: Oncology

## 2023-09-10 ENCOUNTER — Inpatient Hospital Stay: Payer: Medicaid Other

## 2023-09-10 ENCOUNTER — Inpatient Hospital Stay: Payer: Medicaid Other | Attending: Oncology | Admitting: Oncology

## 2023-09-10 VITALS — BP 143/108 | HR 86 | Temp 97.4°F | Resp 18 | Ht 64.0 in | Wt 157.2 lb

## 2023-09-10 DIAGNOSIS — D72828 Other elevated white blood cell count: Secondary | ICD-10-CM

## 2023-09-10 DIAGNOSIS — F17210 Nicotine dependence, cigarettes, uncomplicated: Secondary | ICD-10-CM | POA: Insufficient documentation

## 2023-09-10 DIAGNOSIS — I10 Essential (primary) hypertension: Secondary | ICD-10-CM | POA: Diagnosis not present

## 2023-09-10 DIAGNOSIS — Z79899 Other long term (current) drug therapy: Secondary | ICD-10-CM | POA: Insufficient documentation

## 2023-09-10 LAB — CBC WITH DIFFERENTIAL/PLATELET
Abs Immature Granulocytes: 0.07 10*3/uL (ref 0.00–0.07)
Basophils Absolute: 0.1 10*3/uL (ref 0.0–0.1)
Basophils Relative: 1 %
Eosinophils Absolute: 0.2 10*3/uL (ref 0.0–0.5)
Eosinophils Relative: 2 %
HCT: 45.6 % (ref 36.0–46.0)
Hemoglobin: 14.8 g/dL (ref 12.0–15.0)
Immature Granulocytes: 1 %
Lymphocytes Relative: 23 %
Lymphs Abs: 2.7 10*3/uL (ref 0.7–4.0)
MCH: 30.3 pg (ref 26.0–34.0)
MCHC: 32.5 g/dL (ref 30.0–36.0)
MCV: 93.3 fL (ref 80.0–100.0)
Monocytes Absolute: 0.8 10*3/uL (ref 0.1–1.0)
Monocytes Relative: 7 %
Neutro Abs: 7.9 10*3/uL — ABNORMAL HIGH (ref 1.7–7.7)
Neutrophils Relative %: 66 %
Platelets: 430 10*3/uL — ABNORMAL HIGH (ref 150–400)
RBC: 4.89 MIL/uL (ref 3.87–5.11)
RDW: 13.3 % (ref 11.5–15.5)
WBC: 11.8 10*3/uL — ABNORMAL HIGH (ref 4.0–10.5)
nRBC: 0 % (ref 0.0–0.2)

## 2023-09-10 NOTE — Progress Notes (Signed)
Hematology/Oncology Consult note [facility]  [phone] [phone]  Patient Care Team: Pcp, No as PCP - General   Name of the patient: Rachel Orr  [id number]  03-19-1981   Date of visit: 09/10/23  Diagnosis-neutrophilia secondary to smoking  Chief complaint/ Reason for visit-routine follow-up of neutrophilia  Heme/Onc history:  Patient is a 42 year old female referred for leukocytosis.  Most recent CBC from 02/24/2023 showed white cell count of 12.8, H&H of 14.2/42.5 and a platelet count of 442.  Differential mainly showed neutrophilia with some monocytosis.  Of note patient has had a chronically elevated white cell count which has been ranging between 12-21 even dating back to 2014.  Hemoglobin and platelet counts have always been normal and differential is always shown neutrophilia and mild monocytosis. Patient does smoke about half a pack of cigarettes per day for over 15 years.    Results of blood work from 03/04/2023 showed white cell count of 12, H&H of 14.4/42.2 and a platelet count of 435.  Differential mainly showed neutrophilia.  Flow cytometry did not show any immunophenotypic abnormality.  Smear review unremarkable.  Interval history-she is doing well overall.  She continues to smoke.  No recent hospitalizations  ECOG PS- 0 Pain scale- 0   Review of systems- Review of Systems  Constitutional:  Negative for chills, fever, malaise/fatigue and weight loss.  HENT:  Negative for congestion, ear discharge and nosebleeds.   Eyes:  Negative for blurred vision.  Respiratory:  Negative for cough, hemoptysis, sputum production, shortness of breath and wheezing.   Cardiovascular:  Negative for chest pain, palpitations, orthopnea and claudication.  Gastrointestinal:  Negative for abdominal pain, blood in stool, constipation, diarrhea, heartburn, melena, nausea and vomiting.  Genitourinary:  Negative for dysuria, flank pain, frequency,  hematuria and urgency.  Musculoskeletal:  Negative for back pain, joint pain and myalgias.  Skin:  Negative for rash.  Neurological:  Negative for dizziness, tingling, focal weakness, seizures, weakness and headaches.  Endo/Heme/Allergies:  Does not bruise/bleed easily.  Psychiatric/Behavioral:  Negative for depression and suicidal ideas. The patient does not have insomnia.       No Known Allergies   Past Medical History:  Diagnosis Date   Anemia    Hypertension      Past Surgical History:  Procedure Laterality Date   NO PAST SURGERIES      Social History   Socioeconomic History   Marital status: Single    Spouse name: Not on file   Number of children: Not on file   Years of education: Not on file   Highest education level: Not on file  Occupational History   Not on file  Tobacco Use   Smoking status: Every Day    Current packs/day: 1.00    Average packs/day: 1 pack/day for 20.0 years (20.0 ttl pk-yrs)    Types: Cigarettes   Smokeless tobacco: Never  Vaping Use   Vaping status: Never Used  Substance and Sexual Activity   Alcohol use: No   Drug use: No   Sexual activity: Yes    Birth control/protection: Injection  Other Topics Concern   Not on file  Social History Narrative   Not on file   Social Determinants of Health   Financial Resource Strain: Low Risk  (02/24/2023)   Overall Financial Resource Strain (CARDIA)    Difficulty of Paying Living Expenses: Not hard at all  Food Insecurity: Food Insecurity Present (03/04/2023)   Hunger Vital Sign  Worried About Programme researcher, broadcasting/film/video in the Last Year: Never true    Ran Out of Food in the Last Year: Sometimes true  Transportation Needs: No Transportation Needs (03/04/2023)   PRAPARE - Administrator, Civil Service (Medical): No    Lack of Transportation (Non-Medical): No  Physical Activity: Sufficiently Active (02/24/2023)   Exercise Vital Sign    Days of Exercise per Week: 7 days    Minutes of  Exercise per Session: 30 min  Stress: Stress Concern Present (02/24/2023)   [employer] of Occupational Health - Occupational Stress Questionnaire    Feeling of Stress : Very much  Social Connections: Socially Isolated (02/24/2023)   Social Connection and Isolation Panel [NHANES]    Frequency of Communication with Friends and Family: More than three times a week    Frequency of Social Gatherings with Friends and Family: Never    Attends Religious Services: Never    Database administrator or Organizations: No    Attends Banker Meetings: Never    Marital Status: Never married  Intimate Partner Violence: Not At Risk (03/04/2023)   Humiliation, Afraid, Rape, and Kick questionnaire    Fear of Current or Ex-Partner: No    Emotionally Abused: No    Physically Abused: No    Sexually Abused: No    Family History  Problem Relation Age of Onset   Asthma Son    Hypertension Son    Hypertension Father    COPD Father    Hypertension Sister    Hypertension Paternal Uncle    Diabetes Paternal Grandmother    Hypertension Paternal Grandmother    Asthma Paternal Grandmother    COPD Paternal Grandmother      Current Outpatient Medications:    busPIRone (BUSPAR) 5 MG tablet, Take 1 tablet (5 mg total) by mouth 3 (three) times daily., Disp: 90 tablet, Rfl: 6   Cetirizine HCl (ZYRTEC PO), Take by mouth., Disp: , Rfl:    escitalopram (LEXAPRO) 20 MG tablet, Take 1 tablet (20 mg total) by mouth daily., Disp: 30 tablet, Rfl: 6   medroxyPROGESTERone Acetate 150 MG/ML SUSY, INJECT 1 ML INTO THE MUSCLE EVERY 3 MONTHS, Disp: 1 mL, Rfl: 4   mometasone (ELOCON) 0.1 % lotion, Apply topically daily., Disp: 60 mL, Rfl: 3   sertraline (ZOLOFT) 25 MG tablet, PLEASE SEE ATTACHED FOR DETAILED DIRECTIONS, Disp: , Rfl:   Physical exam:  Vitals:   09/10/23 1316 09/10/23 1319  BP: (!) 140/102 (!) 143/108  Pulse: 86   Resp: 18   Temp: (!) 97.4 F (36.3 C)   TempSrc: Tympanic   SpO2: 100%    Weight: 157 lb 3.2 oz (71.3 kg)   Height: 5\' 4"  (1.626 m)    Physical Exam Cardiovascular:     Rate and Rhythm: Normal rate and regular rhythm.     Heart sounds: Normal heart sounds.  Pulmonary:     Effort: Pulmonary effort is normal.     Breath sounds: Normal breath sounds.  Skin:    General: Skin is warm and dry.  Neurological:     Mental Status: She is alert and oriented to person, place, and time.         Latest Ref Rng & Units 02/24/2023    4:10 PM  CMP  Glucose 70 - 99 mg/dL 86   BUN 6 - 24 mg/dL 11   Creatinine 1.61 - 1.00 mg/dL 0.96   Sodium 045 - 409 mmol/L 139  Potassium 3.5 - 5.2 mmol/L 4.9   Chloride 96 - 106 mmol/L 104   CO2 20 - 29 mmol/L 22   Calcium 8.7 - 10.2 mg/dL 9.3   Total Protein 6.0 - 8.5 g/dL 7.1   Total Bilirubin 0.0 - 1.2 mg/dL 0.6   Alkaline Phos 44 - 121 IU/L 106   AST 0 - 40 IU/L 18   ALT 0 - 32 IU/L 12       Latest Ref Rng & Units 09/10/2023   12:47 PM  CBC  WBC 4.0 - 10.5 K/uL 11.8   Hemoglobin 12.0 - 15.0 g/dL 40.9   Hematocrit 81.1 - 46.0 % 45.6   Platelets 150 - 400 K/uL 430     No images are attached to the encounter.  No results found.   Assessment and plan- Patient is a 42 y.o. female referred for neutrophilia  Patient has mild neutrophilia with a white count that fluctuates between 11-18 and has been in the same range over the last 5 years.  Flow cytometry did not show any evidence of immunophenotypic abnormality.  I suspect her neutrophilia as well as her mild thrombocytosis is secondary to smoking.  This does not require any further workup such as a bone marrow biopsy.  No medication management indicated.  She will continue to follow-up with her primary care doctor and if her white cell count is consistently 20 or higher with an upward trend she can be referred to [place] back in the future.  Hypertension: Her diastolic blood pressures are between 90-110 in our office.  She will need to follow-up with her PCP for the same    Visit Diagnosis 1. Neutrophilia      Dr. Owens Shark, MD, MPH [facility] [phone] 09/10/2023 1:53 PM

## 2023-10-23 ENCOUNTER — Ambulatory Visit: Payer: Medicaid Other

## 2023-10-27 ENCOUNTER — Ambulatory Visit: Payer: Medicaid Other

## 2023-10-28 ENCOUNTER — Ambulatory Visit: Payer: Medicaid Other

## 2023-10-31 ENCOUNTER — Ambulatory Visit: Payer: Medicaid Other

## 2023-11-11 ENCOUNTER — Other Ambulatory Visit (INDEPENDENT_AMBULATORY_CARE_PROVIDER_SITE_OTHER): Payer: Medicaid Other | Admitting: *Deleted

## 2023-11-11 DIAGNOSIS — Z3042 Encounter for surveillance of injectable contraceptive: Secondary | ICD-10-CM

## 2023-11-11 DIAGNOSIS — Z3202 Encounter for pregnancy test, result negative: Secondary | ICD-10-CM

## 2023-11-11 LAB — POCT URINE PREGNANCY: Preg Test, Ur: NEGATIVE

## 2023-11-11 MED ORDER — MEDROXYPROGESTERONE ACETATE 150 MG/ML IM SUSY
150.0000 mg | PREFILLED_SYRINGE | Freq: Once | INTRAMUSCULAR | Status: AC
Start: 2023-11-11 — End: 2023-11-11
  Administered 2023-11-11: 150 mg via INTRAMUSCULAR

## 2023-11-11 NOTE — Progress Notes (Signed)
   NURSE VISIT- INJECTION  SUBJECTIVE:  Rachel Orr is a 43 y.o. [id number] female here for a Depo Provera  for contraception/period management. She is a GYN patient.   OBJECTIVE:  There were no vitals taken for this visit.  Appears well, in no apparent distress  Injection administered in: Left deltoid  Meds ordered this encounter  Medications   medroxyPROGESTERone  Acetate SUSY 150 mg    ASSESSMENT: GYN patient Depo Provera  for contraception/period management PLAN: Follow-up: in 11-13 weeks for next Depo   Rutherford Rover  11/11/2023 3:35 PM

## 2023-12-03 ENCOUNTER — Ambulatory Visit: Payer: Medicaid Other | Admitting: Adult Health

## 2023-12-03 ENCOUNTER — Encounter: Payer: Self-pay | Admitting: Adult Health

## 2023-12-03 VITALS — BP 141/87 | HR 93 | Ht 64.0 in | Wt 156.0 lb

## 2023-12-03 DIAGNOSIS — F32A Depression, unspecified: Secondary | ICD-10-CM

## 2023-12-03 DIAGNOSIS — F419 Anxiety disorder, unspecified: Secondary | ICD-10-CM | POA: Diagnosis not present

## 2023-12-03 DIAGNOSIS — I10 Essential (primary) hypertension: Secondary | ICD-10-CM

## 2023-12-03 DIAGNOSIS — Z7689 Persons encountering health services in other specified circumstances: Secondary | ICD-10-CM | POA: Diagnosis not present

## 2023-12-03 MED ORDER — HYDROXYZINE PAMOATE 25 MG PO CAPS: 25.0000 mg | ORAL_CAPSULE | Freq: Three times a day (TID) | ORAL | 2 refills | Status: DC | PRN

## 2023-12-03 MED ORDER — AMLODIPINE BESYLATE 5 MG PO TABS: 5.0000 mg | ORAL_TABLET | Freq: Every day | ORAL | 3 refills | Status: DC

## 2023-12-03 MED ORDER — SERTRALINE HCL 50 MG PO TABS: 50.0000 mg | ORAL_TABLET | Freq: Every day | ORAL | 3 refills | Status: DC

## 2023-12-03 NOTE — Progress Notes (Signed)
 Subjective:     Patient ID: Elenor JONELLE Borg, female   DOB: 08-21-1981, 43 y.o.   MRN: [id number]  HPI Mahreen is a 43 year old white female,single, G5P5005, in saying BP has been elevated at home and she is anxious and depressed, not taking any meds currently. Has seen therapist at [facility] but stopped due to cost. She says 3 year is a handful. Her sister who is  56 had MI Monday and had 2 stints placed.   She needs PCP     Component Value Date/Time   DIAGPAP  02/24/2023 1514    - Negative for intraepithelial lesion or malignancy (NILM)   DIAGPAP  03/01/2020 1045    - Negative for intraepithelial lesion or malignancy (NILM)   HPVHIGH Negative 02/24/2023 1514   HPVHIGH Negative 03/01/2020 1045   ADEQPAP  02/24/2023 1514    Satisfactory for evaluation; transformation zone component PRESENT.   ADEQPAP  03/01/2020 1045    Satisfactory for evaluation; transformation zone component ABSENT.    Review of Systems Elevated BP at home +anxiety and depression, stopped meds Not sleeping well, 43 year old gets up early Reviewed past medical,surgical, social and family history. Reviewed medications and allergies.     Objective:   Physical Exam BP (!) 141/87 (BP Location: Right Arm, Patient Position: Sitting, Cuff Size: Normal)   Pulse 93   Ht 5' 4 (1.626 m)   Wt 156 lb (70.8 kg)   BMI 26.78 kg/m     Skin warm and dry. Lungs: clear to ausculation bilaterally. Cardiovascular: regular rate and rhythm.  Fall risk is low    12/03/2023    2:50 PM 03/04/2023    1:59 PM 02/24/2023    3:02 PM  Depression screen PHQ 2/9  Decreased Interest 3 3 1   Down, Depressed, Hopeless 3 3 0  PHQ - 2 Score 6 6 1   Altered sleeping 3  1  Tired, decreased energy 3  0  Change in appetite 1  0  Feeling bad or failure about yourself  3  0  Trouble concentrating 3  1  Moving slowly or fidgety/restless 3  1  Suicidal thoughts 0  0  PHQ-9 Score 22  4       12/03/2023    2:51 PM 02/24/2023    3:02 PM 02/18/2022     3:35 PM 03/29/2020    9:55 AM  GAD 7 : Generalized Anxiety Score  Nervous, Anxious, on Edge 3 3 3 3   Control/stop worrying 3 3 3 3   Worry too much - different things 3 3 3 3   Trouble relaxing 3 3 3 3   Restless 3 3 3 3   Easily annoyed or irritable 3 3 3 3   Afraid - awful might happen 0 0 0 3  Total GAD 7 Score 18 18 18 21       Upstream - 12/03/23 1422       Pregnancy Intention Screening   Does the patient want to become pregnant in the next year? No    Does the patient's partner want to become pregnant in the next year? No    Would the patient like to discuss contraceptive options today? No      Contraception Wrap Up   Current Method Hormonal Injection    End Method Hormonal Injection    Contraception Counseling Provided Yes             Assessment:     1. Hypertension, unspecified type (Primary) Will start on Norvasc   5 mg 1 daily  Watch salt and sugars Decrease smoking Review DASH diet Keep check on BP at home and keep log to bring to visit in 4 weeks   2. Anxiety and depression Will rx zoloft  50 mg 1 daily and vistaril  25 mg prn Meds ordered this encounter  Medications   sertraline  (ZOLOFT ) 50 MG tablet    Sig: Take 1 tablet (50 mg total) by mouth daily.    Dispense:  30 tablet    Refill:  3    Supervising Provider:   JAYNE MINDER H [[id number]]   hydrOXYzine  (VISTARIL ) 25 MG capsule    Sig: Take 1 capsule (25 mg total) by mouth 3 (three) times daily as needed.    Dispense:  30 capsule    Refill:  2    Supervising Provider:   JAYNE MINDER H [[id number]]   amLODipine  (NORVASC ) 5 MG tablet    Sig: Take 1 tablet (5 mg total) by mouth daily.    Dispense:  30 tablet    Refill:  3    Supervising Provider:   JAYNE MINDER DEL [[id number]]   Referred to [facility]  - Ambulatory referral to Behavioral Health  3. Encounter to establish care Referred to Gloria Zarwolo NP - Ambulatory Referral to Primary Care     Plan:     Follow up in 4 weeks for ROS and BP check

## 2023-12-11 ENCOUNTER — Ambulatory Visit: Payer: Medicaid Other | Admitting: Dermatology

## 2023-12-30 ENCOUNTER — Telehealth: Payer: Self-pay | Admitting: Adult Health

## 2023-12-30 NOTE — Telephone Encounter (Signed)
 Patient called and was wanting to discuss some medicines before her appointment tomorrow. Please advise.

## 2023-12-30 NOTE — Telephone Encounter (Signed)
 Pt states the Hydroxyzine is not helping. Asked for Xanax. Pt was advised [facility]AG don't prescribe Xanax. Pt has appt tomorrow for BP recheck. Advised to keep that appt. JSY

## 2023-12-31 ENCOUNTER — Ambulatory Visit: Payer: Medicaid Other | Admitting: Adult Health

## 2023-12-31 ENCOUNTER — Encounter: Payer: Self-pay | Admitting: Adult Health

## 2023-12-31 VITALS — BP 135/86 | HR 86 | Ht 64.0 in | Wt 156.0 lb

## 2023-12-31 DIAGNOSIS — F32A Depression, unspecified: Secondary | ICD-10-CM

## 2023-12-31 DIAGNOSIS — F419 Anxiety disorder, unspecified: Secondary | ICD-10-CM | POA: Diagnosis not present

## 2023-12-31 DIAGNOSIS — Z1331 Encounter for screening for depression: Secondary | ICD-10-CM | POA: Diagnosis not present

## 2023-12-31 DIAGNOSIS — I10 Essential (primary) hypertension: Secondary | ICD-10-CM

## 2023-12-31 DIAGNOSIS — D72829 Elevated white blood cell count, unspecified: Secondary | ICD-10-CM | POA: Diagnosis not present

## 2023-12-31 NOTE — Progress Notes (Addendum)
 Subjective:     Patient ID: Rachel Orr, female   DOB: 03-25-1981, 43 y.o.   MRN: [id number]  HPI Susane is a 43 year old white female,single, G5P5005, back in for BP check and ROS on taking zoloft 50 mg and vistaril 25 mg prn. Says nerves sill bad.     Component Value Date/Time   DIAGPAP  02/24/2023 1514    - Negative for intraepithelial lesion or malignancy (NILM)   DIAGPAP  03/01/2020 1045    - Negative for intraepithelial lesion or malignancy (NILM)   HPVHIGH Negative 02/24/2023 1514   HPVHIGH Negative 03/01/2020 1045   ADEQPAP  02/24/2023 1514    Satisfactory for evaluation; transformation zone component PRESENT.   ADEQPAP  03/01/2020 1045    Satisfactory for evaluation; transformation zone component ABSENT.    Review of Systems She says nerves still bad Reviewed past medical,surgical, social and family history. Reviewed medications and allergies.     Objective:   Physical Exam BP 135/86 (BP Location: Left Arm, Patient Position: Sitting, Cuff Size: Normal)   Pulse 86   Ht 5\' 4"  (1.626 m)   Wt 156 lb (70.8 kg)   BMI 26.78 kg/m   Skin warm and dry.  Lungs: clear to ausculation bilaterally. Cardiovascular: regular rate and rhythm.        12/31/2023    3:53 PM 12/03/2023    2:50 PM 03/04/2023    1:59 PM  Depression screen PHQ 2/9  Decreased Interest 2 3 3   Down, Depressed, Hopeless 2 3 3   PHQ - 2 Score 4 6 6   Altered sleeping 0 3   Tired, decreased energy 2 3   Change in appetite 2 1   Feeling bad or failure about yourself  0 3   Trouble concentrating 2 3   Moving slowly or fidgety/restless 2 3   Suicidal thoughts 0 0   PHQ-9 Score 12 22        12/31/2023    3:54 PM 12/03/2023    2:51 PM 02/24/2023    3:02 PM 02/18/2022    3:35 PM  GAD 7 : Generalized Anxiety Score  Nervous, Anxious, on Edge 3 3 3 3   Control/stop worrying 2 3 3 3   Worry too much - different things 2 3 3 3   Trouble relaxing 2 3 3 3   Restless 2 3 3 3   Easily annoyed or irritable 3 3 3 3   Afraid  - awful might happen 0 0 0 0  Total GAD 7 Score 14 18 18 18       Upstream - 12/31/23 1557       Pregnancy Intention Screening   Does the patient want to become pregnant in the next year? No    Does the patient's partner want to become pregnant in the next year? No    Would the patient like to discuss contraceptive options today? No      Contraception Wrap Up   Current Method Hormonal Injection    End Method Hormonal Injection    Contraception Counseling Provided Yes             Assessment:     1. Anxiety and depression (Primary) PHQ 9 and GAD 7 better, but she says nerves still bad Will continue zoloft 50 mg 1 daily and vistaril 25 mg 1 every 8 hours prn, has refills  2. Hypertension, unspecified type BP is better, continue Norvasc 5 mg 1 daily, has refills  3. Leukocytosis, unspecified type History of elevated  WBC, will check CBC with diff - CBC w/Diff     Plan:     Follow up in 8 weeks for BP and anxiety/ depression check    Has appt 04/02/24 to see I Polanco.

## 2024-01-01 LAB — CBC WITH DIFFERENTIAL/PLATELET
Basophils Absolute: 0.1 10*3/uL (ref 0.0–0.2)
Basos: 1 %
EOS (ABSOLUTE): 0.2 10*3/uL (ref 0.0–0.4)
Eos: 1 %
Hematocrit: 43.2 % (ref 34.0–46.6)
Hemoglobin: 14.6 g/dL (ref 11.1–15.9)
Immature Grans (Abs): 0.1 10*3/uL (ref 0.0–0.1)
Immature Granulocytes: 1 %
Lymphocytes Absolute: 3.2 10*3/uL — ABNORMAL HIGH (ref 0.7–3.1)
Lymphs: 22 %
MCH: 30.9 pg (ref 26.6–33.0)
MCHC: 33.8 g/dL (ref 31.5–35.7)
MCV: 92 fL (ref 79–97)
Monocytes Absolute: 1.2 10*3/uL — ABNORMAL HIGH (ref 0.1–0.9)
Monocytes: 8 %
Neutrophils Absolute: 10 10*3/uL — ABNORMAL HIGH (ref 1.4–7.0)
Neutrophils: 67 %
Platelets: 480 10*3/uL — ABNORMAL HIGH (ref 150–450)
RBC: 4.72 x10E6/uL (ref 3.77–5.28)
RDW: 12.8 % (ref 11.7–15.4)
WBC: 14.7 10*3/uL — ABNORMAL HIGH (ref 3.4–10.8)

## 2024-01-12 ENCOUNTER — Ambulatory Visit: Payer: Medicaid Other | Admitting: Dermatology

## 2024-01-12 ENCOUNTER — Encounter: Payer: Self-pay | Admitting: Dermatology

## 2024-01-12 DIAGNOSIS — L409 Psoriasis, unspecified: Secondary | ICD-10-CM | POA: Insufficient documentation

## 2024-01-12 DIAGNOSIS — Z7189 Other specified counseling: Secondary | ICD-10-CM

## 2024-01-12 DIAGNOSIS — Z79899 Other long term (current) drug therapy: Secondary | ICD-10-CM

## 2024-01-12 NOTE — Patient Instructions (Addendum)
 Psoriasis is a chronic non-curable, but treatable genetic/hereditary disease that may have other systemic features affecting other organ systems such as joints (Psoriatic Arthritis). It is associated with an increased risk of inflammatory bowel disease, heart disease, non-alcoholic fatty liver disease, and depression.  Treatments include light and laser treatments; topical medications; and systemic medications including oral and injectables.   Plan to start Cosentyx pending normal labs and insurance approval.    Reviewed risks of biologics including immunosuppression, infections, injection site reaction, and failure to improve condition. Goal is control of skin condition, not cure.  Some older biologics such as Humira and Enbrel may slightly increase risk of malignancy and may worsen congestive heart failure.  Taltz and Cosentyx may cause inflammatory bowel disease to flare. The use of biologics requires long term medication management, including periodic office visits and monitoring of blood work.    Due to recent changes in healthcare laws, you may see results of your pathology and/or laboratory studies on MyChart before the doctors have had a chance to review them. We understand that in some cases there may be results that are confusing or concerning to you. Please understand that not all results are received at the same time and often the doctors may need to interpret multiple results in order to provide you with the best plan of care or course of treatment. Therefore, we ask that you please give [place] 2 business days to thoroughly review all your results before contacting the office for clarification. Should we see a critical lab result, you will be contacted sooner.   If You Need Anything After Your Visit  If you have any questions or concerns for your doctor, please call our main line at [phone] and press option 4 to reach your doctor's medical assistant. If no one answers, please leave a  voicemail as directed and we will return your call as soon as possible. Messages left after 4 pm will be answered the following business day.   You may also send [place] a message via MyChart. We typically respond to MyChart messages within 1-2 business days.  For prescription refills, please ask your pharmacy to contact our office. Our fax number is [phone].  If you have an urgent issue when the clinic is closed that cannot wait until the next business day, you can page your doctor at the number below.    Please note that while we do our best to be available for urgent issues outside of office hours, we are not available 24/7.   If you have an urgent issue and are unable to reach [place], you may choose to seek medical care at your doctor's office, retail clinic, urgent care center, or emergency room.  If you have a medical emergency, please immediately call 911 or go to the emergency department.  Pager Numbers  - Dr. Gwen Pounds: [phone]  - Dr. Roseanne Reno: [phone]  - Dr. Katrinka Blazing: [phone]   In the event of inclement weather, please call our main line at [phone] for an update on the status of any delays or closures.  Dermatology Medication Tips: Please keep the boxes that topical medications come in in order to help keep track of the instructions about where and how to use these. Pharmacies typically print the medication instructions only on the boxes and not directly on the medication tubes.   If your medication is too expensive, please contact our office at [phone] option 4 or send [place] a message through MyChart.   We are unable to  tell what your co-pay for medications will be in advance as this is different depending on your insurance coverage. However, we may be able to find a substitute medication at lower cost or fill out paperwork to get insurance to cover a needed medication.   If a prior authorization is required to get your medication covered by your insurance  company, please allow [place] 1-2 business days to complete this process.  Drug prices often vary depending on where the prescription is filled and some pharmacies may offer cheaper prices.  The website www.goodrx.com contains coupons for medications through different pharmacies. The prices here do not account for what the cost may be with help from insurance (it may be cheaper with your insurance), but the website can give you the price if you did not use any insurance.  - You can print the associated coupon and take it with your prescription to the pharmacy.  - You may also stop by our office during regular business hours and pick up a GoodRx coupon card.  - If you need your prescription sent electronically to a different pharmacy, notify our office through [facility] or by phone at [phone] option 4.     Si Usted Necesita Algo Despus de Su Visita  Tambin puede enviarnos un mensaje a travs de Clinical cytogeneticist. Por lo general respondemos a los mensajes de MyChart en el transcurso de 1 a 2 das hbiles.  Para renovar recetas, por favor pida a su farmacia que se ponga en contacto con nuestra oficina. Annie Sable de fax es [place] [phone].  Si tiene un asunto urgente cuando la clnica est cerrada y que no puede esperar hasta el siguiente da hbil, puede llamar/localizar a su doctor(a) al nmero que aparece a continuacin.   Por favor, tenga en cuenta que aunque hacemos todo lo posible para estar disponibles para asuntos urgentes fuera del horario de [place], no estamos disponibles las 24 horas del da, los 7 [address]  [address] de la [place].   Si tiene un problema urgente y no puede comunicarse con nosotros, puede optar por buscar atencin mdica  en el consultorio de su doctor(a), en una clnica privada, en un centro de atencin urgente o en una sala de emergencias.  Si tiene Engineer, drilling, por favor llame inmediatamente al 911 o vaya a la sala de emergencias.  Nmeros de bper  - Dr.  Gwen Pounds: [phone]  - Dra. Roseanne Reno: [phone]  - Dr. Katrinka Blazing: [phone]   En caso de inclemencias del tiempo, por favor llame a Lacy Duverney principal al [phone] para una actualizacin sobre el [place] de cualquier retraso o cierre.  Consejos para la medicacin en dermatologa: Por favor, guarde las cajas en las que vienen los medicamentos de uso tpico para ayudarle a seguir las instrucciones sobre dnde y cmo usarlos. Las farmacias generalmente imprimen las instrucciones del medicamento slo en las cajas y no directamente en los tubos del [place].   Si su medicamento es muy caro, por favor, pngase en contacto con Rolm Gala llamando al [phone] y presione la opcin 4 o envenos un mensaje a travs de Clinical cytogeneticist.   No podemos decirle cul ser su copago por los medicamentos por adelantado ya que esto es diferente dependiendo de la cobertura de su seguro. Sin embargo, es posible que podamos encontrar un medicamento sustituto a Audiological scientist un formulario para que el seguro cubra el medicamento que se considera necesario.   Si se requiere una autorizacin previa para que su compaa de seguros  cubra su medicamento, por favor permtanos de 1 a 2 das hbiles para completar [address]  Los precios de los medicamentos varan con frecuencia dependiendo del Environmental consultant de dnde se surte la receta y alguna farmacias pueden ofrecer precios ms baratos.  El sitio web www.goodrx.com tiene cupones para medicamentos de Health and safety inspector. Los precios aqu no tienen en cuenta lo que podra costar con la ayuda del seguro (puede ser ms barato con su seguro), pero el sitio web puede darle el precio si no utiliz Tourist information centre manager.  - Puede imprimir el cupn correspondiente y llevarlo con su receta a la farmacia.  - Tambin puede pasar por nuestra oficina durante el horario de atencin regular y Education officer, museum una tarjeta de cupones de GoodRx.  - Si necesita que su receta se enve  electrnicamente a una farmacia diferente, informe a nuestra oficina a travs de MyChart de [place] o por telfono llamando al [phone] y presione la opcin 4.

## 2024-01-12 NOTE — Progress Notes (Signed)
   New Patient Visit   Subjective  Rachel Orr is a 43 y.o. female who presents for the following: eczema. Face, scalp, legs, below breasts, arms, back. Dur: approximately 40 years. No hx of treatment. PCP prescribed Mometasone lotion, did not help only made skin feel greasy. Itching. Legs worsen after shaving.  Does have family Hx of heart disease. Denies any chronic illnesses. Does have hypertension. No family or personal Hx of IBD, Crohn's.   Niece and father have psoriasis.    The following portions of the chart were reviewed this encounter and updated as appropriate: medications, allergies, medical history  Review of Systems:  No other skin or systemic complaints except as noted in HPI or Assessment and Plan.  Objective  Well appearing patient in no apparent distress; mood and affect are within normal limits.  A focused examination was performed of the following areas: Scalp, face, arms, legs, torso  Relevant exam findings are noted in the Assessment and Plan.                     Assessment & Plan   PSORIASIS   Related Procedures Comprehensive metabolic panel CBC with Differential/Platelet Hepatitis B surface antibody,qualitative Hepatitis B surface antigen Hepatitis B core antibody, total Hepatitis C antibody HIV Antibody (routine testing w rflx) QuantiFERON-TB Gold Plus HISTORY OF LONG-TERM TREATMENT WITH HIGH-RISK MEDICATION   Related Procedures Comprehensive metabolic panel CBC with Differential/Platelet Hepatitis B surface antibody,qualitative Hepatitis B surface antigen Hepatitis B core antibody, total Hepatitis C antibody HIV Antibody (routine testing w rflx) QuantiFERON-TB Gold Plus COUNSELING AND COORDINATION OF CARE    PSORIASIS Exam: Well-demarcated erythematous papules/plaques with silvery scale, guttate pink scaly papules on arms legs lower back face. 5-10% BSA. Artificial nails prevent nail exam  Chronic and persistent  condition with duration or expected duration over one year. Condition is symptomatic / bothersome to patient. Not to goal.   Patient C/O joint pain in right wrist, broken years ago  Psoriasis is a chronic non-curable, but treatable genetic/hereditary disease that may have other systemic features affecting other organ systems such as joints (Psoriatic Arthritis). It is associated with an increased risk of inflammatory bowel disease, heart disease, non-alcoholic fatty liver disease, and depression.  Treatments include light and laser treatments; topical medications; and systemic medications including oral and injectables.  Treatment Plan:  Plan to start Cosentyx 300 mg weekly x 5 then monthly pending normal labs and insurance approval.   Reviewed risks of biologics including immunosuppression, infections, injection site reaction, and failure to improve condition. Goal is control of skin condition, not cure.  Some older biologics such as Humira and Enbrel may slightly increase risk of malignancy and may worsen congestive heart failure.  Taltz and Cosentyx may cause inflammatory bowel disease to flare. The use of biologics requires long term medication management, including periodic office visits and monitoring of blood work.   Return in about 3 months (around 04/13/2024) for Psoriasis Follow Up.  I, Lawson Radar, CMA, am acting as scribe for Elie Goody, MD.   Documentation: I have reviewed the above documentation for accuracy and completeness, and I agree with the above.  Elie Goody, MD

## 2024-01-17 LAB — CBC WITH DIFFERENTIAL/PLATELET
Basophils Absolute: 0.1 10*3/uL (ref 0.0–0.2)
Basos: 1 %
EOS (ABSOLUTE): 0.3 10*3/uL (ref 0.0–0.4)
Eos: 2 %
Hematocrit: 41.8 % (ref 34.0–46.6)
Hemoglobin: 14 g/dL (ref 11.1–15.9)
Immature Grans (Abs): 0.1 10*3/uL (ref 0.0–0.1)
Immature Granulocytes: 1 %
Lymphocytes Absolute: 2.7 10*3/uL (ref 0.7–3.1)
Lymphs: 24 %
MCH: 31 pg (ref 26.6–33.0)
MCHC: 33.5 g/dL (ref 31.5–35.7)
MCV: 93 fL (ref 79–97)
Monocytes Absolute: 0.8 10*3/uL (ref 0.1–0.9)
Monocytes: 7 %
Neutrophils Absolute: 7.6 10*3/uL — ABNORMAL HIGH (ref 1.4–7.0)
Neutrophils: 65 %
Platelets: 421 10*3/uL (ref 150–450)
RBC: 4.51 x10E6/uL (ref 3.77–5.28)
RDW: 13.1 % (ref 11.7–15.4)
WBC: 11.5 10*3/uL — ABNORMAL HIGH (ref 3.4–10.8)

## 2024-01-17 LAB — COMPREHENSIVE METABOLIC PANEL
ALT: 18 IU/L (ref 0–32)
AST: 20 IU/L (ref 0–40)
Albumin: 4.2 g/dL (ref 3.9–4.9)
Alkaline Phosphatase: 120 IU/L (ref 44–121)
BUN/Creatinine Ratio: 18 (ref 9–23)
BUN: 11 mg/dL (ref 6–24)
Bilirubin Total: 0.3 mg/dL (ref 0.0–1.2)
CO2: 23 mmol/L (ref 20–29)
Calcium: 9 mg/dL (ref 8.7–10.2)
Chloride: 103 mmol/L (ref 96–106)
Creatinine, Ser: 0.6 mg/dL (ref 0.57–1.00)
Globulin, Total: 2.7 g/dL (ref 1.5–4.5)
Glucose: 83 mg/dL (ref 70–99)
Potassium: 4 mmol/L (ref 3.5–5.2)
Sodium: 140 mmol/L (ref 134–144)
Total Protein: 6.9 g/dL (ref 6.0–8.5)
eGFR: 116 mL/min/{1.73_m2} (ref >59)

## 2024-01-17 LAB — QUANTIFERON-TB GOLD PLUS
QuantiFERON Mitogen Value: >10 [IU]/mL
QuantiFERON Nil Value: 0.02 [IU]/mL
QuantiFERON TB1 Ag Value: 0.03 [IU]/mL
QuantiFERON TB2 Ag Value: 0.03 [IU]/mL
QuantiFERON-TB Gold Plus: NEGATIVE

## 2024-01-17 LAB — HEPATITIS B SURFACE ANTIGEN: Hepatitis B Surface Ag: NEGATIVE

## 2024-01-17 LAB — HIV ANTIBODY (ROUTINE TESTING W REFLEX): HIV Screen 4th Generation wRfx: NONREACTIVE

## 2024-01-17 LAB — HEPATITIS B SURFACE ANTIBODY,QUALITATIVE: Hep B Surface Ab, Qual: REACTIVE

## 2024-01-17 LAB — HEPATITIS C ANTIBODY: Hep C Virus Ab: NONREACTIVE

## 2024-01-17 LAB — HEPATITIS B CORE ANTIBODY, TOTAL: Hep B Core Total Ab: NEGATIVE

## 2024-01-19 ENCOUNTER — Encounter: Payer: Self-pay | Admitting: Dermatology

## 2024-01-20 ENCOUNTER — Telehealth: Payer: Self-pay

## 2024-01-20 MED ORDER — COSENTYX UNOREADY 300 MG/2ML ~~LOC~~ SOAJ: 300.0000 mg | SUBCUTANEOUS | 0 refills | Status: DC

## 2024-01-20 MED ORDER — COSENTYX UNOREADY 300 MG/2ML ~~LOC~~ SOAJ: 300.0000 mg | SUBCUTANEOUS | 4 refills | Status: DC

## 2024-01-20 NOTE — Telephone Encounter (Signed)
 Patient would like for you to call her; her blood pressure is 145/85 and she is concerned.

## 2024-01-20 NOTE — Telephone Encounter (Signed)
 Patient notified of labs via MyChart message from Dr. Katrinka Blazing.  Cosentyx 300 mg loading dose and maintenance dose sent to [facility].

## 2024-01-20 NOTE — Telephone Encounter (Signed)
 Left message to call me in am, but 145/85 not bad

## 2024-01-20 NOTE — Telephone Encounter (Signed)
-----   Message from [place] sent at 01/19/2024  8:04 PM EDT ----- Please send Cosentyx for psoriasis 300 mg weekly x 5 weeks then 300 mg every 4 weeks

## 2024-02-02 ENCOUNTER — Telehealth: Payer: Self-pay

## 2024-02-02 NOTE — Telephone Encounter (Signed)
 Medicaid is giving kickback for Cosentyx coverage. Can you give explanation as to why patient can not try and fail Soriatane, Methotrexate or Cyclosporine?

## 2024-02-03 ENCOUNTER — Ambulatory Visit: Payer: Medicaid Other | Admitting: *Deleted

## 2024-02-03 VITALS — BP 145/97

## 2024-02-03 DIAGNOSIS — Z3202 Encounter for pregnancy test, result negative: Secondary | ICD-10-CM | POA: Diagnosis not present

## 2024-02-03 DIAGNOSIS — Z3042 Encounter for surveillance of injectable contraceptive: Secondary | ICD-10-CM | POA: Diagnosis not present

## 2024-02-03 LAB — POCT URINE PREGNANCY: Preg Test, Ur: NEGATIVE

## 2024-02-03 MED ORDER — MEDROXYPROGESTERONE ACETATE 150 MG/ML IM SUSY
150.0000 mg | PREFILLED_SYRINGE | Freq: Once | INTRAMUSCULAR | Status: AC
  Administered 2024-02-03: 150 mg via INTRAMUSCULAR

## 2024-02-03 NOTE — Progress Notes (Signed)
   NURSE VISIT- INJECTION  SUBJECTIVE:  Rachel Orr is a 43 y.o. [id number] female here for a Depo Provera for contraception/period management. She is a GYN patient.   OBJECTIVE:  BP (!) 145/97 (BP Location: Right Arm, Patient Position: Sitting, Cuff Size: Normal)   Appears well, in no apparent distress  Injection administered in: Right deltoid  Meds ordered this encounter  Medications   medroxyPROGESTERone Acetate SUSY 150 mg    ASSESSMENT: GYN patient Depo Provera for contraception/period management PLAN: Follow-up: in 11-13 weeks for next Depo. Keep a check on BP; if has headaches or blurred vision, let us know.    Malachy Mood  02/03/2024 4:18 PM

## 2024-02-04 NOTE — Telephone Encounter (Signed)
 Information sent to [facility] to continue with appeal. aw

## 2024-02-18 ENCOUNTER — Telehealth: Payer: Self-pay

## 2024-02-18 NOTE — Telephone Encounter (Signed)
 Left voicemail for patient to return my call. Cosentyx  denied by insurance and appeal started, but insurance requires patient to sign Authorization of Representation (AOR) form. Form scanned into patient's chart awaiting signature.

## 2024-02-25 ENCOUNTER — Ambulatory Visit: Admitting: Adult Health

## 2024-02-25 ENCOUNTER — Encounter: Payer: Self-pay | Admitting: Adult Health

## 2024-02-25 VITALS — BP 130/78 | HR 95 | Ht 64.0 in | Wt 156.0 lb

## 2024-02-25 DIAGNOSIS — F419 Anxiety disorder, unspecified: Secondary | ICD-10-CM

## 2024-02-25 DIAGNOSIS — F32A Depression, unspecified: Secondary | ICD-10-CM | POA: Diagnosis not present

## 2024-02-25 DIAGNOSIS — M79644 Pain in right finger(s): Secondary | ICD-10-CM

## 2024-02-25 DIAGNOSIS — I10 Essential (primary) hypertension: Secondary | ICD-10-CM

## 2024-02-25 MED ORDER — AMLODIPINE BESYLATE 5 MG PO TABS: 5.0000 mg | ORAL_TABLET | Freq: Every day | ORAL | 6 refills | Status: AC

## 2024-02-25 NOTE — Progress Notes (Signed)
 Subjective:     Patient ID: Rachel Orr, female   DOB: 10-02-1981, 43 y.o.   MRN: [id number]  HPI Rachel Orr is a 43 year old white female, single, G5P5005, back in follow up on BP and anxiety and depression. She says BP better, but anxiety and depression the same, has appt with [facility] 03/01/24. She says right third finger hurts and hand feels numb at times.     Component Value Date/Time   DIAGPAP  02/24/2023 1514    - Negative for intraepithelial lesion or malignancy (NILM)   DIAGPAP  03/01/2020 1045    - Negative for intraepithelial lesion or malignancy (NILM)   HPVHIGH Negative 02/24/2023 1514   HPVHIGH Negative 03/01/2020 1045   ADEQPAP  02/24/2023 1514    Satisfactory for evaluation; transformation zone component PRESENT.   ADEQPAP  03/01/2020 1045    Satisfactory for evaluation; transformation zone component ABSENT.   No current PCP  Review of Systems +anxiety and depression Denies any headaches Has pain right third finger and hand feels numb at times, when driving Reviewed past medical,surgical, social and family history. Reviewed medications and allergies.     Objective:   Physical Exam BP 130/78 (BP Location: Left Arm, Patient Position: Sitting, Cuff Size: Normal)   Pulse 95   Ht 5\' 4"  (1.626 m)   Wt 156 lb (70.8 kg)   BMI 26.78 kg/m     Skin warm and dry. Lungs: clear to ausculation bilaterally. Cardiovascular: regular rate and rhythm.  Right third finger does have  Fall risk is low    02/25/2024    3:31 PM 12/31/2023    3:53 PM 12/03/2023    2:50 PM  Depression screen PHQ 2/9  Decreased Interest 1 2 3   Down, Depressed, Hopeless 3 2 3   PHQ - 2 Score 4 4 6   Altered sleeping 3 0 3  Tired, decreased energy 3 2 3   Change in appetite 2 2 1   Feeling bad or failure about yourself  3 0 3  Trouble concentrating 3 2 3   Moving slowly or fidgety/restless 3 2 3   Suicidal thoughts 0 0 0  PHQ-9 Score 21 12 22   Difficult doing work/chores Extremely dIfficult         02/25/2024     3:33 PM 12/31/2023    3:54 PM 12/03/2023    2:51 PM 02/24/2023    3:02 PM  GAD 7 : Generalized Anxiety Score  Nervous, Anxious, on Edge 3 3 3 3   Control/stop worrying 3 2 3 3   Worry too much - different things 3 2 3 3   Trouble relaxing 3 2 3 3   Restless 3 2 3 3   Easily annoyed or irritable 3 3 3 3   Afraid - awful might happen 0 0 0 0  Total GAD 7 Score 18 14 18 18       Upstream - 02/25/24 1537       Pregnancy Intention Screening   Does the patient want to become pregnant in the next year? No    Does the patient's partner want to become pregnant in the next year? No    Would the patient like to discuss contraceptive options today? No      Contraception Wrap Up   Current Method Hormonal Injection    End Method Hormonal Injection    Contraception Counseling Provided Yes             Assessment:     1. Hypertension, unspecified type (Primary) BP is better  Continue norvasc  5 mg 1 daily  will refill Meds ordered this encounter  Medications   amLODipine  (NORVASC ) 5 MG tablet    Sig: Take 1 tablet (5 mg total) by mouth daily.    Dispense:  30 tablet    Refill:  6    Supervising Provider:   Evalyn Hillier H [[id number]]   Follow up in 3 month for BP check and ROS   2. Anxiety and depression She says anxiety and depression the same  She is taking zoloft  50 mg 1 daily and has hydroxyzine  prn Has appt with [facility] 03/01/24 she says   3. Pain of finger of right hand Has pain right third finger for a while Will refer to orthopedic for evaluation  - Ambulatory referral to Orthopedic Surgery     Plan:    Try to get PCP in [place] that sister sees  Follow up in 3 months for ROS and BP check

## 2024-03-01 ENCOUNTER — Ambulatory Visit: Admitting: Psychiatry

## 2024-03-17 ENCOUNTER — Ambulatory Visit: Admitting: Orthopedic Surgery

## 2024-03-17 ENCOUNTER — Encounter: Payer: Self-pay | Admitting: Orthopedic Surgery

## 2024-03-17 VITALS — BP 134/88 | HR 88

## 2024-03-17 DIAGNOSIS — R202 Paresthesia of skin: Secondary | ICD-10-CM

## 2024-03-17 DIAGNOSIS — M65331 Trigger finger, right middle finger: Secondary | ICD-10-CM | POA: Diagnosis not present

## 2024-03-17 NOTE — Patient Instructions (Signed)

## 2024-03-17 NOTE — Progress Notes (Signed)
 New Patient Visit  Assessment: Rachel Orr is a 43 y.o. female with the following: 1. Trigger finger, right middle finger 2. Paresthesia of skin   Plan: Symptoms and physical exam most consistent with trigger finger.  We discussed etiology, and potential treatment options.  Surgery was discussed in detail, including the plan for procedure, and expected recovery.  After discussing these options, the patient would like to proceed with a steroid injection.  This was completed in clinic today.  Depending on the efficacy of the injection, we could consider another injection.  However, if the injection does not provide sustained relief, I would recommend surgery.  All of this was discussed with the patient, and they are amenable to this plan.    In addition, she has numbness and tingling in both hands.  Provocative testing demonstrates irritation of the median nerve.  This involves the fingers within the median nerve distribution.  I would like to obtain bilateral EMGs for further evaluation.  Follow-up once the testing is complete.   Procedure note injection - Right Long finger A1 Pulley  Verbal consent was obtained to inject the Right Long finger A1 pulley Timeout was completed to confirm the site of injection.  The skin was prepped with alcohol and ethyl chloride was sprayed at the injection site.  A 21-gauge needle was used to inject 40 mg of Depo-Medrol and 1% lidocaine  (1 cc) into the Right Long finger using a direct anterior approach.  There were no complications. Patient tolerated the procedure well. A sterile bandage was applied     Follow-up: Return for After EMG.  Subjective:  Chief Complaint  Patient presents with   Hand Pain    Bilat hand numbness, R middle finger pops and pain for 2 mos.     History of Present Illness: Rachel Orr is a 43 y.o. female who presents for evaluation of Right Long finger pain.  She notes some popping sensations in the right long finger.   In addition, she has progressively worsening numbness and tingling in bilateral hands.  When she is driving for long periods of time, she notes numbness in both hands.  She also wakes up at night, shaking out her hands.  No specific injuries.  The catching sensations have been ongoing for couple months.     Review of Systems: No fevers or chills + numbness or tingling No chest pain No shortness of breath No bowel or bladder dysfunction No GI distress No headaches   Medical History:  Past Medical History:  Diagnosis Date   Anemia    Hypertension     Past Surgical History:  Procedure Laterality Date   NO PAST SURGERIES      Family History  Problem Relation Age of Onset   Diabetes Paternal Grandmother    Hypertension Paternal Grandmother    Asthma Paternal Grandmother    COPD Paternal Grandmother    Hypertension Father    COPD Father    Hypertension Sister    Heart attack Sister    Heart disease Sister    Anorexia nervosa Sister    Bipolar disorder Sister    Asthma Son    Hypertension Son    Hypertension Paternal Uncle    Social History   Tobacco Use   Smoking status: Every Day    Current packs/day: 1.00    Average packs/day: 1 pack/day for 20.0 years (20.0 ttl pk-yrs)    Types: Cigarettes   Smokeless tobacco: Never  Vaping Use  Vaping status: Never Used  Substance Use Topics   Alcohol use: No   Drug use: No    No Known Allergies  Current Meds  Medication Sig   amLODipine  (NORVASC ) 5 MG tablet Take 1 tablet (5 mg total) by mouth daily.   hydrOXYzine  (VISTARIL ) 25 MG capsule Take 1 capsule (25 mg total) by mouth 3 (three) times daily as needed.   medroxyPROGESTERone  Acetate 150 MG/ML SUSY INJECT 1 ML INTO THE MUSCLE EVERY 3 MONTHS   sertraline  (ZOLOFT ) 50 MG tablet Take 1 tablet (50 mg total) by mouth daily.    Objective: BP 134/88   Pulse 88   Physical Exam:  General: Alert and oriented. and No acute distress. Gait: Normal gait.  Bilateral  hands of the deformity.  No atrophy is appreciated.  Positive Tinel's.  Positive Phalen's.  Positive carpal tunnel compression.  She has tenderness to palpation over the A1 pulley to the long finger.  No active triggering is appreciated in clinic today.  Slightly decreased sensation within the median nerve distribution bilaterally.  IMAGING: No new imaging obtained today   New Medications:  No orders of the defined types were placed in this encounter.     Rachel Frater, MD  03/17/2024 4:09 PM

## 2024-03-17 NOTE — Addendum Note (Signed)
 Addended by: Marti Slates on: 03/17/2024 04:12 PM   Modules accepted: Orders

## 2024-03-18 ENCOUNTER — Ambulatory Visit (INDEPENDENT_AMBULATORY_CARE_PROVIDER_SITE_OTHER)

## 2024-03-18 ENCOUNTER — Telehealth: Payer: Self-pay | Admitting: Physical Medicine and Rehabilitation

## 2024-03-18 DIAGNOSIS — L409 Psoriasis, unspecified: Secondary | ICD-10-CM | POA: Diagnosis not present

## 2024-03-18 DIAGNOSIS — Z79899 Other long term (current) drug therapy: Secondary | ICD-10-CM

## 2024-03-18 MED ORDER — SECUKINUMAB 300 MG/2ML ~~LOC~~ SOAJ
300.0000 mg | Freq: Once | SUBCUTANEOUS | Status: AC
  Administered 2024-03-18: 300 mg via SUBCUTANEOUS

## 2024-03-18 NOTE — Progress Notes (Signed)
 Patient here today for Cosentyx  injection 300mg /60mL loading dose #1 for psoriasis. Patient accompanied by sister today and stated her sister will be the one administering her injections at home, demonstrated patient and sister and both voiced understanding on how to administer injections.    Cosentyx  pen injected into the right anterior thigh. Patient tolerated injection well.   NDC: 4010-2725-36 Lot: [id number] Exp: 09/26/2025  Rachel Orr V. Grier Leber, CMA

## 2024-03-18 NOTE — Telephone Encounter (Signed)
 Patient called. Would like an appointment with Dr. Alvester Morin

## 2024-04-02 ENCOUNTER — Ambulatory Visit: Admitting: Family Medicine

## 2024-04-05 ENCOUNTER — Other Ambulatory Visit: Payer: Self-pay

## 2024-04-05 ENCOUNTER — Ambulatory Visit: Payer: Self-pay

## 2024-04-05 ENCOUNTER — Emergency Department (HOSPITAL_COMMUNITY)
Admission: EM | Admit: 2024-04-05 | Discharge: 2024-04-05 | Discharge status: Against Medical Advice | Attending: Emergency Medicine | Admitting: Emergency Medicine

## 2024-04-05 ENCOUNTER — Encounter (HOSPITAL_COMMUNITY): Payer: Self-pay

## 2024-04-05 DIAGNOSIS — M79621 Pain in right upper arm: Secondary | ICD-10-CM | POA: Diagnosis present

## 2024-04-05 DIAGNOSIS — Z5321 Procedure and treatment not carried out due to patient leaving prior to being seen by health care provider: Secondary | ICD-10-CM | POA: Insufficient documentation

## 2024-04-05 NOTE — Telephone Encounter (Signed)
 FYI Only or Action Required?: FYI only for provider  Called Nurse Triage reporting Arm Pain. Symptoms began a week ago. Interventions attempted: Rest, hydration, or home remedies. Symptoms are: unchanged.  Triage Disposition: See PCP When Office is Open (Within 3 Days)  Patient/caregiver understands and will follow disposition?: No   Copied from CRM [id number]. Topic: Clinical - Red Word Triage >> Apr 05, 2024 11:23 AM Stanly Early wrote: Pain in right arm, she stated its really bad. Patient went to urgent care to get a shot but it made her really dizzy Reason for Disposition  [1] MODERATE pain (e.g., interferes with normal activities) AND [2] present > 3 days  Answer Assessment - Initial Assessment Questions 1. ONSET: "When did the pain start?"     About a week ago 2. LOCATION: "Where is the pain located?"     R arm, shoulder, neck area 3. PAIN: "How bad is the pain?" (Scale 1-10; or mild, moderate, severe)   - MILD (1-3): Doesn't interfere with normal activities.   - MODERATE (4-7): Interferes with normal activities (e.g., work or school) or awakens from sleep.   - SEVERE (8-10): Excruciating pain, unable to do any normal activities, unable to hold a cup of water.     10 4. WORK OR EXERCISE: "Has there been any recent work or exercise that involved this part of the body?"     States she woke with the pain one morning about a week ago 5. CAUSE: "What do you think is causing the arm pain?"     unsure 6. OTHER SYMPTOMS: "Do you have any other symptoms?" (e.g., neck pain, swelling, rash, fever, numbness, weakness)     Numbness,  7. PREGNANCY: "Is there any chance you are pregnant?" "When was your last menstrual period?"     denies  Pt states she was seen in UC and given shot of pain meds and muscle relaxer's. Pt states that from elbow up is going numb.  Protocols used: Arm Pain-A-AH

## 2024-04-05 NOTE — ED Triage Notes (Signed)
 Pt arrived via POV c/o recurrent right upper arm pain X 1 week. Pt reports trying heating pad w/o relief. Pt reports going to urgent care recently and being given a shot of pain medicine and prescribed muscle relaxer w/o relief. Pt reports pain is severe and called her doctor and was advised to come ot the ER for evaluation.

## 2024-04-06 ENCOUNTER — Ambulatory Visit
Admission: RE | Admit: 2024-04-06 | Discharge: 2024-04-06 | Disposition: A | Discharge status: Home / Self Care | Attending: Family Medicine | Admitting: Family Medicine

## 2024-04-06 ENCOUNTER — Encounter: Payer: Self-pay | Admitting: Family Medicine

## 2024-04-06 ENCOUNTER — Ambulatory Visit
Admission: RE | Admit: 2024-04-06 | Discharge: 2024-04-06 | Disposition: A | Discharge status: Home / Self Care | Source: Ambulatory Visit | Attending: Family Medicine | Admitting: Family Medicine

## 2024-04-06 ENCOUNTER — Ambulatory Visit (INDEPENDENT_AMBULATORY_CARE_PROVIDER_SITE_OTHER): Admitting: Family Medicine

## 2024-04-06 VITALS — BP 122/76 | HR 98 | Ht 64.0 in | Wt 169.0 lb

## 2024-04-06 DIAGNOSIS — F32A Depression, unspecified: Secondary | ICD-10-CM

## 2024-04-06 DIAGNOSIS — M25511 Pain in right shoulder: Secondary | ICD-10-CM

## 2024-04-06 DIAGNOSIS — Z8249 Family history of ischemic heart disease and other diseases of the circulatory system: Secondary | ICD-10-CM | POA: Diagnosis not present

## 2024-04-06 DIAGNOSIS — F419 Anxiety disorder, unspecified: Secondary | ICD-10-CM

## 2024-04-06 DIAGNOSIS — L409 Psoriasis, unspecified: Secondary | ICD-10-CM

## 2024-04-06 DIAGNOSIS — Z136 Encounter for screening for cardiovascular disorders: Secondary | ICD-10-CM

## 2024-04-06 DIAGNOSIS — F17200 Nicotine dependence, unspecified, uncomplicated: Secondary | ICD-10-CM

## 2024-04-06 DIAGNOSIS — Z13220 Encounter for screening for lipoid disorders: Secondary | ICD-10-CM

## 2024-04-06 DIAGNOSIS — I10 Essential (primary) hypertension: Secondary | ICD-10-CM | POA: Diagnosis not present

## 2024-04-06 DIAGNOSIS — Z131 Encounter for screening for diabetes mellitus: Secondary | ICD-10-CM

## 2024-04-06 DIAGNOSIS — F17210 Nicotine dependence, cigarettes, uncomplicated: Secondary | ICD-10-CM

## 2024-04-06 MED ORDER — NAPROXEN 500 MG PO TABS: 500.0000 mg | ORAL_TABLET | Freq: Two times a day (BID) | ORAL | 0 refills | Status: DC

## 2024-04-06 MED ORDER — SERTRALINE HCL 100 MG PO TABS: 100.0000 mg | ORAL_TABLET | Freq: Every day | ORAL | 0 refills | Status: DC

## 2024-04-06 NOTE — Progress Notes (Unsigned)
 New Patient Office Visit  Subjective    Patient ID: Rachel Orr, female    DOB: 06-11-1981  Age: 43 y.o. MRN: [id number]  CC:  Chief Complaint  Patient presents with   Neck Pain    Right neck pain radiating into shoulder and down into arm down into elbow. Tingling and numbness down in arm. Aching. Stabbing pain. Patient went to [facility] ER yesterday but waited to long so pt left. X1 week.   Anxiety   Depression     Assessment & Plan:   Right shoulder pain, unspecified chronicity Assessment & Plan: Likely secondary to pinched nerve, cervical x-ray ordered, recommend naproxen 500 mg twice daily for 5 days and then as needed.  Patient was referred to orthopedics by GYN.  Orders: -     DG Cervical Spine Complete; Future -     Naproxen; Take 1 tablet (500 mg total) by mouth 2 (two) times daily with a meal.  Dispense: 30 tablet; Refill: 0  Primary hypertension Assessment & Plan: Blood pressure at goal, continue amlodipine  5 mg daily.  Recommend DASH diet and daily exercise.   Anxiety and depression Assessment & Plan: Increasing Zoloft  200 mg daily, follow-up with psychiatry and patient already scheduled.  Orders: -     Sertraline  HCl; Take 1 tablet (100 mg total) by mouth daily.  Dispense: 90 tablet; Refill: 0 -     TSH  Family history of MI (myocardial infarction)  Psoriasis Assessment & Plan: Patient recently started on Cosentyx , follow-up with dermatology.   Diabetes mellitus screening -     Hemoglobin A1c  Cigarette nicotine dependence without complication  Encounter for lipid screening for cardiovascular disease -     Lipid panel  Smoker Assessment & Plan: Patient started using NRT:   nicotine patches      Return in about 3 months (around 07/07/2024) for chronic follow up with PCP.   Rachel K Taj Nevins, MD   43 y/o female with PMH of depression, anxiety, hypertension, psoriasis,nicotine dependence presents to the clinic for establish care and talk  about shoulder pain.  Complaining of right shoulder pain which is located posteriorly.  Denies any fall or injury. Started week ago. Radiates in to her arm and elbow. Was seen in ER left before eval.   Psoriasis:  Started on Cosyntex recently.   Depression/Anxiety:  Stable.  On Zoloft . She is scheduled with Psychiatry.  Hypertension:  On Amlodipine .       Rachel Orr presents to establish care   Outpatient Encounter Medications as of 04/06/2024  Medication Sig   amLODipine  (NORVASC ) 5 MG tablet Take 1 tablet (5 mg total) by mouth daily.   hydrOXYzine  (VISTARIL ) 25 MG capsule Take 1 capsule (25 mg total) by mouth 3 (three) times daily as needed.   medroxyPROGESTERone  Acetate 150 MG/ML SUSY INJECT 1 ML INTO THE MUSCLE EVERY 3 MONTHS   naproxen (NAPROSYN) 500 MG tablet Take 1 tablet (500 mg total) by mouth 2 (two) times daily with a meal.   Secukinumab , 300 MG Dose, (COSENTYX , 300 MG DOSE,) 150 MG/ML SOSY Inject 300 mg into the skin once a week.   sertraline  (ZOLOFT ) 100 MG tablet Take 1 tablet (100 mg total) by mouth daily.   [DISCONTINUED] sertraline  (ZOLOFT ) 50 MG tablet Take 1 tablet (50 mg total) by mouth daily.   [DISCONTINUED] Secukinumab  (COSENTYX  UNOREADY) 300 MG/2ML SOAJ Inject 300 mg into the skin every 28 (twenty-eight) days. (Patient not taking: Reported on 03/17/2024)   [  DISCONTINUED] sertraline  (ZOLOFT ) 25 MG tablet PLEASE SEE ATTACHED FOR DETAILED DIRECTIONS (Patient not taking: Reported on 12/03/2023)   No facility-administered encounter medications on file as of 04/06/2024.      Review of Systems  All other systems reviewed and are negative.       Objective    BP 122/76   Pulse 98   Ht 5' 4 (1.626 m)   Wt 169 lb (76.7 kg)   SpO2 100%   BMI 29.01 kg/m   Physical Exam Vitals and nursing note reviewed.  Constitutional:      Appearance: Normal appearance.  HENT:     Head: Normocephalic.     Right Ear: External ear normal.     Left Ear:  External ear normal.  Eyes:     Conjunctiva/sclera: Conjunctivae normal.  Cardiovascular:     Rate and Rhythm: Normal rate.  Pulmonary:     Effort: Pulmonary effort is normal. No respiratory distress.  Abdominal:     Palpations: Abdomen is soft.  Musculoskeletal:        General: Normal range of motion.  Skin:    General: Skin is warm.  Neurological:     Mental Status: She is alert and oriented to person, place, and time.  Psychiatric:        Mood and Affect: Mood normal.

## 2024-04-07 ENCOUNTER — Ambulatory Visit: Payer: Self-pay | Admitting: Family Medicine

## 2024-04-07 ENCOUNTER — Other Ambulatory Visit: Payer: Self-pay | Admitting: Adult Health

## 2024-04-07 ENCOUNTER — Other Ambulatory Visit: Payer: Self-pay | Admitting: Family Medicine

## 2024-04-07 ENCOUNTER — Encounter: Payer: Self-pay | Admitting: Family Medicine

## 2024-04-07 DIAGNOSIS — M25511 Pain in right shoulder: Secondary | ICD-10-CM | POA: Insufficient documentation

## 2024-04-07 DIAGNOSIS — E785 Hyperlipidemia, unspecified: Secondary | ICD-10-CM

## 2024-04-07 LAB — HEMOGLOBIN A1C
Est. average glucose Bld gHb Est-mCnc: 111 mg/dL
Hgb A1c MFr Bld: 5.5 % (ref 4.8–5.6)

## 2024-04-07 LAB — LIPID PANEL
Chol/HDL Ratio: 7.4 ratio — ABNORMAL HIGH (ref 0.0–4.4)
Cholesterol, Total: 265 mg/dL — ABNORMAL HIGH (ref 100–199)
HDL: 36 mg/dL — ABNORMAL LOW (ref >39)
LDL Chol Calc (NIH): 188 mg/dL — ABNORMAL HIGH (ref 0–99)
Triglycerides: 213 mg/dL — ABNORMAL HIGH (ref 0–149)
VLDL Cholesterol Cal: 41 mg/dL — ABNORMAL HIGH (ref 5–40)

## 2024-04-07 LAB — TSH: TSH: 1.61 u[IU]/mL (ref 0.450–4.500)

## 2024-04-07 MED ORDER — ROSUVASTATIN CALCIUM 10 MG PO TABS: 10.0000 mg | ORAL_TABLET | Freq: Every day | ORAL | 3 refills | Status: AC

## 2024-04-07 MED ORDER — METHYLPREDNISOLONE 4 MG PO TBPK: ORAL_TABLET | ORAL | 0 refills | Status: DC

## 2024-04-07 NOTE — Assessment & Plan Note (Signed)
 Likely secondary to pinched nerve, cervical x-ray ordered, recommend naproxen 500 mg twice daily for 5 days and then as needed.  Patient was referred to orthopedics by GYN.

## 2024-04-07 NOTE — Assessment & Plan Note (Signed)
 Patient started using NRT:   nicotine patches

## 2024-04-07 NOTE — Assessment & Plan Note (Signed)
 Patient recently started on Cosentyx , follow-up with dermatology.

## 2024-04-07 NOTE — Assessment & Plan Note (Signed)
 Increasing Zoloft  200 mg daily, follow-up with psychiatry and patient already scheduled.

## 2024-04-07 NOTE — Assessment & Plan Note (Signed)
 Blood pressure at goal, continue amlodipine  5 mg daily.  Recommend DASH diet and daily exercise.

## 2024-04-07 NOTE — Telephone Encounter (Signed)
Please review patients response.

## 2024-04-08 ENCOUNTER — Other Ambulatory Visit: Payer: Self-pay | Admitting: Family Medicine

## 2024-04-08 ENCOUNTER — Telehealth: Payer: Self-pay

## 2024-04-08 DIAGNOSIS — M5412 Radiculopathy, cervical region: Secondary | ICD-10-CM

## 2024-04-08 LAB — SPECIMEN STATUS REPORT

## 2024-04-08 LAB — LIPOPROTEIN A (LPA): Lipoprotein (a): <8.4 nmol/L (ref <75.0)

## 2024-04-08 NOTE — Telephone Encounter (Signed)
 Patient called and patient says she sent a picture in MyChart. The encounter dated 04/07/24 Results Follow-Up has the picture and the advise by Dr. Norma Beckers regarding this question. Patient advised and she verbalized understanding. She also asks does she need to take all of the prednisone, advised it's best to finish all the medication. She asks is it ok to take a baby aspirin daily with all of the medications, advised yes it's ok to take a 81 mg aspirin daily. She verbalized understanding.   Thank you for your message seeking medical advice.* My assessment and recommendation are as follows: Rosuvastatin can be taken with medication in picture, yes you can continue taking methyl prednisone.   Sincerely,  Oneta Bilberry, MD    Copied from CRM [id number]. Topic: Clinical - Medication Question >> Apr 08, 2024 11:37 AM Rachel Orr wrote: Reason for CRM: Patient is calling in because she took extra strength headache medicine that had acetaminophen  and nsaids in it. She wants to know if it's okay to take her medicine methylPREDNISolone (MEDROL DOSEPAK) 4 MG TBPK tablet [[id number]] and rosuvastatin (CRESTOR) 10 MG tablet [[id number]] with the headache medicine.

## 2024-04-09 NOTE — Telephone Encounter (Signed)
 Please review and advise patient.   JM

## 2024-04-12 ENCOUNTER — Other Ambulatory Visit: Payer: Self-pay

## 2024-04-12 ENCOUNTER — Ambulatory Visit (INDEPENDENT_AMBULATORY_CARE_PROVIDER_SITE_OTHER): Admitting: Dermatology

## 2024-04-12 ENCOUNTER — Encounter: Payer: Self-pay | Admitting: Dermatology

## 2024-04-12 DIAGNOSIS — Z79899 Other long term (current) drug therapy: Secondary | ICD-10-CM | POA: Diagnosis not present

## 2024-04-12 DIAGNOSIS — M25511 Pain in right shoulder: Secondary | ICD-10-CM

## 2024-04-12 DIAGNOSIS — Z7189 Other specified counseling: Secondary | ICD-10-CM

## 2024-04-12 DIAGNOSIS — L409 Psoriasis, unspecified: Secondary | ICD-10-CM | POA: Diagnosis not present

## 2024-04-12 MED ORDER — NAPROXEN 500 MG PO TABS: 500.0000 mg | ORAL_TABLET | Freq: Two times a day (BID) | ORAL | 0 refills | Status: AC

## 2024-04-12 NOTE — Progress Notes (Signed)
   Follow-Up Visit   Subjective  Rachel Orr is a 43 y.o. female who presents for the following: Psoriasis - pt currently on Cosentyx  300 mg pt on her 4th loading dose, and due for the 5th this week. Pt states no side effects from Cosentyx . Pt recently prescribed Prednisone taper for possible arthritis in the R shoulder, she has an appointment scheduled on 04/15/24 with Lucetta Russel PA-C in neurosurgery.  The following portions of the chart were reviewed this encounter and updated as appropriate: medications, allergies, medical history  Review of Systems:  No other skin or systemic complaints except as noted in HPI or Assessment and Plan.  Objective  Well appearing patient in no apparent distress; mood and affect are within normal limits.  Areas Examined: The face, arms, back, and abdomen  Relevant exam findings are noted in the Assessment and Plan.      Assessment & Plan   PSORIASIS   LONG-TERM USE OF HIGH-RISK MEDICATION   COUNSELING AND COORDINATION OF CARE   MEDICATION MANAGEMENT    PSORIASIS, clear on Cosentyx , finished loading doses Exam: postinflammatory erythematous macules on back chest forearms. 0% BSA improved from 5-10%.  01/13/24 quant gold negative  Chronic and persistent condition with duration or expected duration over one year. Well-controlled and at goal   Pt c/o joint pain in shoulder for which she is seeing [facility] on 04/15/24.  Treatment Plan: Continue Cosentyx  300 mg SQ Q4W after finishing loading doses (pt injecting at home). Reviewed risks of biologics including immunosuppression, infections, injection site reaction, and failure to improve condition. Goal is control of skin condition, not cure.  Some older biologics such as Humira and Enbrel may slightly increase risk of malignancy and may worsen congestive heart failure.  Taltz and Cosentyx  may cause inflammatory bowel disease to flare. The use of biologics requires long term  medication management, including periodic office visits and monitoring of blood work.  Counseling on psoriasis and coordination of care  psoriasis is a chronic non-curable, but treatable genetic/hereditary disease that may have other systemic features affecting other organ systems such as joints (Psoriatic Arthritis). It is associated with an increased risk of inflammatory bowel disease, heart disease, non-alcoholic fatty liver disease, and depression.  Treatments include light and laser treatments; topical medications; and systemic medications including oral and injectables.  Return in about 3 months (around 07/13/2024) for psoriasis follow up.  Arlinda Lais, CMA, am acting as scribe for Harris Liming, MD .   Documentation: I have reviewed the above documentation for accuracy and completeness, and I agree with the above.  Harris Liming, MD

## 2024-04-12 NOTE — Patient Instructions (Signed)

## 2024-04-12 NOTE — Telephone Encounter (Signed)
 She can continue Naprosyn , follow up with Neuro Surgery on 19th.

## 2024-04-12 NOTE — Telephone Encounter (Signed)
 Please review and advise.   JM

## 2024-04-13 NOTE — Progress Notes (Unsigned)
 Referring Physician:  Kotturi, Vinay K, MD [address] [place],  [place] [address]  Primary Physician:  Kotturi, Vinay K, MD  History of Present Illness: 04/15/2024 [name] has a history of HTN, psoriasis, anxiety, depression, bilateral carpal tunnel syndrome, sickle cell anemia?Rachel Orr   2-3 week history of neck pain that radiates down right arm to the elbow. Current pain is more in right shoulder and right wrist. She has intermittent numbness and tingling in both hands. She has weakness in her right arm. Pain is worse at night and with moving her right arm. No known injury.   Given medrol  dose pack on 04/07/24 with some relief when taking it. She is taking naproxen .   She smokes 1 and 1/2 PPD x 26 years.   Bowel/Bladder Dysfunction: none  Conservative measures:  Physical therapy: has not participated in PT  Multimodal medical therapy including regular antiinflammatories: Naproxen   Injections: No epidural steroid injections  Past Surgery: none  Rachel Orr has no symptoms of cervical myelopathy.  The symptoms are causing a significant impact on the patient's life.   Review of Systems:  A 10 point review of systems is negative, except for the pertinent positives and negatives detailed in the HPI.  Past Medical History: Past Medical History:  Diagnosis Date   Anemia    Anxiety    All my life   Arthritis    Most of my life   Depression May 2023   Had it just now got medicine   GERD (gastroesophageal reflux disease)    Since i startedthe medicines   Hypertension    Neuromuscular disorder (HCC)    Nerves really bad   Sickle cell anemia (HCC)    White cells eating my blood cells    Past Surgical History: Past Surgical History:  Procedure Laterality Date   NO PAST SURGERIES      Allergies: Allergies as of 04/15/2024   (No Known Allergies)    Medications: Outpatient Encounter Medications as of 04/15/2024  Medication Sig   amLODipine   (NORVASC ) 5 MG tablet Take 1 tablet (5 mg total) by mouth daily.   hydrOXYzine  (VISTARIL ) 25 MG capsule Take 1 capsule (25 mg total) by mouth 3 (three) times daily as needed.   medroxyPROGESTERone  Acetate 150 MG/ML SUSY INJECT 1 ML INTO THE MUSCLE EVERY 3 MONTHS   methylPREDNISolone  (MEDROL  DOSEPAK) 4 MG TBPK tablet Use as directed.   naproxen  (NAPROSYN ) 500 MG tablet Take 1 tablet (500 mg total) by mouth 2 (two) times daily with a meal.   rosuvastatin  (CRESTOR ) 10 MG tablet Take 1 tablet (10 mg total) by mouth daily.   Secukinumab , 300 MG Dose, (COSENTYX , 300 MG DOSE,) 150 MG/ML SOSY Inject 300 mg into the skin once a week.   sertraline  (ZOLOFT ) 100 MG tablet Take 1 tablet (100 mg total) by mouth daily.   [DISCONTINUED] sertraline  (ZOLOFT ) 25 MG tablet PLEASE SEE ATTACHED FOR DETAILED DIRECTIONS (Patient not taking: Reported on 12/03/2023)   No facility-administered encounter medications on file as of 04/15/2024.    Social History: Social History   Tobacco Use   Smoking status: Every Day    Current packs/day: 1.00    Average packs/day: 1.1 packs/day for 26.7 years (30.1 ttl pk-yrs)    Types: Cigarettes    Passive exposure: Current   Smokeless tobacco: Never  Vaping Use   Vaping status: Never Used  Substance Use Topics   Alcohol use: No   Drug use: No  Family Medical History: Family History  Problem Relation Age of Onset   Diabetes Paternal Grandmother    Hypertension Paternal Grandmother    Asthma Paternal Grandmother    COPD Paternal Grandmother    Hypertension Father    COPD Father    Hypertension Sister    Heart attack Sister    Heart disease Sister    Anorexia nervosa Sister    Bipolar disorder Sister    Anxiety disorder Sister    Anxiety disorder Daughter    Depression Daughter    Anxiety disorder Daughter    Depression Daughter    Asthma Daughter    Asthma Son    Hypertension Son    Learning disabilities Son    Hypertension Paternal Uncle    COPD Maternal  Grandfather    Asthma Maternal Grandmother    Anxiety disorder Sister    Arthritis Sister    Depression Sister    Heart disease Sister    Hypertension Sister    Arthritis Daughter    Learning disabilities Daughter     Physical Examination: Vitals:   04/15/24 1312  BP: 130/70    General: Patient is well developed, well nourished, calm, collected, and in no apparent distress. Attention to examination is appropriate.  Respiratory: Patient is breathing without any difficulty.   NEUROLOGICAL:     Awake, alert, oriented to person, place, and time.  Speech is clear and fluent. Fund of knowledge is appropriate.   Cranial Nerves: Pupils equal round and reactive to light.  Facial tone is symmetric.    No abnormal lesions on exposed skin.   Strength: Side Biceps Triceps Deltoid Interossei Grip Wrist Ext. Wrist Flex.  R 5 5 5 5 4  4- 4-  L 5 5 5 5 5 5 5    Side Iliopsoas Quads Hamstring PF DF EHL  R 5 5 5 5 5 5   L 5 5 5 5 5 5    Reflexes are 2+ and symmetric at the biceps, brachioradialis, patella and achilles.   Achilles DTR not tested on left at her request (has bruise).   Hoffman's is absent.  Clonus is not present.    Bilateral upper and lower extremity sensation is intact to light touch.     She has reasonable ROM of both shoulders with pain in right shoulder. She has pain with IR/ER of right shoulder. She has mild diffuse tenderness at right shoulder and right wrist.   Gait is normal.     Medical Decision Making  Imaging: Cervical xrays dated 04/06/24:  FINDINGS: The cervical spine is visualized from C1-the superior endplate C7. There is mild reversal of cervical lordosis centered at C5-6. Vertebral body heights are maintained: no evidence of acute fracture. Mild to moderate intervertebral disc space height loss at C5-6 with uncovertebral hypertrophy. Uncovertebral hypertrophy results mild to moderate bilateral osseous neuroforaminal narrowing at C5-6. No  prevertebral soft tissue swelling. Visualized thorax is unremarkable. Periodontal disease.   IMPRESSION: Mild to moderate degenerative changes at C5-6 with mild to moderate bilateral osseous neuroforaminal narrowing.     Electronically Signed   By: Rachel Orr M.D.   On: 04/07/2024 18:22  I have personally reviewed the images and agree with the above interpretation.  Assessment and Plan: Ms. Mathison has 2-3 week history of neck pain that radiates down right arm to the elbow. Current pain is more in right shoulder and right wrist. She has weakness in her right arm.   She has known cervical spondylosis and DDD C5-C6.  She has pain with ROM of right shoulder and wrist along with tenderness- I think a lot of her pain is from the wrist and shoulder.   She has weakness in right/wrist that could be more cervical mediated.   Treatment options discussed with patient and following plan made:   - MRI of cervical spine to further evaluate weakness in right wrist and hand. She request WIDE BORE MRI.  - Referral to ortho at [facility] for right wrist and shoulder pain.  - She can take OTC tylenol  as directed on bottle for pain. Reviewed dosing and side effects.  - Will schedule phone visit to review MRI results once I get them back.   I spent a total of 30 minutes in face-to-face and non-face-to-face activities related to this patient's care today including review of outside records, review of imaging, review of symptoms, physical exam, discussion of differential diagnosis, discussion of treatment options, and documentation.   Thank you for involving me in the care of this patient.   Lucetta Russel PA-C Dept. of Neurosurgery

## 2024-04-15 ENCOUNTER — Ambulatory Visit: Admitting: Orthopedic Surgery

## 2024-04-15 ENCOUNTER — Encounter: Payer: Self-pay | Admitting: Orthopedic Surgery

## 2024-04-15 VITALS — BP 130/70 | Ht 64.0 in | Wt 179.8 lb

## 2024-04-15 DIAGNOSIS — M4722 Other spondylosis with radiculopathy, cervical region: Secondary | ICD-10-CM

## 2024-04-15 DIAGNOSIS — M5030 Other cervical disc degeneration, unspecified cervical region: Secondary | ICD-10-CM

## 2024-04-15 DIAGNOSIS — R29898 Other symptoms and signs involving the musculoskeletal system: Secondary | ICD-10-CM | POA: Diagnosis not present

## 2024-04-15 DIAGNOSIS — M50322 Other cervical disc degeneration at C5-C6 level: Secondary | ICD-10-CM

## 2024-04-15 DIAGNOSIS — M5412 Radiculopathy, cervical region: Secondary | ICD-10-CM

## 2024-04-15 DIAGNOSIS — M25531 Pain in right wrist: Secondary | ICD-10-CM

## 2024-04-15 DIAGNOSIS — M25511 Pain in right shoulder: Secondary | ICD-10-CM

## 2024-04-15 DIAGNOSIS — M47812 Spondylosis without myelopathy or radiculopathy, cervical region: Secondary | ICD-10-CM

## 2024-04-15 NOTE — Patient Instructions (Signed)
 It was so nice to see you today. Thank you so much for coming in.    You have some wear and tear in your neck and this could be causing some of your pain.   I think a lot of your pain is from the right shoulder and the right wrist. The weakness in your right hand may be from your neck.   I want to get an MRI of your neck to look into things further. We will get this approved through your insurance and [facility] will call you to schedule the appointment. Ask about your patient responsibility. You do not need to pay this prior to getting MRI, they can bill you.   Make sure they schedule you for the WIDE BORE MRI.   After you have the MRI, it takes 14-28 days for me to get the results back. If you don't hear anything in 2 weeks then let me know.   Once I have them, we will call you to schedule a follow up phone visit with me to review them.   You can take over the counter tylenol  to help with pain and inflammation. Take as directed on the bottle with food.   I want you to see ortho at the [facility] clinic for evaluation of your right shoulder and wrist pain. They should call you to schedule an appointment or you can call them at [phone].   Please do not hesitate to call if you have any questions or concerns. You can also message me in MyChart.   Lucetta Russel PA-C [phone]     The physicians and staff at [facility] Neuro[facility] at [facility] are committed to providing excellent care. You may receive a survey asking for feedback about your experience at our office. We value you your feedback and appreciate you taking the time to to fill it out. The [facility] leadership team is also available to discuss your experience in person, feel free to contact us  [phone].

## 2024-04-19 ENCOUNTER — Encounter: Payer: Self-pay | Admitting: Psychiatry

## 2024-04-19 ENCOUNTER — Ambulatory Visit (INDEPENDENT_AMBULATORY_CARE_PROVIDER_SITE_OTHER): Admitting: Psychiatry

## 2024-04-19 ENCOUNTER — Other Ambulatory Visit: Payer: Self-pay

## 2024-04-19 VITALS — BP 119/83 | HR 99 | Temp 98.0°F | Ht 64.0 in | Wt 172.0 lb

## 2024-04-19 DIAGNOSIS — R41840 Attention and concentration deficit: Secondary | ICD-10-CM | POA: Insufficient documentation

## 2024-04-19 DIAGNOSIS — F4010 Social phobia, unspecified: Secondary | ICD-10-CM | POA: Insufficient documentation

## 2024-04-19 DIAGNOSIS — F439 Reaction to severe stress, unspecified: Secondary | ICD-10-CM | POA: Diagnosis not present

## 2024-04-19 DIAGNOSIS — F411 Generalized anxiety disorder: Secondary | ICD-10-CM | POA: Diagnosis not present

## 2024-04-19 MED ORDER — SERTRALINE HCL 100 MG PO TABS: 150.0000 mg | ORAL_TABLET | Freq: Every day | ORAL | 0 refills | Status: DC

## 2024-04-19 NOTE — Patient Instructions (Signed)
  [email or web address]  [email or web address]  [email or web address] [facility] [address], [place], [place] [address]   [phone]  [facility] [email or web address] [address], [place], [place] [address]  [phone]  [facility], [facility] [email or web address] [address] [place], [place] [address]  [phone]   [facility] - [phone]  [facility] - [phone]  [facility] - [phone] counseling [phone]  [facility] [phone]   Medicaid below :  [facility] [address] [place], [place] [address]  [phone]    Bel[facility] Chancy [address] [place], [place] [address]  [phone]    [facility] [address] [place], [place] [address]  [phone]
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
## 2024-04-19 NOTE — Progress Notes (Unsigned)
 error

## 2024-04-21 ENCOUNTER — Encounter: Admitting: Physical Medicine and Rehabilitation

## 2024-04-21 NOTE — Progress Notes (Deleted)
 Rachel Orr - 43 y.o. female MRN [id number]  Date of birth: [birth year]  Office Visit Note: Visit Date: 04/21/2024 PCP: Kotturi, Vinay K, MD Referred by: Onesimo Oneil LABOR, MD  Subjective: No chief complaint on file.  HPI: Rachel Orr is a 43 y.o. female who comes in today at the request of Dr. Oneil Onesimo for evaluation and management of chronic, worsening and severe pain, numbness and tingling in the Bilateral upper extremities.  Patient is Right hand dominant.  In addition, she has numbness and tingling in both hands.  Provocative testing demonstrates irritation of the median nerve.  This involves the fingers within the median nerve distribution.  I would like to obtain bilateral EMGs for further evaluation.  Follow-up once the testing is complete.     ROS Otherwise per HPI.  Assessment & Plan: Visit Diagnoses:    ICD-10-CM   1. Paresthesia of skin  R20.2        Plan: No additional findings.   Meds & Orders: No orders of the defined types were placed in this encounter.  No orders of the defined types were placed in this encounter.   Follow-up: No follow-ups on file.   Procedures: No procedures performed      Clinical History: No specialty comments available.   She reports that she has been smoking cigarettes. She has a 30.1 pack-year smoking history. She has been exposed to tobacco smoke. She has never used smokeless tobacco.  Recent Labs    04/06/24 1541  HGBA1C 5.5    Objective:  VS:  HT:    WT:   BMI:     BP:   HR: bpm  TEMP: ( )  RESP:  Physical Exam Vitals and nursing note reviewed.  Constitutional:      General: She is not in acute distress.    Appearance: Normal appearance. She is well-developed. She is not ill-appearing.  HENT:     Head: Normocephalic and atraumatic.   Eyes:     Conjunctiva/sclera: Conjunctivae normal.     Pupils: Pupils are equal, round, and reactive to light.    Cardiovascular:     Rate and Rhythm: Normal rate.      Pulses: Normal pulses.  Pulmonary:     Effort: Pulmonary effort is normal.   Musculoskeletal:        General: No swelling, tenderness or deformity.     Right lower leg: No edema.     Left lower leg: No edema.     Comments: Inspection reveals no atrophy of the bilateral APB or FDI or hand intrinsics. There is no swelling, color changes, allodynia or dystrophic changes. There is 5 out of 5 strength in the bilateral wrist extension, finger abduction and long finger flexion. There is intact sensation to light touch in all dermatomal and peripheral nerve distributions. There is a negative Froment's test bilaterally. There is a negative Tinel's test at the bilateral wrist and elbow. There is a negative Phalen's test bilaterally. There is a negative Hoffmann's test bilaterally.   Skin:    General: Skin is warm and dry.     Findings: No erythema or rash.   Neurological:     General: No focal deficit present.     Mental Status: She is alert and oriented to person, place, and time.     Sensory: No sensory deficit.     Motor: No weakness or abnormal muscle tone.     Coordination: Coordination normal.     Gait:  Gait normal.   Psychiatric:        Mood and Affect: Mood normal.        Behavior: Behavior normal.     Ortho Exam  Imaging: No results found.  Past Medical/Family/Surgical/Social History: Medications & Allergies reviewed per EMR, new medications updated. Patient Active Problem List   Diagnosis Date Noted   GAD (generalized anxiety disorder) 04/19/2024   Social anxiety disorder 04/19/2024   Trauma and stressor-related disorder 04/19/2024   Attention and concentration deficit 04/19/2024   Right shoulder pain 04/07/2024   Pain of finger of right hand 02/25/2024   Psoriasis 01/12/2024   Leukocytosis 12/31/2023   Hypertension 12/03/2023   Routine general medical examination at a health care facility 02/24/2023   Rash 02/24/2023   Encounter for well woman exam with routine  gynecological exam 02/18/2022   Encounter for surveillance of injectable contraceptive 02/18/2022   Eczema 02/18/2022   Anxiety and depression 02/18/2022   Bilateral wrist pain 02/18/2022   Bilateral carpal tunnel syndrome 02/18/2022   Smoker 12/22/2019   Past Medical History:  Diagnosis Date   Anemia    Anxiety    All my life   Arthritis    Most of my life   Depression May 2023   Had it just now got medicine   GERD (gastroesophageal reflux disease)    Since i startedthe medicines   HLD (hyperlipidemia)    Hypertension    Neuromuscular disorder (HCC)    Nerves really bad   Sickle cell anemia (HCC)    White cells eating my blood cells   Family History  Problem Relation Age of Onset   Hypertension Father    COPD Father    Hypertension Sister    Heart attack Sister    Heart disease Sister    Anorexia nervosa Sister    Bipolar disorder Sister    Anxiety disorder Sister    Anxiety disorder Sister    Arthritis Sister    Depression Sister    Heart disease Sister    Hypertension Sister    Hypertension Paternal Uncle    COPD Maternal Grandfather    Asthma Maternal Grandmother    Diabetes Paternal Grandmother    Hypertension Paternal Grandmother    Asthma Paternal Grandmother    COPD Paternal Grandmother    Anxiety disorder Daughter    Depression Daughter    Depression Daughter    Anxiety disorder Daughter    Intellectual disability Daughter    Asthma Daughter    Arthritis Daughter    Learning disabilities Daughter    Asthma Son    Hypertension Son    Learning disabilities Son    Past Surgical History:  Procedure Laterality Date   NO PAST SURGERIES     Social History   Occupational History   Not on file  Tobacco Use   Smoking status: Every Day    Current packs/day: 1.00    Average packs/day: 1.1 packs/day for 26.7 years (30.1 ttl pk-yrs)    Types: Cigarettes    Passive exposure: Current   Smokeless tobacco: Never  Vaping Use   Vaping status: Some Days   Substance and Sexual Activity   Alcohol use: No   Drug use: No   Sexual activity: Not Currently    Birth control/protection: Injection

## 2024-04-27 ENCOUNTER — Ambulatory Visit: Admitting: *Deleted

## 2024-04-27 DIAGNOSIS — Z3042 Encounter for surveillance of injectable contraceptive: Secondary | ICD-10-CM | POA: Diagnosis not present

## 2024-04-27 MED ORDER — MEDROXYPROGESTERONE ACETATE 150 MG/ML IM SUSY
150.0000 mg | PREFILLED_SYRINGE | Freq: Once | INTRAMUSCULAR | Status: AC
  Administered 2024-04-27: 150 mg via INTRAMUSCULAR

## 2024-04-27 NOTE — Progress Notes (Signed)
   NURSE VISIT- INJECTION  SUBJECTIVE:  Rachel Orr is a 43 y.o. [id number] female here for a Depo Provera  for contraception/period management. She is a GYN patient.   OBJECTIVE:  There were no vitals taken for this visit.  Appears well, in no apparent distress  Injection administered in: Left deltoid  Meds ordered this encounter  Medications   medroxyPROGESTERone  Acetate SUSY 150 mg    ASSESSMENT: GYN patient Depo Provera  for contraception/period management PLAN: Follow-up: in 11-13 weeks for next Depo. Pt needs refills on Depo.    Rachel Orr  04/27/2024 4:03 PM

## 2024-05-05 ENCOUNTER — Encounter: Payer: Self-pay | Admitting: Psychology

## 2024-05-09 ENCOUNTER — Other Ambulatory Visit: Payer: Self-pay | Admitting: Adult Health

## 2024-05-10 ENCOUNTER — Ambulatory Visit: Admission: RE | Admit: 2024-05-10 | Source: Ambulatory Visit

## 2024-05-25 ENCOUNTER — Encounter: Admitting: Physical Medicine and Rehabilitation

## 2024-05-26 ENCOUNTER — Ambulatory Visit: Admitting: Adult Health

## 2024-06-03 ENCOUNTER — Telehealth: Payer: Self-pay

## 2024-06-03 NOTE — Telephone Encounter (Signed)
 Patient called, she would like to stop Cosentyx  and start oral tablets for her Psoriasis, she was living with her sister who was giving her Cosentyx  injections but now she has moved out, so she will not have anybody to give her injections.  Discussed with patient she will need an appointment with Dr Claudene to discuss changing her treatment plan, patient report she will wait until her upcoming appt in Sept to discuss with Dr Claudene, she did not want to come in the office any sooner

## 2024-06-07 ENCOUNTER — Encounter: Payer: Self-pay | Admitting: Psychiatry

## 2024-06-07 ENCOUNTER — Telehealth: Admitting: Psychiatry

## 2024-06-07 DIAGNOSIS — R41840 Attention and concentration deficit: Secondary | ICD-10-CM

## 2024-06-07 DIAGNOSIS — F411 Generalized anxiety disorder: Secondary | ICD-10-CM

## 2024-06-07 DIAGNOSIS — F439 Reaction to severe stress, unspecified: Secondary | ICD-10-CM

## 2024-06-07 DIAGNOSIS — F4010 Social phobia, unspecified: Secondary | ICD-10-CM | POA: Diagnosis not present

## 2024-06-07 NOTE — Progress Notes (Signed)
 Virtual Visit via Video Note  I connected with Rachel Orr on 06/07/24 at  3:00 PM EDT by a video enabled telemedicine application and verified that I am speaking with the correct person using two identifiers.  Location Provider Location : [facility] Patient Location : Car  Participants: Patient , Provider   I discussed the limitations of evaluation and management by telemedicine and the availability of in person appointments. The patient expressed understanding and agreed to proceed.   I discussed the assessment and treatment plan with the patient. The patient was provided an opportunity to ask questions and all were answered. The patient agreed with the plan and demonstrated an understanding of the instructions.   The patient was advised to call back or seek an in-person evaluation if the symptoms worsen or if the condition fails to improve as anticipated.   BH MD OP Progress Note  06/08/2024 10:49 AM Rachel Orr  MRN:  [id number]  Chief Complaint:  Chief Complaint  Patient presents with   Follow-up   Anxiety   Medication Refill   Depression   Discussed the use of AI scribe software for clinical note transcription with the patient, who gave verbal consent to proceed.  History of Present Illness Patient is a 43 year old Caucasian female currently on disability, single, has a history of generalized anxiety disorder, social anxiety disorder, trauma and stress related disorder, attention and concentration deficit was evaluated by telemedicine today..  She reports that her anxiety symptoms are pretty maintained at this time. She describes no sleep disturbances and denies experiencing irritability as she had previously. She denies any hallucinations, including hearing voices or seeing things.  Her current regimen includes sertraline  150 mg, which she increased at her last visit. She reports doing well on this higher dose and notes no concerns or side effects. She has not required  any medication refills since the last visit.  She has not yet established care with a therapist for psychotherapy, stating that the therapy provider has not contacted her to schedule an appointment and that she was told to call back in September. She has an upcoming ADHD testing appointment scheduled in September .  Regarding appetite, she describes it as poor, stating that she sometimes eats only once per day and does not consistently eat 2 meals daily.  She denies any suicidality or homicidality.      Visit Diagnosis:    ICD-10-CM   1. GAD (generalized anxiety disorder)  F41.1     2. Social anxiety disorder  F40.10     3. Trauma and stressor-related disorder  F43.9    Rule out PTSD    4. Attention and concentration deficit  R41.840       Past Psychiatric History: I have reviewed past psychiatric history from progress note on 04/19/2024.  Past trials of BuSpar , Lexapro .  Past Medical History:  Past Medical History:  Diagnosis Date   Anemia    Anxiety    All my life   Arthritis    Most of my life   Depression May 2023   Had it just now got medicine   GERD (gastroesophageal reflux disease)    Since i startedthe medicines   HLD (hyperlipidemia)    Hypertension    Neuromuscular disorder (HCC)    Nerves really bad   Sickle cell anemia (HCC)    White cells eating my blood cells    Past Surgical History:  Procedure Laterality Date   NO PAST SURGERIES  Family Psychiatric History: I have reviewed family psychiatric history from progress note on 04/19/2024.  Family History:  Family History  Problem Relation Age of Onset   Hypertension Father    COPD Father    Hypertension Sister    Heart attack Sister    Heart disease Sister    Anorexia nervosa Sister    Bipolar disorder Sister    Anxiety disorder Sister    Anxiety disorder Sister    Arthritis Sister    Depression Sister    Heart disease Sister    Hypertension Sister    Hypertension Paternal Uncle     COPD Maternal Grandfather    Asthma Maternal Grandmother    Diabetes Paternal Grandmother    Hypertension Paternal Grandmother    Asthma Paternal Grandmother    COPD Paternal Grandmother    Anxiety disorder Daughter    Depression Daughter    Depression Daughter    Anxiety disorder Daughter    Intellectual disability Daughter    Asthma Daughter    Arthritis Daughter    Learning disabilities Daughter    Asthma Son    Hypertension Son    Learning disabilities Son     Social History: I have reviewed social history from progress note on 04/19/2024. Social History   Socioeconomic History   Marital status: Single    Spouse name: Not on file   Number of children: 5   Years of education: Not on file   Highest education level: 10th grade  Occupational History   Not on file  Tobacco Use   Smoking status: Every Day    Current packs/day: 1.00    Average packs/day: 1.1 packs/day for 26.7 years (30.1 ttl pk-yrs)    Types: Cigarettes    Passive exposure: Current   Smokeless tobacco: Never  Vaping Use   Vaping status: Some Days  Substance and Sexual Activity   Alcohol use: No   Drug use: No   Sexual activity: Not Currently    Birth control/protection: Injection  Other Topics Concern   Not on file  Social History Narrative   Not on file   Social Drivers of Health   Financial Resource Strain: Medium Risk (04/01/2024)   Overall Financial Resource Strain (CARDIA)    Difficulty of Paying Living Expenses: Somewhat hard  Food Insecurity: Food Insecurity Present (04/01/2024)   Hunger Vital Sign    Worried About Running Out of Food in the Last Year: Often true    Ran Out of Food in the Last Year: Often true  Transportation Needs: No Transportation Needs (04/01/2024)   PRAPARE - Administrator, Civil Service (Medical): No    Lack of Transportation (Non-Medical): No  Physical Activity: Sufficiently Active (04/01/2024)   Exercise Vital Sign    Days of Exercise per Week: 7 days     Minutes of Exercise per Session: 150+ min  Stress: Stress Concern Present (04/01/2024)   [employer] of Occupational Health - Occupational Stress Questionnaire    Feeling of Stress : To some extent  Social Connections: Moderately Isolated (04/01/2024)   Social Connection and Isolation Panel    Frequency of Communication with Friends and Family: More than three times a week    Frequency of Social Gatherings with Friends and Family: Never    Attends Religious Services: More than 4 times per year    Active Member of [employer] or Organizations: No    Attends Banker Meetings: Not on file    Marital Status: Never  married    Allergies: No Known Allergies  Metabolic Disorder Labs: Lab Results  Component Value Date   HGBA1C 5.5 04/06/2024   No results found for: PROLACTIN Lab Results  Component Value Date   CHOL 265 (H) 04/06/2024   TRIG 213 (H) 04/06/2024   HDL 36 (L) 04/06/2024   CHOLHDL 7.4 (H) 04/06/2024   LDLCALC 188 (H) 04/06/2024   Lab Results  Component Value Date   TSH 1.610 04/06/2024   TSH 1.400 02/24/2023    Therapeutic Level Labs: No results found for: LITHIUM No results found for: VALPROATE No results found for: CBMZ  Current Medications: Current Outpatient Medications  Medication Sig Dispense Refill   amLODipine  (NORVASC ) 5 MG tablet Take 1 tablet (5 mg total) by mouth daily. 30 tablet 6   aspirin 81 MG chewable tablet Chew 81 mg by mouth daily.     co-enzyme Q-10 30 MG capsule Take 100 mg by mouth daily.     COSENTYX  UNOREADY 300 MG/2ML SOAJ      hydrOXYzine  (VISTARIL ) 25 MG capsule Take 1 capsule by mouth three times daily as needed 30 capsule 1   medroxyPROGESTERone  Acetate 150 MG/ML SUSY INJECT 1 ML INTO THE MUSCLE EVERY 3 MONTHS 1 mL 4   methylPREDNISolone  (MEDROL  DOSEPAK) 4 MG TBPK tablet Use as directed. 21 each 0   naproxen  (NAPROSYN ) 500 MG tablet Take 1 tablet (500 mg total) by mouth 2 (two) times daily with a meal. 30 tablet 0    rosuvastatin  (CRESTOR ) 10 MG tablet Take 1 tablet (10 mg total) by mouth daily. 90 tablet 3   Secukinumab , 300 MG Dose, (COSENTYX , 300 MG DOSE,) 150 MG/ML SOSY Inject 300 mg into the skin once a week.     sertraline  (ZOLOFT ) 100 MG tablet Take 1.5 tablets (150 mg total) by mouth daily. 135 tablet 0   No current facility-administered medications for this visit.     Musculoskeletal: Strength & Muscle Tone: UTA Gait & Station: normal Patient leans: N/A  Psychiatric Specialty Exam: Review of Systems  Psychiatric/Behavioral:  Positive for decreased concentration. The patient is nervous/anxious.     There were no vitals taken for this visit.There is no height or weight on file to calculate BMI.  General Appearance: Casual  Eye Contact:  Fair  Speech:  Normal Rate  Volume:  Normal  Mood:  Anxious  Affect:  Congruent  Thought Process:  Goal Directed and Descriptions of Associations: Intact  Orientation:  Full (Time, Place, and Person)  Thought Content: Logical   Suicidal Thoughts:  No  Homicidal Thoughts:  No  Memory:  Immediate;   Fair Recent;   Fair Remote;   Fair  Judgement:  Fair  Insight:  Fair  Psychomotor Activity:  Normal  Concentration:  Concentration: Fair and Attention Span: Fair  Recall:  [employer] of Knowledge: Fair  Language: Fair  Akathisia:  No  Handed:  Right  AIMS (if indicated): not done  Assets:  Communication Skills Desire for Improvement Housing Social Support  ADL's:  Intact  Cognition: WNL  Sleep:  improving   Screenings: GAD-7    Flowsheet Row Office Visit from 04/19/2024 in [facility] Office Visit from 04/06/2024 in [facility] at [facility] Office Visit from 02/25/2024 in [facility] for [facility] Healthcare at [facility] Office Visit from 12/31/2023 in [facility] at [facility] Office Visit from 12/03/2023 in [facility] for  [facility] Healthcare at [facility]  Total GAD-7 Score 18 17 18 14 18    PHQ2-9    Flowsheet Row Office Visit from 04/19/2024 in [facility] Office Visit from 04/06/2024 in [facility] Primary Care & Sports Medicine at [facility] Office Visit from 02/25/2024 in [facility] for [facility] at [facility] Office Visit from 12/31/2023 in [facility] for [facility] Healthcare at [facility] Office Visit from 12/03/2023 in [facility] for Women's Healthcare at [facility]  PHQ-2 Total Score 0 6 4 4 6   PHQ-9 Total Score -- 21 21 12 22    Flowsheet Row Video Visit from 06/07/2024 in [facility] Office Visit from 04/19/2024 in [facility] ED from 04/05/2024 in [facility] Department at [facility]  C-SSRS RISK CATEGORY No Risk No Risk No Risk     Assessment and Plan: JISELL MAJER is a 43 year old Caucasian female who has a history of anxiety, trauma related symptoms, attention and focus deficit was evaluated by telemedicine today.  Discussed assessment and plan as noted below.  Trauma and stress related disorder, rule out PTSD-improving Generalized anxiety disorder-improving Social anxiety disorder-unstable Currently reports good response to the sertraline  although continues to struggle with appetite.  She reports sleep and mood symptoms have improved.  Has not been able to establish care with trauma focused therapist however is motivated to do so and is awaiting a call back from her previous therapist for the same. Continue Sertraline  150 mg daily Continue Hydroxyzine  25 mg 3 times a day as needed Encouraged to start trauma focused therapy. Patient to monitor her eating habits, will reevaluate in future sessions.  Attention and concentration deficit-referred to neuropsychologist, pending ADHD testing.  Follow-up Follow-up in clinic in 1  month or sooner in person.   Collaboration of Care: Collaboration of Care: Referral or follow-up with counselor/therapist AEB patient encouraged to establish care with therapist.  Patient/Guardian was advised Release of Information must be obtained prior to any record release in order to collaborate their care with an outside provider. Patient/Guardian was advised if they have not already done so to contact the registration department to sign all necessary forms in order for us  to release information regarding their care.   Consent: Patient/Guardian gives verbal consent for treatment and assignment of benefits for services provided during this visit. Patient/Guardian expressed understanding and agreed to proceed.  This note was generated in part or whole with voice recognition software. Voice recognition is usually quite accurate but there are transcription errors that can and very often do occur. I apologize for any typographical errors that were not detected and corrected.     Django Nguyen, MD 06/08/2024, 10:56 AM

## 2024-07-01 ENCOUNTER — Encounter: Admitting: Psychology

## 2024-07-08 ENCOUNTER — Ambulatory Visit: Admitting: Family Medicine

## 2024-07-13 ENCOUNTER — Ambulatory Visit: Admitting: Psychiatry

## 2024-07-13 ENCOUNTER — Ambulatory Visit: Admitting: Family Medicine

## 2024-07-13 ENCOUNTER — Ambulatory Visit: Admitting: Dermatology

## 2024-07-19 ENCOUNTER — Encounter: Payer: Self-pay | Admitting: Adult Health

## 2024-07-19 ENCOUNTER — Ambulatory Visit: Admitting: Adult Health

## 2024-07-19 VITALS — BP 124/84 | HR 86 | Ht 64.0 in | Wt 170.5 lb

## 2024-07-19 DIAGNOSIS — R5383 Other fatigue: Secondary | ICD-10-CM | POA: Diagnosis not present

## 2024-07-19 DIAGNOSIS — I10 Essential (primary) hypertension: Secondary | ICD-10-CM | POA: Diagnosis not present

## 2024-07-19 LAB — POCT HEMOGLOBIN: Hemoglobin: 14.4 g/dL (ref 11–14.6)

## 2024-07-19 NOTE — Progress Notes (Signed)
  Subjective:     Patient ID: Rachel Orr, female   DOB: 04-Jan-1981, 43 y.o.   MRN: [id number]  HPI Rachel Orr is a 43 year old white female,single, G5P5005 in complaining of fatigue, wants to sleep all the time, is not depressed.Winders if iron low. She had normal TSH and A1c in June.     Component Value Date/Time   DIAGPAP  02/24/2023 1514    - Negative for intraepithelial lesion or malignancy (NILM)   DIAGPAP  03/01/2020 1045    - Negative for intraepithelial lesion or malignancy (NILM)   HPVHIGH Negative 02/24/2023 1514   HPVHIGH Negative 03/01/2020 1045   ADEQPAP  02/24/2023 1514    Satisfactory for evaluation; transformation zone component PRESENT.   ADEQPAP  03/01/2020 1045    Satisfactory for evaluation; transformation zone component ABSENT.   PCP is Dr Sol  Review of Systems +fatigue wants to sleep all the time Denies being depressed Reviewed past medical,surgical, social and family history. Reviewed medications and allergies.     Objective:   Physical Exam BP 124/84 (BP Location: Right Arm, Patient Position: Sitting, Cuff Size: Normal)   Pulse 86   Ht 5' 4 (1.626 m)   Wt 170 lb 8 oz (77.3 kg)   BMI 29.27 kg/m  POC HGB was 14.4 Skin warm and dry.  Lungs: clear to ausculation bilaterally. Cardiovascular: regular rate and rhythm. Fall risk is low  Upstream - 07/19/24 1506       Pregnancy Intention Screening   Does the patient want to become pregnant in the next year? No    Does the patient's partner want to become pregnant in the next year? No    Would the patient like to discuss contraceptive options today? No      Contraception Wrap Up   Current Method Hormonal Injection    End Method Hormonal Injection    Contraception Counseling Provided Yes              Assessment:     1. Fatigue, unspecified type (Primary) Wants to sleep all the time POC HGB was 14.4 Will check iron panel  Will talk when results back  - POCT hemoglobin - Iron, TIBC and  Ferritin Panel  2. Hypertension, unspecified type Take BP meds and follow up with PCP    Plan:     Follow up prn

## 2024-07-20 ENCOUNTER — Ambulatory Visit: Payer: Self-pay | Admitting: Adult Health

## 2024-07-20 ENCOUNTER — Other Ambulatory Visit: Payer: Self-pay | Admitting: Adult Health

## 2024-07-20 ENCOUNTER — Ambulatory Visit (INDEPENDENT_AMBULATORY_CARE_PROVIDER_SITE_OTHER)

## 2024-07-20 DIAGNOSIS — Z3042 Encounter for surveillance of injectable contraceptive: Secondary | ICD-10-CM

## 2024-07-20 LAB — IRON,TIBC AND FERRITIN PANEL
Ferritin: 51 ng/mL (ref 15–150)
Iron Saturation: 23 % (ref 15–55)
Iron: 75 ug/dL (ref 27–159)
Total Iron Binding Capacity: 322 ug/dL (ref 250–450)
UIBC: 247 ug/dL (ref 131–425)

## 2024-07-20 MED ORDER — MEDROXYPROGESTERONE ACETATE 150 MG/ML IM SUSY
150.0000 mg | PREFILLED_SYRINGE | Freq: Once | INTRAMUSCULAR | Status: AC
  Administered 2024-07-20: 150 mg via INTRAMUSCULAR

## 2024-07-20 NOTE — Progress Notes (Signed)
   NURSE VISIT- INJECTION  SUBJECTIVE:  Rachel Orr is a 43 y.o. [id number] female here for a Depo Provera  for contraception/period management. She is a GYN patient.   OBJECTIVE:  There were no vitals taken for this visit.  Appears well, in no apparent distress  Injection administered in: Right deltoid  Meds ordered this encounter  Medications   medroxyPROGESTERone  Acetate SUSY 150 mg    ASSESSMENT: GYN patient Depo Provera  for contraception/period management PLAN: Follow-up: in 11-13 weeks for next Depo   Aleck FORBES Blase  07/20/2024 3:52 PM

## 2024-07-22 ENCOUNTER — Ambulatory Visit: Admitting: Psychiatry

## 2024-07-23 ENCOUNTER — Other Ambulatory Visit: Payer: Self-pay | Admitting: Family Medicine

## 2024-07-23 ENCOUNTER — Other Ambulatory Visit: Payer: Self-pay | Admitting: Adult Health

## 2024-07-23 DIAGNOSIS — F4010 Social phobia, unspecified: Secondary | ICD-10-CM

## 2024-07-23 NOTE — Telephone Encounter (Signed)
 Requested medications are due for refill today. Unsure  Requested medications are on the active medications list.  Please see note  Last refill.   Future visit scheduled.   yes  Notes to clinic.  Re fill request has pt taking 1 pill per day  - med sheet has 1.5 per day. Rx signed by Dr. Coby     Requested Prescriptions  Pending Prescriptions Disp Refills   sertraline  (ZOLOFT ) 100 MG tablet [Pharmacy Med Name: Sertraline  HCl 100 MG Oral Tablet] 90 tablet 0    Sig: Take 1 tablet by mouth once daily     Psychiatry:  Antidepressants - SSRI - sertraline  Passed - 07/23/2024  5:36 PM      Passed - AST in normal range and within 360 days    AST  Date Value Ref Range Status  01/13/2024 20 0 - 40 IU/L Final   SGOT(AST)  Date Value Ref Range Status  01/28/2013 20 15 - 37 Unit/L Final         Passed - ALT in normal range and within 360 days    ALT  Date Value Ref Range Status  01/13/2024 18 0 - 32 IU/L Final   SGPT (ALT)  Date Value Ref Range Status  01/28/2013 22 12 - 78 U/L Final         Passed - Completed PHQ-2 or PHQ-9 in the last 360 days      Passed - Valid encounter within last 6 months    Recent Outpatient Visits           3 months ago Right shoulder pain, unspecified chronicity   [place] [facility] at [facility], Vinay K, MD       Future Appointments             In 3 days Hayden Lye [facility]   In 1 week Claudene Lehmann, MD [facility]

## 2024-07-26 ENCOUNTER — Encounter: Attending: Psychology | Admitting: Psychology

## 2024-07-26 NOTE — Telephone Encounter (Signed)
 Please review medication refill request

## 2024-07-27 ENCOUNTER — Ambulatory Visit: Admitting: Psychiatry

## 2024-07-29 ENCOUNTER — Ambulatory Visit: Admitting: Family Medicine

## 2024-08-02 ENCOUNTER — Ambulatory Visit: Admitting: Dermatology

## 2024-08-03 ENCOUNTER — Encounter: Payer: Self-pay | Admitting: Family Medicine

## 2024-08-03 ENCOUNTER — Ambulatory Visit: Admitting: Family Medicine

## 2024-08-18 ENCOUNTER — Ambulatory Visit: Admitting: Psychiatry

## 2024-08-29 ENCOUNTER — Ambulatory Visit (HOSPITAL_COMMUNITY): Admission: RE | Admit: 2024-08-29 | Source: Ambulatory Visit

## 2024-08-30 ENCOUNTER — Encounter: Payer: Self-pay | Admitting: Radiology

## 2024-08-31 ENCOUNTER — Ambulatory Visit: Admitting: Orthopedic Surgery

## 2024-09-02 ENCOUNTER — Other Ambulatory Visit: Payer: Self-pay | Admitting: Adult Health

## 2024-10-12 ENCOUNTER — Ambulatory Visit

## 2024-10-25 ENCOUNTER — Other Ambulatory Visit: Payer: Self-pay | Admitting: Adult Health

## 2024-10-25 ENCOUNTER — Ambulatory Visit: Admitting: *Deleted

## 2024-10-25 ENCOUNTER — Telehealth: Payer: Self-pay | Admitting: Adult Health

## 2024-10-25 DIAGNOSIS — R350 Frequency of micturition: Secondary | ICD-10-CM | POA: Diagnosis not present

## 2024-10-25 DIAGNOSIS — R3915 Urgency of urination: Secondary | ICD-10-CM

## 2024-10-25 DIAGNOSIS — R300 Dysuria: Secondary | ICD-10-CM

## 2024-10-25 DIAGNOSIS — Z3042 Encounter for surveillance of injectable contraceptive: Secondary | ICD-10-CM

## 2024-10-25 LAB — POCT URINALYSIS DIPSTICK OB
Glucose, UA: NEGATIVE
Ketones, UA: NEGATIVE
Nitrite, UA: POSITIVE
POC,PROTEIN,UA: NEGATIVE

## 2024-10-25 MED ORDER — MEDROXYPROGESTERONE ACETATE 150 MG/ML IM SUSY
150.0000 mg | PREFILLED_SYRINGE | Freq: Once | INTRAMUSCULAR | Status: AC
  Administered 2024-10-25: 150 mg via INTRAMUSCULAR

## 2024-10-25 MED ORDER — SULFAMETHOXAZOLE-TRIMETHOPRIM 800-160 MG PO TABS: 1.0000 | ORAL_TABLET | Freq: Two times a day (BID) | ORAL | 0 refills | Status: AC

## 2024-10-25 NOTE — Telephone Encounter (Signed)
 Pt called about med order not available at [facility].

## 2024-10-25 NOTE — Progress Notes (Signed)
" ° °  NURSE VISIT- UTI SYMPTOMS/Injection  SUBJECTIVE:  Rachel Orr is a 43 y.o. [id number] female here for UTI symptoms. She is a GYN patient. She reports dysuria, urinary frequency, and urinary urgency. Also here for Depo injection.  OBJECTIVE:  There were no vitals taken for this visit.  Appears well, in no apparent distress  Results for orders placed or performed in visit on 10/25/24 (from the past 24 hours)  POC Urinalysis Dipstick OB   Collection Time: 10/25/24  3:07 PM  Result Value Ref Range   Color, UA     Clarity, UA     Glucose, UA Negative Negative   Bilirubin, UA     Ketones, UA neg    Spec Grav, UA     Blood, UA trace    pH, UA     POC,PROTEIN,UA Negative Negative, Trace, Small (1+), Moderate (2+), Large (3+), 4+   Urobilinogen, UA     Nitrite, UA positive    Leukocytes, UA Trace (A) Negative   Appearance     Odor      ASSESSMENT: GYN patient with UTI symptoms and positive nitrites Depo injection  PLAN: Note routed to Delon Lewis, NP   Rx sent by provider today: Yes Urine culture sent Call or return to clinic prn if these symptoms worsen or fail to improve as anticipated. Depo provera  given in right deltoid Follow-up: as scheduled for next depo   Rutherford Rover  10/25/2024 3:11 PM  "

## 2024-10-25 NOTE — Progress Notes (Signed)
 Rx sent for septa ds

## 2024-10-26 ENCOUNTER — Ambulatory Visit: Payer: Self-pay | Admitting: Adult Health

## 2024-10-26 LAB — URINALYSIS, ROUTINE W REFLEX MICROSCOPIC
Bilirubin, UA: NEGATIVE
Glucose, UA: NEGATIVE
Nitrite, UA: POSITIVE — AB
RBC, UA: NEGATIVE
Specific Gravity, UA: 1.024 (ref 1.005–1.030)
Urobilinogen, Ur: 1 mg/dL (ref 0.2–1.0)
pH, UA: 5.5 (ref 5.0–7.5)

## 2024-10-26 LAB — MICROSCOPIC EXAMINATION: Casts: NONE SEEN /LPF

## 2024-10-31 LAB — URINE CULTURE

## 2024-11-04 ENCOUNTER — Other Ambulatory Visit: Payer: Self-pay | Admitting: Family Medicine

## 2024-11-04 DIAGNOSIS — F4010 Social phobia, unspecified: Secondary | ICD-10-CM

## 2024-11-05 NOTE — Telephone Encounter (Signed)
 Requested medication (s) are due for refill today: Yes  Requested medication (s) are on the active medication list: Yes  Last refill:  07/26/24  Future visit scheduled: No  Notes to clinic:  Medication d/c    Requested Prescriptions  Pending Prescriptions Disp Refills   sertraline  (ZOLOFT ) 100 MG tablet [Pharmacy Med Name: Sertraline  HCl 100 MG Oral Tablet] 90 tablet 0    Sig: Take 1 tablet by mouth once daily     Psychiatry:  Antidepressants - SSRI - sertraline  Failed - 11/05/2024 10:20 AM      Failed - Valid encounter within last 6 months    Recent Outpatient Visits           7 months ago Right shoulder pain, unspecified chronicity   [place] [facility] at [facility], Mackey POUR, MD              Passed - AST in normal range and within 360 days    AST  Date Value Ref Range Status  01/13/2024 20 0 - 40 IU/L Final   SGOT(AST)  Date Value Ref Range Status  01/28/2013 20 15 - 37 Unit/L Final         Passed - ALT in normal range and within 360 days    ALT  Date Value Ref Range Status  01/13/2024 18 0 - 32 IU/L Final   SGPT (ALT)  Date Value Ref Range Status  01/28/2013 22 12 - 78 U/L Final         Passed - Completed PHQ-2 or PHQ-9 in the last 360 days

## 2024-12-27 ENCOUNTER — Ambulatory Visit: Admitting: Obstetrics & Gynecology
